# Patient Record
Sex: Female | Born: 1980 | Race: White | Hispanic: No | Smoking: Former smoker
Health system: Southern US, Community
[De-identification: ages and names within clinical notes are randomized; demographics above are authoritative.]

## PROBLEM LIST (undated history)

## (undated) ENCOUNTER — Inpatient Hospital Stay: Payer: Self-pay

## (undated) DIAGNOSIS — Z5189 Encounter for other specified aftercare: Secondary | ICD-10-CM

## (undated) DIAGNOSIS — R102 Pelvic and perineal pain unspecified side: Secondary | ICD-10-CM

## (undated) DIAGNOSIS — K5792 Diverticulitis of intestine, part unspecified, without perforation or abscess without bleeding: Secondary | ICD-10-CM

## (undated) DIAGNOSIS — M419 Scoliosis, unspecified: Secondary | ICD-10-CM

## (undated) DIAGNOSIS — N83209 Unspecified ovarian cyst, unspecified side: Secondary | ICD-10-CM

## (undated) DIAGNOSIS — F329 Major depressive disorder, single episode, unspecified: Secondary | ICD-10-CM

## (undated) DIAGNOSIS — F32A Depression, unspecified: Secondary | ICD-10-CM

## (undated) DIAGNOSIS — K5790 Diverticulosis of intestine, part unspecified, without perforation or abscess without bleeding: Secondary | ICD-10-CM

## (undated) DIAGNOSIS — F419 Anxiety disorder, unspecified: Secondary | ICD-10-CM

## (undated) DIAGNOSIS — J329 Chronic sinusitis, unspecified: Secondary | ICD-10-CM

## (undated) HISTORY — PX: SPINE SURGERY: SHX786

## (undated) HISTORY — DX: Encounter for other specified aftercare: Z51.89

## (undated) HISTORY — DX: Major depressive disorder, single episode, unspecified: F32.9

## (undated) HISTORY — DX: Depression, unspecified: F32.A

## (undated) HISTORY — DX: Pelvic and perineal pain unspecified side: R10.20

## (undated) HISTORY — DX: Chronic sinusitis, unspecified: J32.9

## (undated) HISTORY — DX: Scoliosis, unspecified: M41.9

## (undated) HISTORY — DX: Pelvic and perineal pain: R10.2

---

## 2005-08-20 ENCOUNTER — Emergency Department: Payer: Self-pay | Admitting: Emergency Medicine

## 2006-02-07 ENCOUNTER — Emergency Department: Payer: Self-pay | Admitting: Unknown Physician Specialty

## 2006-08-05 ENCOUNTER — Ambulatory Visit: Payer: Self-pay

## 2006-08-05 IMAGING — US US OB US >=[ID] SNGL FETUS
1 series · 17 of 28 positions shown · non-contrast
Comparison: none

REASON FOR EXAM: size less than dates
COMMENTS:

[Series 1: us ob us >=(id) sngl fetus · 17 of 61 slices shown]
[im 1/61]
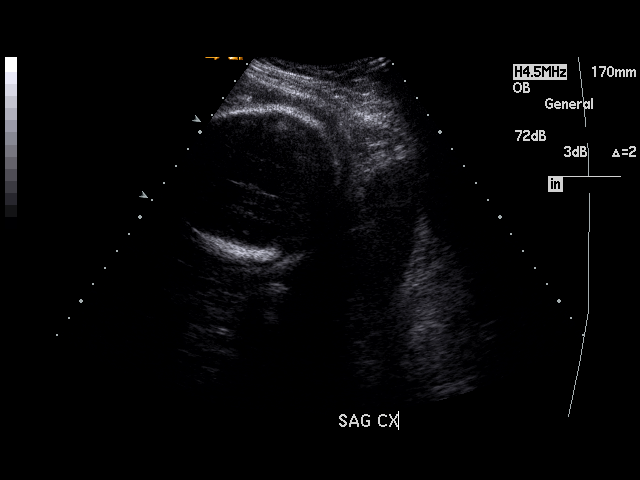
[im 5/61]
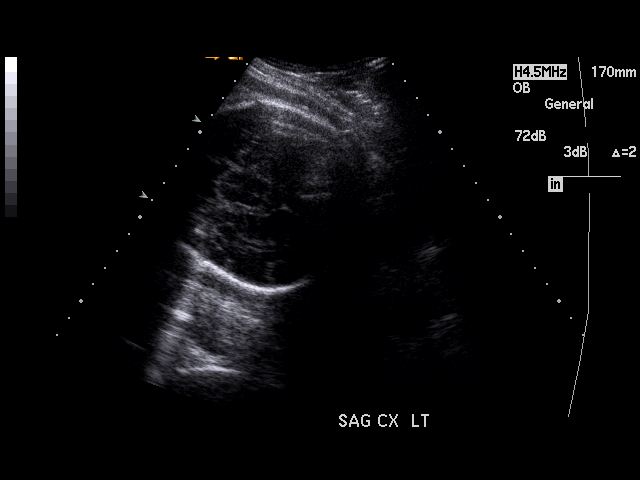
[im 9/61]
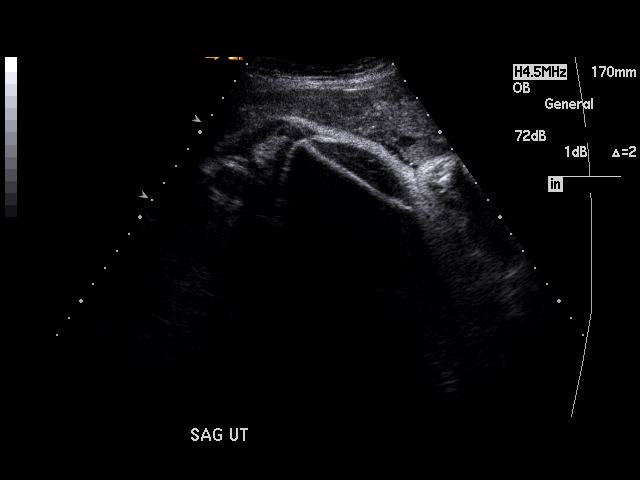
[im 12/61]
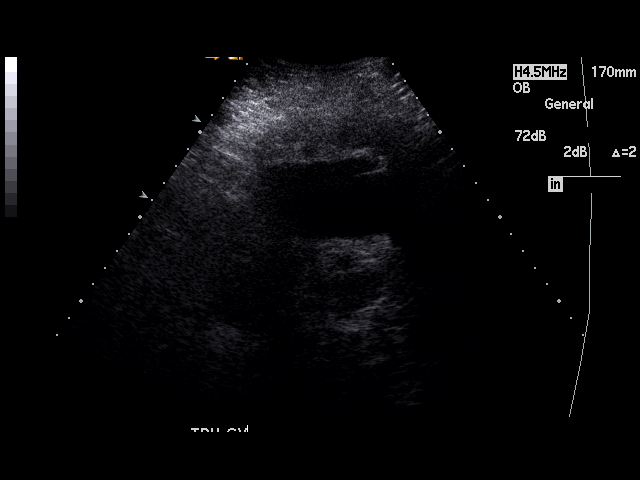
[im 16/61]
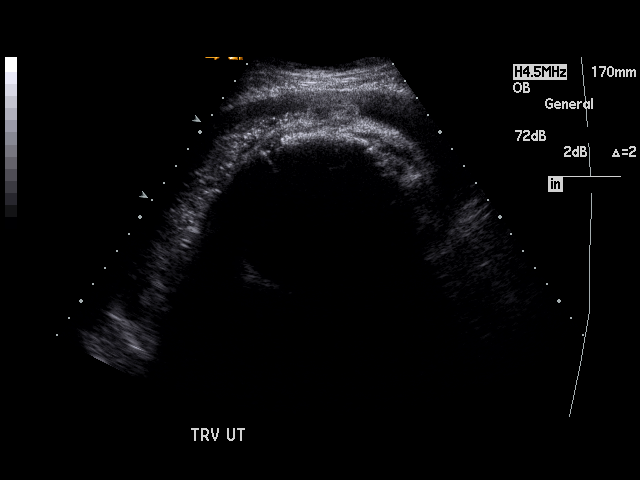
[im 21/61]
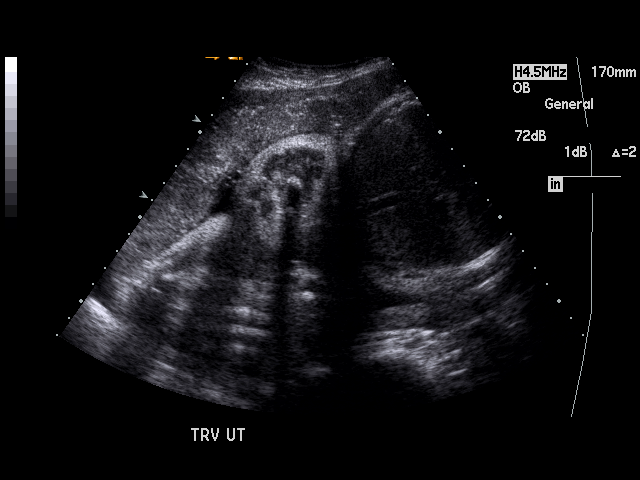
[im 23/61]
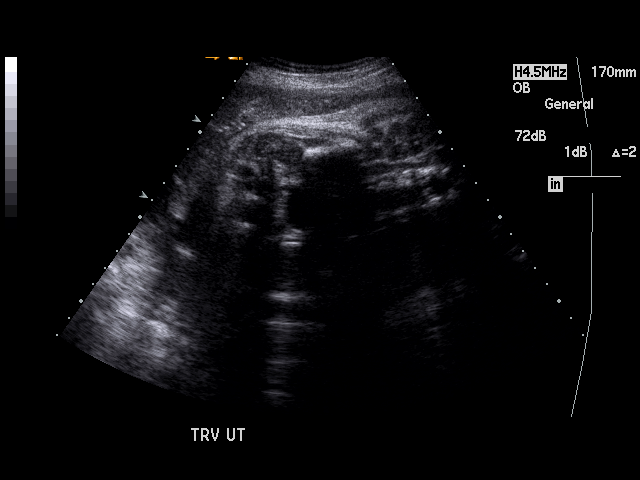
[im 27/61]
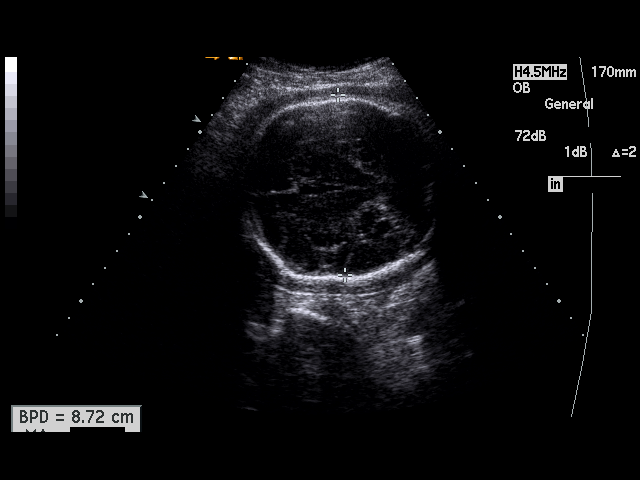
[im 32/61]
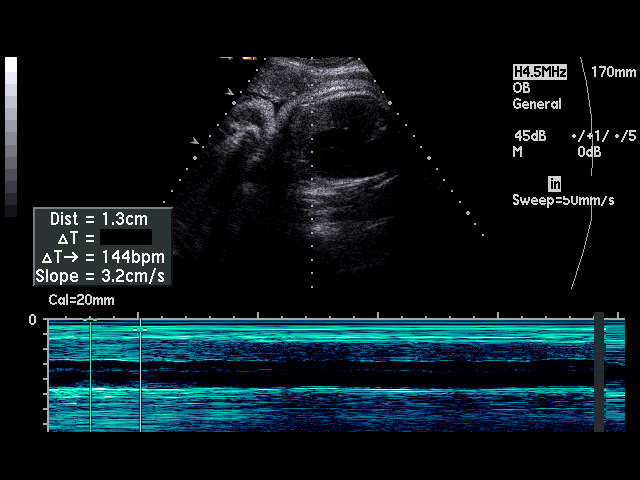
[im 34/61]
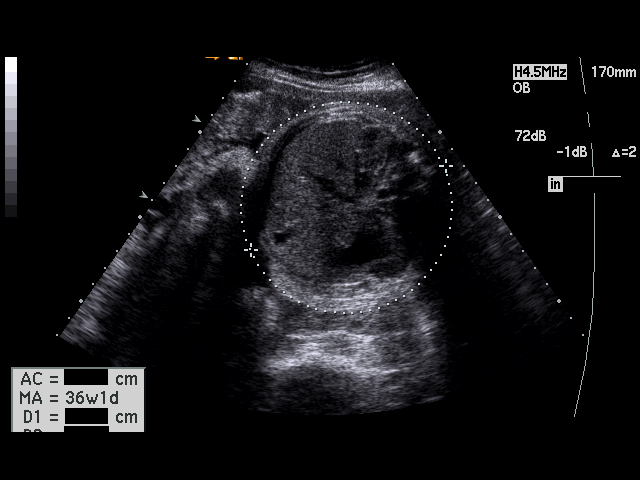
[im 38/61]
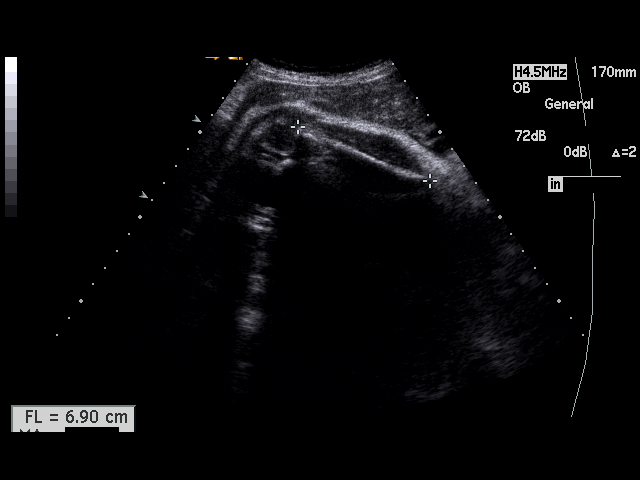
[im 41/61]
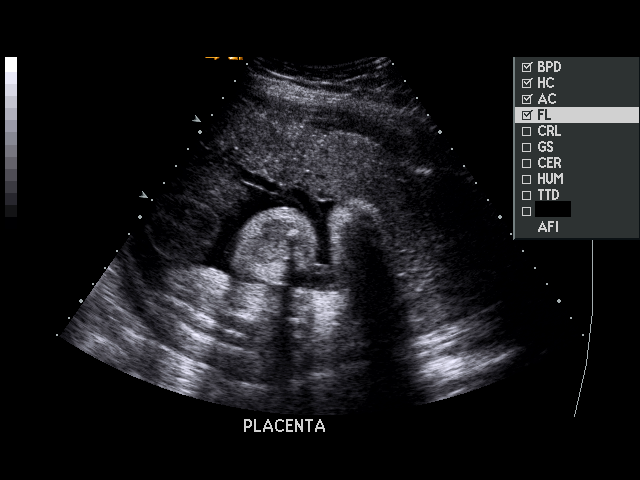
[im 45/61]
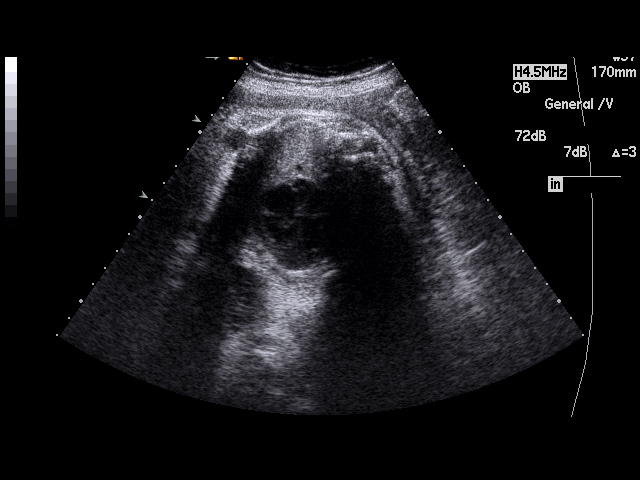
[im 49/61]
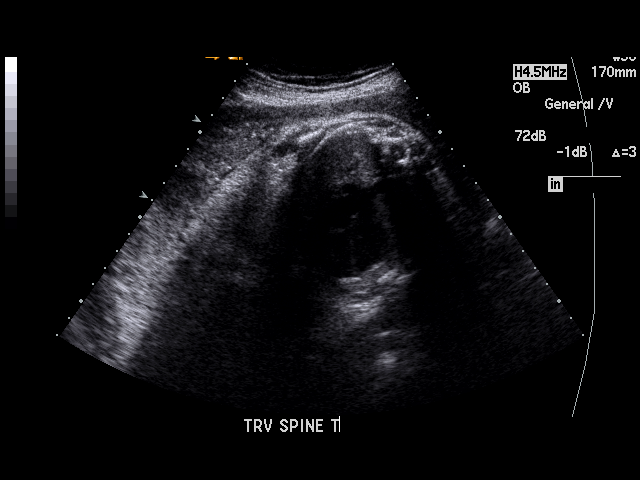
[im 52/61]
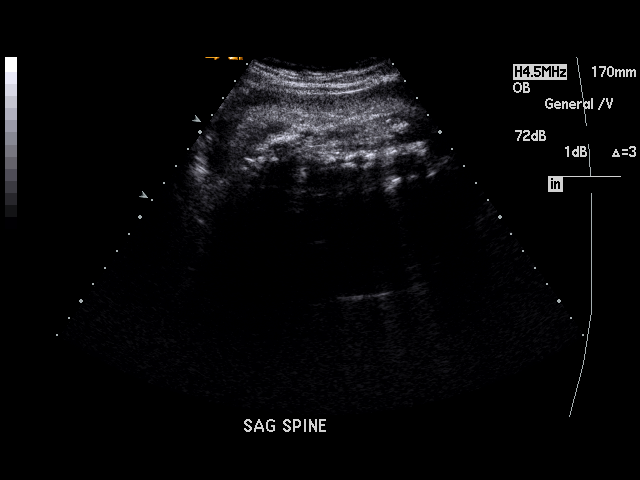
[im 56/61]
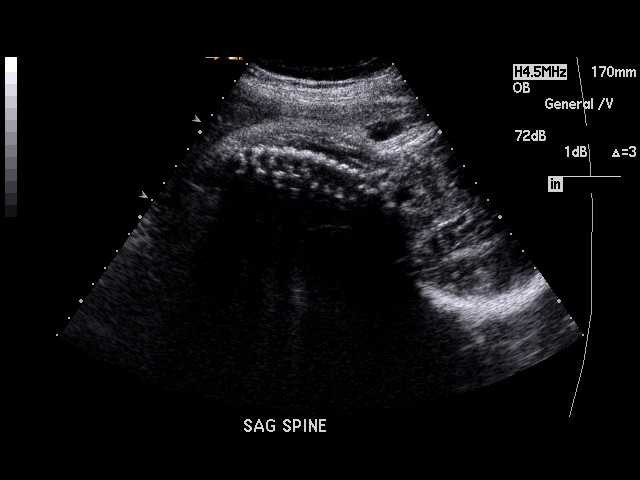
[im 61/61]
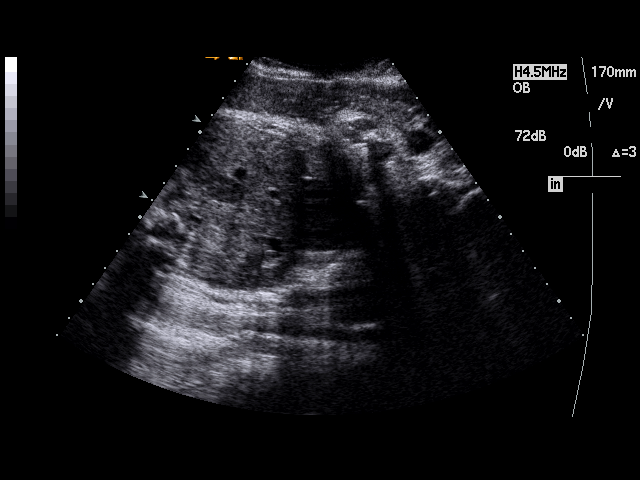

[17 of 28 positions shown; findings below may reference images not displayed]

PROCEDURE:     US  - US OB GREATER/OR EQUAL TO [N0]  - [DATE] [DATE]

RESULT:     There is observed a single living intrauterine gestation.
Presentation currently is cephalic.  The placenta is anterior.  Amniotic
fluid volume is normal to decreased. AFI measures 9.26 cm, which is between
the 5th and 50th percentiles. The fetal heart, stomach, and urinary bladder
are visualized. Fetal cardiac activity was monitored at 144 beats per
minute.  No hydronephrosis or hydrocephalus is seen. Fetal measurements are
as follows:

BPD: 8.70 cm (35 [N0] day)
HC: 32.23 cm (36 [N0] days)
AC: 32.48 cm (36 [N0] days)
FL: 6.94 cm (35 [N0] days)

EFW = 2,824 grams.  AFI is 9.26 cm.  Average ultrasound age is 35 [N0]
days.  Ultrasound EDD is [DATE].
IMPRESSION: 1)See above.

## 2006-08-20 ENCOUNTER — Inpatient Hospital Stay: Payer: Self-pay | Admitting: Obstetrics and Gynecology

## 2006-08-20 ENCOUNTER — Observation Stay: Payer: Self-pay | Admitting: Obstetrics and Gynecology

## 2013-11-16 ENCOUNTER — Ambulatory Visit: Payer: Self-pay | Admitting: Otolaryngology

## 2013-11-16 HISTORY — PX: NASAL SEPTUM SURGERY: SHX37

## 2013-12-09 LAB — HM PAP SMEAR: HM Pap smear: NEGATIVE

## 2014-06-08 ENCOUNTER — Encounter: Payer: Self-pay | Admitting: Family Medicine

## 2014-06-08 DIAGNOSIS — F418 Other specified anxiety disorders: Secondary | ICD-10-CM | POA: Insufficient documentation

## 2014-06-08 DIAGNOSIS — Z5189 Encounter for other specified aftercare: Secondary | ICD-10-CM | POA: Insufficient documentation

## 2014-06-19 ENCOUNTER — Encounter: Payer: Self-pay | Admitting: Physician Assistant

## 2014-06-19 ENCOUNTER — Ambulatory Visit (INDEPENDENT_AMBULATORY_CARE_PROVIDER_SITE_OTHER): Payer: BC Managed Care – PPO | Admitting: Physician Assistant

## 2014-06-19 VITALS — BP 130/86 | HR 84 | Temp 98.6°F | Resp 18 | Ht 60.5 in | Wt 164.0 lb

## 2014-06-19 DIAGNOSIS — E559 Vitamin D deficiency, unspecified: Secondary | ICD-10-CM

## 2014-06-19 DIAGNOSIS — Z23 Encounter for immunization: Secondary | ICD-10-CM

## 2014-06-19 DIAGNOSIS — F329 Major depressive disorder, single episode, unspecified: Secondary | ICD-10-CM

## 2014-06-19 DIAGNOSIS — E669 Obesity, unspecified: Secondary | ICD-10-CM

## 2014-06-19 DIAGNOSIS — F32A Depression, unspecified: Secondary | ICD-10-CM

## 2014-06-19 DIAGNOSIS — Z111 Encounter for screening for respiratory tuberculosis: Secondary | ICD-10-CM

## 2014-06-19 NOTE — Progress Notes (Signed)
Patient ID: Krystal Blackwell MRN: [id number], DOB: [birth year], 33 y.o. Date of Encounter: @DATE @  Chief Complaint:  Chief Complaint  Patient presents with  . new pt est care    needs TB skin test    HPI: 33 y.o. year old white female  presents as a new patient to establish care.  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 States that she was working in [place] until just this year. 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  States that she has an OB/GYN close to her house that she has been seen routinely. States that she just wanted to have a doctor's office at this location where she could come if needed in the future since she works closer to our office.  Says that  it is her OB/GYN who is prescribing all of her current medications including the vitamin D the Wellbutrin and the Adipex.  Regarding the Wellbutrin she says she has been on this for about 1-1/2 years and that it was started for depression. Says that her son is bipolar and now a teenager so she was having some problems with this. States that the Wellbutrin works well for her controlling the symptoms and that it causes no adverse effects.  States that the Adipex had been prescribed back in December but then she was not able to complete it at that time so the GYN just recently restarted in the past month.  States that GYN did full set of labs including lipid panel just a little over one year ago.  States that her tetanus is up-to-date and that she's had this just in the last year or 2.  Has not had an influenza vaccine this season and is agreeable to receive this today.  Also is needing a TB skin test secondary to her job.  No complaints today.   Past Medical History  Diagnosis Date  . Blood transfusion without reported diagnosis     [id number]  . Depression      Home  Meds: Outpatient Prescriptions Prior to Visit  Medication Sig Dispense Refill  . buPROPion (WELLBUTRIN XL) 150 MG 24 hr tablet Take 300 mg by mouth daily. Takes two 150 mg tabs daily     No facility-administered medications prior to visit.    Allergies:  Allergies  Allergen Reactions  . Avelox [Moxifloxacin Hcl In Nacl]     History   Social History  . Marital Status: Legally Separated    Spouse Name: N/A    Number of Children: N/A  . Years of Education: N/A   Occupational History  . Not on file.   Social History Main Topics  . Smoking status: Current Some Day Smoker  . Smokeless tobacco: Never Used  . Alcohol Use: 3.6 oz/week    6 Cans of beer per week  . Drug Use: No  . Sexual Activity: Yes    Birth Control/ Protection: None   Other Topics Concern  . Not on file   Social History Narrative   Entered 06/2014:   Has 2 children--both are boys--one is 69 y/o. The other is "teenager"--not sure exact age.   At home --it is her and her 2 sons.   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    Family History  Problem Relation Age  of Onset  . Asthma Mother   . Hypertension Mother   . Miscarriages / [place] Mother   . Alcohol abuse Father   . Drug abuse Father   . Arthritis Maternal Grandmother      Review of Systems:  See HPI for pertinent ROS. All other ROS negative.    Physical Exam: Blood pressure 130/86, pulse 84, temperature 98.6 F (37 C), temperature source Oral, resp. rate 18, height 5' 0.5" (1.537 m), weight 164 lb (74.39 kg), last menstrual period 06/09/2014., Body mass index is 31.49 kg/(m^2). General: WNWD WF. Appears in no acute distress. Neck: Supple. No thyromegaly. No lymphadenopathy. Lungs: Clear bilaterally to auscultation without wheezes, rales, or rhonchi. Breathing is unlabored. Heart: RRR with S1 S2. No murmurs, rubs, or gallops. Musculoskeletal:  Strength and tone normal for age. Extremities/Skin: Warm and  dry. Neuro: Alert and oriented X 3. Moves all extremities spontaneously. Gait is normal. CNII-XII grossly in tact. Psych:  Responds to questions appropriately with a normal affect.     ASSESSMENT AND PLAN:  33 y.o. year old female with  1. Encounter for PPD test - PPD She is aware to return in 48-72 hours to have this result read.  2. Vitamin D deficiency Managed by GYN  3. Obesity Managed by GYN  4. Depression Managed by GYN  5. She is agreeable to receive influenza vaccine today.  Follow-up as needed.   [address] [place], [place], [facility] 06/19/2014 4:38 PM

## 2014-06-22 ENCOUNTER — Ambulatory Visit: Payer: BC Managed Care – PPO | Admitting: *Deleted

## 2014-06-22 DIAGNOSIS — Z111 Encounter for screening for respiratory tuberculosis: Secondary | ICD-10-CM

## 2014-06-22 LAB — TB SKIN TEST
Induration: 0 mm
TB Skin Test: NEGATIVE

## 2014-06-22 NOTE — Progress Notes (Signed)
Patient ID: Krystal Blackwell, female   DOB: [birth year], 33 y.o.   MRN: [id number] Patient seen in office to have PPD skin test read.   Area noted negative.

## 2014-07-06 ENCOUNTER — Ambulatory Visit (INDEPENDENT_AMBULATORY_CARE_PROVIDER_SITE_OTHER): Payer: BC Managed Care – PPO | Admitting: Physician Assistant

## 2014-07-06 ENCOUNTER — Encounter: Payer: Self-pay | Admitting: Physician Assistant

## 2014-07-06 VITALS — BP 122/82 | HR 64 | Temp 98.6°F | Resp 18 | Wt 165.0 lb

## 2014-07-06 DIAGNOSIS — B9689 Other specified bacterial agents as the cause of diseases classified elsewhere: Secondary | ICD-10-CM

## 2014-07-06 DIAGNOSIS — J988 Other specified respiratory disorders: Secondary | ICD-10-CM

## 2014-07-06 MED ORDER — AZITHROMYCIN 250 MG PO TABS: ORAL_TABLET | ORAL | Status: DC

## 2014-07-06 MED ORDER — HYDROCODONE-HOMATROPINE 5-1.5 MG/5ML PO SYRP: 5.0000 mL | ORAL_SOLUTION | Freq: Three times a day (TID) | ORAL | Status: DC | PRN

## 2014-07-06 NOTE — Progress Notes (Signed)
    Patient ID: Krystal Blackwell MRN: [id number], DOB: [birth year], 33 y.o. Date of Encounter: 07/06/2014, 4:48 PM    Chief Complaint:  Chief Complaint  Patient presents with  . bad chest cold    otc mucinex not helping     HPI: 33 y.o. year old white female with above symptoms. Says that she can feel the phlegm and congestion in her chest but cannot get it to come out even with using Mucinex. Says that now it is really affecting her sleep. Is not getting any mucus from her nose. Says is all in her chest. Has had no fevers or chills. No sore throat or earache.     Home Meds:   Outpatient Prescriptions Prior to Visit  Medication Sig Dispense Refill  . buPROPion (WELLBUTRIN XL) 150 MG 24 hr tablet Take 300 mg by mouth daily. Takes two 150 mg tabs daily    . Vitamin D, Ergocalciferol, (DRISDOL) 50000 UNITS CAPS capsule Take 1 capsule by mouth once a week.  4  . phentermine (ADIPEX-P) 37.5 MG tablet Take 1 tablet by mouth daily. Only takes 1/2 pill  2   No facility-administered medications prior to visit.    Allergies:  Allergies  Allergen Reactions  . Avelox [Moxifloxacin Hcl In Nacl]       Review of Systems: See HPI for pertinent ROS. All other ROS negative.    Physical Exam: Blood pressure 122/82, pulse 64, temperature 98.6 F (37 C), temperature source Oral, resp. rate 18, weight 165 lb (74.844 kg), last menstrual period 07/06/2014., Body mass index is 31.68 kg/(m^2). General:  WNWD WF. Appears in no acute distress. HEENT: Normocephalic, atraumatic, eyes without discharge, sclera non-icteric, nares are without discharge. Bilateral auditory canals clear, TM's are without perforation, pearly grey and translucent with reflective cone of light bilaterally. Oral cavity moist, posterior pharynx without exudate, erythema, peritonsillar abscess.  Neck: Supple. No thyromegaly. No lymphadenopathy. Lungs: Clear bilaterally to auscultation without wheezes, rales, or rhonchi. Breathing  is unlabored. Heart: Regular rhythm. No murmurs, rubs, or gallops. Msk:  Strength and tone normal for age. Extremities/Skin: Warm and dry. Neuro: Alert and oriented X 3. Moves all extremities spontaneously. Gait is normal. CNII-XII grossly in tact. Psych:  Responds to questions appropriately with a normal affect.     ASSESSMENT AND PLAN:  33 y.o. year old female with  1. Bacterial respiratory infection - azithromycin (ZITHROMAX) 250 MG tablet; Day 1: Take 2 daily. Days 2-5: Take 1 daily.  Dispense: 6 tablet; Refill: 0 - HYDROcodone-homatropine (HYCODAN) 5-1.5 MG/5ML syrup; Take 5 mLs by mouth every 8 (eight) hours as needed for cough.  Dispense: 120 mL; Refill: 0  Take the antibiotic as directed and complete all of it. Cautioned that the cough syrup does have codeine and not to take it prior to driving or work. Mostly just uses at night in order to sleep. Follow-up if symptoms do not resolve within 1 week after completion of antibiotic.  [address] [place], [place], [facility] 07/06/2014 4:48 PM

## 2014-07-27 ENCOUNTER — Encounter: Payer: Self-pay | Admitting: Physician Assistant

## 2014-07-27 ENCOUNTER — Ambulatory Visit (INDEPENDENT_AMBULATORY_CARE_PROVIDER_SITE_OTHER): Payer: BC Managed Care – PPO | Admitting: Physician Assistant

## 2014-07-27 VITALS — BP 116/72 | HR 96 | Temp 98.7°F | Resp 18 | Wt 168.0 lb

## 2014-07-27 DIAGNOSIS — B349 Viral infection, unspecified: Secondary | ICD-10-CM

## 2014-07-27 DIAGNOSIS — B9789 Other viral agents as the cause of diseases classified elsewhere: Secondary | ICD-10-CM

## 2014-07-27 DIAGNOSIS — J988 Other specified respiratory disorders: Principal | ICD-10-CM

## 2014-07-27 NOTE — Progress Notes (Signed)
    Patient ID: Krystal Blackwell MRN: [id number], DOB: [birth year], 33 y.o. Date of Encounter: 07/27/2014, 10:37 AM    Chief Complaint:  Chief Complaint  Patient presents with  . sick x 1 day    post nasal drip, cough with green secretion, chills     HPI: 33 y.o. year old white female says that she has actually had these symptoms for about 4 days. Says that it file she thought it was just allergies says she's been taking some Zyrtec-D. However symptoms have only worsened. Has a lot of drainage down her throat. Especially in the mornings she is able to blow out and cough up phlegm/mucus. Says that later during the day she result is not able to cough up any more phlegm. Just in last couple of days her throat started to feel a little irritated. Has had no known fevers.     Home Meds:   Outpatient Prescriptions Prior to Visit  Medication Sig Dispense Refill  . buPROPion (WELLBUTRIN XL) 150 MG 24 hr tablet Take 300 mg by mouth daily. Takes two 150 mg tabs daily    . Vitamin D, Ergocalciferol, (DRISDOL) 50000 UNITS CAPS capsule Take 1 capsule by mouth once a week.  4  . phentermine (ADIPEX-P) 37.5 MG tablet Take 1 tablet by mouth daily. Only takes 1/2 pill  2  . azithromycin (ZITHROMAX) 250 MG tablet Day 1: Take 2 daily. Days 2-5: Take 1 daily. 6 tablet 0  . HYDROcodone-homatropine (HYCODAN) 5-1.5 MG/5ML syrup Take 5 mLs by mouth every 8 (eight) hours as needed for cough. 120 mL 0   No facility-administered medications prior to visit.    Allergies:  Allergies  Allergen Reactions  . Avelox [Moxifloxacin Hcl In Nacl]       Review of Systems: See HPI for pertinent ROS. All other ROS negative.    Physical Exam: Blood pressure 116/72, pulse 96, temperature 98.7 F (37.1 C), temperature source Oral, resp. rate 18, weight 168 lb (76.204 kg), last menstrual period 07/06/2014., Body mass index is 32.26 kg/(m^2). General: WNWD WF.  Appears in no acute distress. HEENT: Normocephalic,  atraumatic, eyes without discharge, sclera non-icteric, nares are without discharge. Bilateral auditory canals clear, TM's are without perforation, pearly grey and translucent with reflective cone of light bilaterally. Oral cavity moist, posterior pharynx without exudate,  peritonsillar abscess. Minimal erythema of the posterior pharynx. Minimal tenderness with percussion of frontal sinus. No tenderness with percussion of  bilateral maxillary sinuses.  Neck: Supple. No thyromegaly. No lymphadenopathy. Lungs: Clear bilaterally to auscultation without wheezes, rales, or rhonchi. Breathing is unlabored. Heart: Regular rhythm. No murmurs, rubs, or gallops. Extremities/Skin: Warm and dry.  No rashes . Neuro: Alert and oriented X 3. Moves all extremities spontaneously. Gait is normal. CNII-XII grossly in tact. Psych:  Responds to questions appropriately with a normal affect.     ASSESSMENT AND PLAN:  33 y.o. year old female with  1. Viral respiratory infection Told her to continue to use over-the-counter decongestants for symptom relief. Follow-up if symptoms worsen significantly or persist greater than 7-10 days.   Signed, [address] [place], [place], [facility] 07/27/2014 10:37 AM

## 2014-07-28 ENCOUNTER — Telehealth: Payer: Self-pay | Admitting: Physician Assistant

## 2014-07-28 NOTE — Telephone Encounter (Signed)
[phone]  Pt is not better she is coughing up green and blowing out green and she is wondering if she needs to come back in or can she have something called in  [id number] [address], [place]

## 2014-07-28 NOTE — Telephone Encounter (Signed)
Received call from patient. Patient made aware of MD recommendations.   Reports that she is not feeling any better and would like advice on OTC medications.   Advised Mucinex DM for cough, Claritin or Zyrtec for nasal drainage, and cepacol for throat tenderness.   Advised to contact office on Monday if worse.

## 2014-07-28 NOTE — Telephone Encounter (Signed)
Saw MBD yesterday.  Likely virus.   Usually takes 7-10 days (it has been 5).  Give time or NTBS if significantly worse.

## 2014-10-06 ENCOUNTER — Encounter: Payer: Self-pay | Admitting: Family Medicine

## 2014-10-06 ENCOUNTER — Ambulatory Visit (INDEPENDENT_AMBULATORY_CARE_PROVIDER_SITE_OTHER): Payer: BLUE CROSS/BLUE SHIELD | Admitting: Family Medicine

## 2014-10-06 VITALS — BP 120/76 | HR 84 | Temp 98.4°F | Resp 18 | Wt 172.0 lb

## 2014-10-06 DIAGNOSIS — J019 Acute sinusitis, unspecified: Secondary | ICD-10-CM

## 2014-10-06 MED ORDER — AMOXICILLIN-POT CLAVULANATE 875-125 MG PO TABS: 1.0000 | ORAL_TABLET | Freq: Two times a day (BID) | ORAL | Status: DC

## 2014-10-06 NOTE — Progress Notes (Signed)
   Subjective:    Patient ID: Krystal Blackwell, female    DOB: December 05, 1980, 34 y.o.   MRN: [id number]  HPI Patient has had a sinus infection for over a week. Symptoms are characterized by pain in both maxillary sinuses, pain in both frontal sinuses, postnasal drip, headaches, and subjective fevers. Past Medical History  Diagnosis Date  . Blood transfusion without reported diagnosis     [id number]  . Depression    Past Surgical History  Procedure Laterality Date  . Spine surgery  97,98,99,00    scoliosis  . Nasal septum surgery  11/2013   Current Outpatient Prescriptions on File Prior to Visit  Medication Sig Dispense Refill  . buPROPion (WELLBUTRIN XL) 150 MG 24 hr tablet Take 300 mg by mouth daily. Takes two 150 mg tabs daily    . Multiple Vitamin (MULTIVITAMIN) tablet Take 1 tablet by mouth daily.    . Vitamin D, Ergocalciferol, (DRISDOL) 50000 UNITS CAPS capsule Take 1 capsule by mouth once a week.  4  . phentermine (ADIPEX-P) 37.5 MG tablet Take 1 tablet by mouth daily. Only takes 1/2 pill  2   No current facility-administered medications on file prior to visit.   Allergies  Allergen Reactions  . Avelox [Moxifloxacin Hcl In Nacl]    History   Social History  . Marital Status: Single    Spouse Name: N/A  . Number of Children: N/A  . Years of Education: N/A   Occupational History  . Not on file.   Social History Main Topics  . Smoking status: Current Some Day Smoker  . Smokeless tobacco: Never Used  . Alcohol Use: 3.6 oz/week    6 Cans of beer per week  . Drug Use: No  . Sexual Activity: Yes    Birth Control/ Protection: None   Other Topics Concern  . Not on file   Social History Narrative   Entered 06/2014:   Has 2 children--both are boys--one is 42 y/o. The other is "teenager"--not sure exact age.   At home --it is her and her 2 sons.   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      Review of Systems  All other systems reviewed  and are negative.      Objective:   Physical Exam  HENT:  Right Ear: External ear normal.  Left Ear: External ear normal.  Nose: Mucosal edema and rhinorrhea present. Right sinus exhibits maxillary sinus tenderness and frontal sinus tenderness. Left sinus exhibits maxillary sinus tenderness and frontal sinus tenderness.  Mouth/Throat: Oropharynx is clear and moist. No oropharyngeal exudate.  Neck: Neck supple.  Cardiovascular: Normal rate, regular rhythm and normal heart sounds.   Pulmonary/Chest: Effort normal and breath sounds normal.  Vitals reviewed.         Assessment & Plan:  Acute rhinosinusitis - Plan: amoxicillin-clavulanate (AUGMENTIN) 875-125 MG per tablet  Begin Augmentin 875 mg by mouth twice a day for 10 days

## 2014-12-15 ENCOUNTER — Emergency Department (HOSPITAL_COMMUNITY)
Admission: EM | Admit: 2014-12-15 | Discharge: 2014-12-15 | Disposition: A | Discharge status: Home / Self Care | Payer: Managed Care, Other (non HMO) | Attending: Emergency Medicine | Admitting: Emergency Medicine

## 2014-12-15 ENCOUNTER — Encounter (HOSPITAL_COMMUNITY): Payer: Self-pay

## 2014-12-15 DIAGNOSIS — Z3202 Encounter for pregnancy test, result negative: Secondary | ICD-10-CM | POA: Diagnosis not present

## 2014-12-15 DIAGNOSIS — F329 Major depressive disorder, single episode, unspecified: Secondary | ICD-10-CM | POA: Insufficient documentation

## 2014-12-15 DIAGNOSIS — N938 Other specified abnormal uterine and vaginal bleeding: Secondary | ICD-10-CM | POA: Diagnosis not present

## 2014-12-15 DIAGNOSIS — R102 Pelvic and perineal pain: Secondary | ICD-10-CM

## 2014-12-15 DIAGNOSIS — Z79899 Other long term (current) drug therapy: Secondary | ICD-10-CM | POA: Insufficient documentation

## 2014-12-15 DIAGNOSIS — Z720 Tobacco use: Secondary | ICD-10-CM | POA: Insufficient documentation

## 2014-12-15 DIAGNOSIS — Z792 Long term (current) use of antibiotics: Secondary | ICD-10-CM | POA: Diagnosis not present

## 2014-12-15 LAB — URINALYSIS, ROUTINE W REFLEX MICROSCOPIC
Bilirubin Urine: NEGATIVE
Glucose, UA: NEGATIVE mg/dL
Ketones, ur: NEGATIVE mg/dL
Leukocytes, UA: NEGATIVE
Nitrite: NEGATIVE
Protein, ur: NEGATIVE mg/dL
Specific Gravity, Urine: 1.006 (ref 1.005–1.030)
Urobilinogen, UA: 0.2 mg/dL (ref 0.0–1.0)
pH: 6.5 (ref 5.0–8.0)

## 2014-12-15 LAB — CBC WITH DIFFERENTIAL/PLATELET
Basophils Absolute: 0 10*3/uL (ref 0.0–0.1)
Basophils Relative: 0 % (ref 0–1)
Eosinophils Absolute: 0.1 10*3/uL (ref 0.0–0.7)
Eosinophils Relative: 1 % (ref 0–5)
HCT: 34.7 % — ABNORMAL LOW (ref 36.0–46.0)
Hemoglobin: 11.4 g/dL — ABNORMAL LOW (ref 12.0–15.0)
Lymphocytes Relative: 27 % (ref 12–46)
Lymphs Abs: 2.1 10*3/uL (ref 0.7–4.0)
MCH: 30.8 pg (ref 26.0–34.0)
MCHC: 32.9 g/dL (ref 30.0–36.0)
MCV: 93.8 fL (ref 78.0–100.0)
Monocytes Absolute: 0.5 10*3/uL (ref 0.1–1.0)
Monocytes Relative: 7 % (ref 3–12)
Neutro Abs: 5 10*3/uL (ref 1.7–7.7)
Neutrophils Relative %: 65 % (ref 43–77)
Platelets: 383 10*3/uL (ref 150–400)
RBC: 3.7 MIL/uL — ABNORMAL LOW (ref 3.87–5.11)
RDW: 13.5 % (ref 11.5–15.5)
WBC: 7.7 10*3/uL (ref 4.0–10.5)

## 2014-12-15 LAB — PREGNANCY, URINE: Preg Test, Ur: NEGATIVE

## 2014-12-15 LAB — COMPREHENSIVE METABOLIC PANEL
ALT: 13 U/L (ref 0–35)
AST: 15 U/L (ref 0–37)
Albumin: 4.3 g/dL (ref 3.5–5.2)
Alkaline Phosphatase: 45 U/L (ref 39–117)
Anion gap: 9 (ref 5–15)
BUN: 10 mg/dL (ref 6–23)
CO2: 21 mmol/L (ref 19–32)
Calcium: 8.6 mg/dL (ref 8.4–10.5)
Chloride: 109 mmol/L (ref 96–112)
Creatinine, Ser: 0.75 mg/dL (ref 0.50–1.10)
GFR calc Af Amer: >90 mL/min (ref >90)
GFR calc non Af Amer: >90 mL/min (ref >90)
Glucose, Bld: 95 mg/dL (ref 70–99)
Potassium: 3.6 mmol/L (ref 3.5–5.1)
Sodium: 139 mmol/L (ref 135–145)
Total Bilirubin: 0.5 mg/dL (ref 0.3–1.2)
Total Protein: 7.2 g/dL (ref 6.0–8.3)

## 2014-12-15 LAB — WET PREP, GENITAL
Clue Cells Wet Prep HPF POC: NONE SEEN
Trich, Wet Prep: NONE SEEN
WBC, Wet Prep HPF POC: NONE SEEN
Yeast Wet Prep HPF POC: NONE SEEN

## 2014-12-15 LAB — URINE MICROSCOPIC-ADD ON

## 2014-12-15 MED ORDER — IBUPROFEN 400 MG PO TABS: 400.0000 mg | ORAL_TABLET | Freq: Four times a day (QID) | ORAL | Status: DC | PRN

## 2014-12-15 NOTE — ED Notes (Signed)
Bed: XI33 Expected date:  Expected time:  Means of arrival:  Comments: EMS-heavy period

## 2014-12-15 NOTE — Discharge Instructions (Signed)
Abdominal Pain, Women °Abdominal (stomach, pelvic, or belly) pain can be caused by many things. It is important to tell your doctor: °· The location of the pain. °· Does it come and go or is it present all the time? °· Are there things that start the pain (eating certain foods, exercise)? °· Are there other symptoms associated with the pain (fever, nausea, vomiting, diarrhea)? °All of this is helpful to know when trying to find the cause of the pain. °CAUSES  °· Stomach: virus or bacteria infection, or ulcer. °· Intestine: appendicitis (inflamed appendix), regional ileitis (Crohn's disease), ulcerative colitis (inflamed colon), irritable bowel syndrome, diverticulitis (inflamed diverticulum of the colon), or cancer of the stomach or intestine. °· Gallbladder disease or stones in the gallbladder. °· Kidney disease, kidney stones, or infection. °· Pancreas infection or cancer. °· Fibromyalgia (pain disorder). °· Diseases of the female organs: °· Uterus: fibroid (non-cancerous) tumors or infection. °· Fallopian tubes: infection or tubal pregnancy. °· Ovary: cysts or tumors. °· Pelvic adhesions (scar tissue). °· Endometriosis (uterus lining tissue growing in the pelvis and on the pelvic organs). °· Pelvic congestion syndrome (female organs filling up with blood just before the menstrual period). °· Pain with the menstrual period. °· Pain with ovulation (producing an egg). °· Pain with an IUD (intrauterine device, birth control) in the uterus. °· Cancer of the female organs. °· Functional pain (pain not caused by a disease, may improve without treatment). °· Psychological pain. °· Depression. °DIAGNOSIS  °Your doctor will decide the seriousness of your pain by doing an examination. °· Blood tests. °· X-rays. °· Ultrasound. °· CT scan (computed tomography, special type of X-ray). °· MRI (magnetic resonance imaging). °· Cultures, for infection. °· Barium enema (dye inserted in the large intestine, to better view it with  X-rays). °· Colonoscopy (looking in intestine with a lighted tube). °· Laparoscopy (minor surgery, looking in abdomen with a lighted tube). °· Major abdominal exploratory surgery (looking in abdomen with a large incision). °TREATMENT  °The treatment will depend on the cause of the pain.  °· Many cases can be observed and treated at home. °· Over-the-counter medicines recommended by your caregiver. °· Prescription medicine. °· Antibiotics, for infection. °· Birth control pills, for painful periods or for ovulation pain. °· Hormone treatment, for endometriosis. °· Nerve blocking injections. °· Physical therapy. °· Antidepressants. °· Counseling with a psychologist or psychiatrist. °· Minor or major surgery. °HOME CARE INSTRUCTIONS  °· Do not take laxatives, unless directed by your caregiver. °· Take over-the-counter pain medicine only if ordered by your caregiver. Do not take aspirin because it can cause an upset stomach or bleeding. °· Try a clear liquid diet (broth or water) as ordered by your caregiver. Slowly move to a bland diet, as tolerated, if the pain is related to the stomach or intestine. °· Have a thermometer and take your temperature several times a day, and record it. °· Bed rest and sleep, if it helps the pain. °· Avoid sexual intercourse, if it causes pain. °· Avoid stressful situations. °· Keep your follow-up appointments and tests, as your caregiver orders. °· If the pain does not go away with medicine or surgery, you may try: °· Acupuncture. °· Relaxation exercises (yoga, meditation). °· Group therapy. °· Counseling. °SEEK MEDICAL CARE IF:  °· You notice certain foods cause stomach pain. °· Your home care treatment is not helping your pain. °· You need stronger pain medicine. °· You want your IUD removed. °· You feel faint or   lightheaded.  You develop nausea and vomiting.  You develop a rash.  You are having side effects or an allergy to your medicine. SEEK IMMEDIATE MEDICAL CARE IF:   Your  pain does not go away or gets worse.  You have a fever.  Your pain is felt only in portions of the abdomen. The right side could possibly be appendicitis. The left lower portion of the abdomen could be colitis or diverticulitis.  You are passing blood in your stools (bright red or black tarry stools, with or without vomiting).  You have blood in your urine.  You develop chills, with or without a fever.  You pass out. MAKE SURE YOU:   Understand these instructions.  Will watch your condition.  Will get help right away if you are not doing well or get worse. Document Released: 06/01/2007 Document Revised: 12/19/2013 Document Reviewed: 06/21/2009 [facility] Patient Information 2015 [place], [place]. This information is not intended to replace advice given to you by your health care provider. Make sure you discuss any questions you have with your health care provider.  Abnormal Uterine Bleeding Abnormal uterine bleeding can affect women at various stages in life, including teenagers, women in their reproductive years, pregnant women, and women who have reached menopause. Several kinds of uterine bleeding are considered abnormal, including:  Bleeding or spotting between periods.   Bleeding after sexual intercourse.   Bleeding that is heavier or more than normal.   Periods that last longer than usual.  Bleeding after menopause.  Many cases of abnormal uterine bleeding are minor and simple to treat, while others are more serious. Any type of abnormal bleeding should be evaluated by your health care provider. Treatment will depend on the cause of the bleeding. HOME CARE INSTRUCTIONS Monitor your condition for any changes. The following actions may help to alleviate any discomfort you are experiencing:  Avoid the use of tampons and douches as directed by your health care provider.  Change your pads frequently. You should get regular pelvic exams and Pap tests. Keep all follow-up  appointments for diagnostic tests as directed by your health care provider.  SEEK MEDICAL CARE IF:   Your bleeding lasts more than 1 week.   You feel dizzy at times.  SEEK IMMEDIATE MEDICAL CARE IF:   You pass out.   You are changing pads every 15 to 30 minutes.   You have abdominal pain.  You have a fever.   You become sweaty or weak.   You are passing large blood clots from the vagina.   You start to feel nauseous and vomit. MAKE SURE YOU:   Understand these instructions.  Will watch your condition.  Will get help right away if you are not doing well or get worse. Document Released: 08/04/2005 Document Revised: 08/09/2013 Document Reviewed: 03/03/2013 [facility] Patient Information 2015 [place], [place]. This information is not intended to replace advice given to you by your health care provider. Make sure you discuss any questions you have with your health care provider.  You were evaluated in the ED today for your abdominal discomfort and dizziness. There does not appear to be an emergent cause for your symptoms at this time. It is important. Follow-up with your primary care/GYN for further evaluation and management of your symptoms. You may use your Motrin as needed for abdominal discomfort. Return to ED for new or worsening symptoms.

## 2014-12-15 NOTE — ED Provider Notes (Signed)
CSN: [id number]     Arrival date & time 12/15/14  1220 History   First MD Initiated Contact with Patient 12/15/14 1240     Chief Complaint  Patient presents with  . Near Syncope  . Vaginal Bleeding     (Consider location/radiation/quality/duration/timing/severity/associated sxs/prior Treatment) HPI Krystal Blackwell is a 34 y.o. 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 Patient states yesterday she bled a lot more than normal.  Reports she does have a GYN, but only sees them yearly. Today at approximately 9:00 this morning she had a sudden onset left lower abdominal, sharp cramping sensation rated as a 10/10. Patient reports this episode lasted for 27 minutes. The pain resolved on its own without intervention. At approximately 11:00 this morning she was standing outside when she suddenly felt hot, dizzy and a tingling sensation in her fingers and toes. She also reports associated nausea without vomiting. Denies any fevers, urinary symptoms, back pain, syncope, head trauma, numbness or weakness. Past Medical History  Diagnosis Date  . Blood transfusion without reported diagnosis     8563,[id number]  . Depression    Past Surgical History  Procedure Laterality Date  . Spine surgery  97,98,99,00    scoliosis  . Nasal septum surgery  11/2013   Family History  Problem Relation Age of Onset  . Asthma Mother   . Hypertension Mother   . Miscarriages / [place] Mother   . Alcohol abuse Father   . Drug abuse Father   . Arthritis Maternal Grandmother    History  Substance Use Topics  . Smoking status: Current Some Day Smoker  . Smokeless tobacco: Never Used  . Alcohol Use: 3.6 oz/week    6 Cans of beer per week   OB History    Gravida Para Term Preterm AB TAB SAB Ectopic Multiple Living   2 2             Review of Systems A 10 point review of systems was completed and was negative except for pertinent  positives and negatives as mentioned in the history of present illness     Allergies  Avelox  Home Medications   Prior to Admission medications   Medication Sig Start Date End Date Taking? Authorizing Provider  buPROPion (WELLBUTRIN XL) 150 MG 24 hr tablet Take 300 mg by mouth daily. Takes two 150 mg tabs daily   Yes Historical Provider, MD  Cyanocobalamin (B-12 COMPLIANCE INJECTION IJ) Inject 1 mL as directed every 30 (thirty) days.   Yes Historical Provider, MD  Multiple Vitamin (MULTIVITAMIN) tablet Take 1 tablet by mouth daily.   Yes Historical Provider, MD  amoxicillin-clavulanate (AUGMENTIN) 875-125 MG per tablet Take 1 tablet by mouth 2 (two) times daily. Patient not taking: Reported on 12/15/2014 10/06/14   Susy Frizzle, MD  ibuprofen (ADVIL,MOTRIN) 400 MG tablet Take 1 tablet (400 mg total) by mouth every 6 (six) hours as needed. 12/15/14   Comer Locket, PA-C  Vitamin D, Ergocalciferol, (DRISDOL) 50000 UNITS CAPS capsule Take 1 capsule by mouth once a week. 05/28/14   Historical Provider, MD   BP 136/77 mmHg  Pulse 81  Temp(Src) 97.9 F (36.6 C) (Oral)  Resp 18  Ht 5\' 1"  (1.549 m)  Wt 155 lb (70.308 kg)  BMI 29.30 kg/m2  SpO2 98%  LMP 12/08/2014 Physical Exam  Constitutional: She is oriented to person, place, and time. She appears well-developed and well-nourished.  HENT:  Head: Normocephalic and atraumatic.  Mouth/Throat: Oropharynx is clear and moist.  Eyes: Conjunctivae are normal. Pupils are equal, round, and reactive to light. Right eye exhibits no discharge. Left eye exhibits no discharge. No scleral icterus.  Neck: Neck supple.  Cardiovascular: Normal rate, regular rhythm and normal heart sounds.   Pulmonary/Chest: Effort normal and breath sounds normal. No respiratory distress. She has no wheezes. She has no rales.  Abdominal: Soft. There is no tenderness.  Genitourinary:  Chaperone was present for the entire genital exam. No lesions or rashes  appreciated on vulva. Cervix visualized on speculum exam and appropriate cultures sampled. copious blood in vaginal vault. Discharge: None appreciated Upon bi manual exam- No TTP of the adnexa, no cervical motion tenderness. No fullness or masses appreciated. No abnormalities appreciated in structural anatomy.   Musculoskeletal: She exhibits no tenderness.  Neurological: She is alert and oriented to person, place, and time.  Cranial Nerves II-XII grossly intact  Skin: Skin is warm and dry. No rash noted.  Psychiatric: She has a normal mood and affect.  Nursing note and vitals reviewed.   ED Course  Procedures (including critical care time) Labs Review Labs Reviewed  CBC WITH DIFFERENTIAL/PLATELET - Abnormal; Notable for the following:    RBC 3.70 (*)    Hemoglobin 11.4 (*)    HCT 34.7 (*)    All other components within normal limits  URINALYSIS, ROUTINE W REFLEX MICROSCOPIC - Abnormal; Notable for the following:    Hgb urine dipstick LARGE (*)    All other components within normal limits  WET PREP, GENITAL  COMPREHENSIVE METABOLIC PANEL  PREGNANCY, URINE  URINE MICROSCOPIC-ADD ON  HIV ANTIBODY (ROUTINE TESTING)  GC/CHLAMYDIA PROBE AMP ([place])    Imaging Review No results found.   EKG Interpretation   Date/Time:  Friday December 15 2014 12:29:41 EDT Ventricular Rate:  85 PR Interval:  117 QRS Duration: 110 QT Interval:  414 QTC Calculation: 492 R Axis:   80 Text Interpretation:  Sinus rhythm Borderline short PR interval Borderline  prolonged QT interval Confirmed by Lacinda Axon  MD, BRIAN ([id number]) on 12/15/2014  12:53:44 PM     Meds given in ED:  Medications - No data to display  New Prescriptions   IBUPROFEN (ADVIL,MOTRIN) 400 MG TABLET    Take 1 tablet (400 mg total) by mouth every 6 (six) hours as needed.   Filed Vitals:   12/15/14 1235 12/15/14 1417 12/15/14 1430 12/15/14 1530  BP: 131/77 130/75 127/78 136/77  Pulse: 83 80 96 81  Temp: 97.9 F (36.6 C)  97.9 F (36.6 C)    TempSrc: Oral Oral    Resp: 20 18    Height: 5\' 1"  (1.549 m)     Weight: 155 lb (70.308 kg)     SpO2: 99% 99% 100% 98%    MDM  Vitals stable - WNL -afebrile Pt resting comfortably in ED. PE--grossly benign physical exam, normal abdominal exam, reassuring pelvic exam. Labwork--labs are essentially noncontributory. Hemoglobin 11.4. EKG is reassuring. Large hemoglobin on urinalysis consistent with patient's current menstruation.negative pregnancy  DDX--patient denies any discomfort at this time.exam today not concerning for other acute or emergent pathology. Discussed we'll need to follow with primary care and/or GYN for further evaluation and management of symptoms. Will DC with Motrin for lower abdominal cramping. I discussed all relevant lab findings and imaging results with pt and they verbalized understanding. Discussed f/u with PCP within 48 hrs and return precautions, pt very amenable to plan.  Prior  to patient sign out, I discussed and reviewed this case with Dr.Cook  Patient care signed out to Noland Fordyce, PA-C for follow-up on wet prep. Provided no new objective findings I feel patient is stable to be discharged and follow up with her PCP/GYN for further evaluation and management of symptoms. Low concern at this point for ectopic, TOA, torsion or other pelvic or intra-abdominal pathology.    Final diagnoses:  Pelvic pain in [place]  Dysfunctional uterine bleeding      Comer Locket, PA-C 12/15/14 [place], MD 12/16/14 [phone]

## 2014-12-15 NOTE — ED Provider Notes (Signed)
4:56 PM Pt signed out at shift change by Comer Locket, PA-C at shift change. Plan is to f/u on Wet prep, treat appropriately.  Pt is a 34yo female with hx of heavy menstrual cycles, presented to ED with reports of an episode of lower abdominal pain that resolved PTA, associated near syncopal episode.  On exam, pt has soft, non-tender abdomen. Appears well, non-toxic, afebrile.  Vitals: WNL.  Labs: Hgb- 11.4, otherwise unremarkable.  Results for orders placed or performed during the hospital encounter of 12/15/14  Wet prep, genital  Result Value Ref Range   Yeast Wet Prep HPF POC NONE SEEN NONE SEEN   Trich, Wet Prep NONE SEEN NONE SEEN   Clue Cells Wet Prep HPF POC NONE SEEN NONE SEEN   WBC, Wet Prep HPF POC NONE SEEN NONE SEEN  CBC with Differential  Result Value Ref Range   WBC 7.7 4.0 - 10.5 K/uL   RBC 3.70 (L) 3.87 - 5.11 MIL/uL   Hemoglobin 11.4 (L) 12.0 - 15.0 g/dL   HCT 34.7 (L) 36.0 - 46.0 %   MCV 93.8 78.0 - 100.0 fL   MCH 30.8 26.0 - 34.0 pg   MCHC 32.9 30.0 - 36.0 g/dL   RDW 13.5 11.5 - 15.5 %   Platelets 383 150 - 400 K/uL   Neutrophils Relative % 65 43 - 77 %   Neutro Abs 5.0 1.7 - 7.7 K/uL   Lymphocytes Relative 27 12 - 46 %   Lymphs Abs 2.1 0.7 - 4.0 K/uL   Monocytes Relative 7 3 - 12 %   Monocytes Absolute 0.5 0.1 - 1.0 K/uL   Eosinophils Relative 1 0 - 5 %   Eosinophils Absolute 0.1 0.0 - 0.7 K/uL   Basophils Relative 0 0 - 1 %   Basophils Absolute 0.0 0.0 - 0.1 K/uL  Comprehensive metabolic panel  Result Value Ref Range   Sodium 139 135 - 145 mmol/L   Potassium 3.6 3.5 - 5.1 mmol/L   Chloride 109 96 - 112 mmol/L   CO2 21 19 - 32 mmol/L   Glucose, Bld 95 70 - 99 mg/dL   BUN 10 6 - 23 mg/dL   Creatinine, Ser 0.75 0.50 - 1.10 mg/dL   Calcium 8.6 8.4 - 10.5 mg/dL   Total Protein 7.2 6.0 - 8.3 g/dL   Albumin 4.3 3.5 - 5.2 g/dL   AST 15 0 - 37 U/L   ALT 13 0 - 35 U/L   Alkaline Phosphatase 45 39 - 117 U/L   Total Bilirubin 0.5 0.3 - 1.2 mg/dL   GFR calc  non Af Amer >90 >90 mL/min   GFR calc Af Amer >90 >90 mL/min   Anion gap 9 5 - 15  Urinalysis, Routine w reflex microscopic  Result Value Ref Range   Color, Urine YELLOW YELLOW   APPearance CLEAR CLEAR   Specific Gravity, Urine 1.006 1.005 - 1.030   pH 6.5 5.0 - 8.0   Glucose, UA NEGATIVE NEGATIVE mg/dL   Hgb urine dipstick LARGE (A) NEGATIVE   Bilirubin Urine NEGATIVE NEGATIVE   Ketones, ur NEGATIVE NEGATIVE mg/dL   Protein, ur NEGATIVE NEGATIVE mg/dL   Urobilinogen, UA 0.2 0.0 - 1.0 mg/dL   Nitrite NEGATIVE NEGATIVE   Leukocytes, UA NEGATIVE NEGATIVE  Pregnancy, urine  Result Value Ref Range   Preg Test, Ur NEGATIVE NEGATIVE  Urine microscopic-add on  Result Value Ref Range   Squamous Epithelial / LPF RARE RARE   RBC / HPF  11-20 <3 RBC/hpf   Bacteria, UA RARE RARE   Wet prep: normal Pt declined HIV testing as she states she is not concerned for STDs.  Discontinued test as pt became upset about having blood drawn for HIV.   Pt also requested more information on iron supplements.  Printed pt information about iron-rich diet.  Pt does have an OB/GYN.  Strongly encouraged pt to f/u with OB/GYN for further evaluation and continued management of pelvic pain and heavy cycles. Return precautions provided. Pt verbalized understanding and agreement with tx plan.       Noland Fordyce, PA-C 12/15/14 [place] Yao, MD 12/16/14 1311

## 2014-12-15 NOTE — ED Notes (Signed)
Pt alert x4 respirations easy non labored skin w/d

## 2014-12-15 NOTE — ED Notes (Signed)
Bib ems related to abdominal pain, vaginal bleeding and near syncopal episode onset today. Pt currently on mensis with increase in cramping and bleeding. Pt states she was standing and felt faint. Pt did not faint. Per ems pt had weak radial pulse on arrival which improved with 225 ns. Alert x4 respirations easy non labored. MAE randomly

## 2014-12-18 LAB — GC/CHLAMYDIA PROBE AMP (~~LOC~~) NOT AT ARMC
Chlamydia: NEGATIVE
Neisseria Gonorrhea: NEGATIVE

## 2015-01-23 ENCOUNTER — Telehealth: Payer: Self-pay | Admitting: *Deleted

## 2015-01-23 ENCOUNTER — Encounter: Payer: Self-pay | Admitting: Family Medicine

## 2015-01-23 ENCOUNTER — Other Ambulatory Visit: Payer: Self-pay | Admitting: *Deleted

## 2015-01-23 ENCOUNTER — Ambulatory Visit (INDEPENDENT_AMBULATORY_CARE_PROVIDER_SITE_OTHER): Payer: BC Managed Care – PPO | Admitting: Family Medicine

## 2015-01-23 VITALS — BP 124/70 | HR 76 | Temp 98.3°F | Resp 18 | Ht 62.0 in | Wt 169.0 lb

## 2015-01-23 DIAGNOSIS — J0100 Acute maxillary sinusitis, unspecified: Secondary | ICD-10-CM | POA: Diagnosis not present

## 2015-01-23 MED ORDER — VITAMIN D (ERGOCALCIFEROL) 1.25 MG (50000 UNIT) PO CAPS: 50000.0000 [IU] | ORAL_CAPSULE | ORAL | Status: DC

## 2015-01-23 MED ORDER — AMOXICILLIN-POT CLAVULANATE 875-125 MG PO TABS: 1.0000 | ORAL_TABLET | Freq: Two times a day (BID) | ORAL | Status: DC

## 2015-01-23 NOTE — Telephone Encounter (Signed)
Pt isnt taking b-12 at this time, rx declined

## 2015-01-23 NOTE — Patient Instructions (Signed)
Continue current medications Continue decongestant Take antibiotics as prescribed  F/U as needed

## 2015-01-23 NOTE — Telephone Encounter (Signed)
Pt requested refill on her vit d, rx sent to pharmacy

## 2015-01-23 NOTE — Progress Notes (Signed)
Patient ID: Krystal Blackwell, female   DOB: 07/25/1981, 34 y.o.   MRN: [id number]   Subjective:    Patient ID: Krystal Blackwell, female    DOB: [birth year], 34 y.o.   MRN: [id number]  Patient presents for Illness  Pt here with recurrent sinusitis symptoms, history of long term sinus issues, sinus surgery/septoplasty done in the past. She does not get as many infections and previously since surgery. SYmptoms started 9-10 days ago, has been using OTC decongestants, nasal steroid and rinses as tolerated but no improvement, no fever, +headache behind eyes/sinus region, no cough, no sick contacts     Review Of Systems:  GEN- denies fatigue, fever, weight loss,weakness, recent illness HEENT- denies eye drainage, change in vision, +nasal discharge, CVS- denies chest pain, palpitations RESP- denies SOB, cough, wheeze ABD- denies N/V, change in stools, abd pain Neuro-+ headache, dizziness, syncope, seizure activity       Objective:    BP 124/70 mmHg  Pulse 76  Temp(Src) 98.3 F (36.8 C) (Oral)  Resp 18  Ht 5\' 2"  (1.575 m)  Wt 169 lb (76.658 kg)  BMI 30.90 kg/m2  LMP 01/06/2015 GEN- NAD, alert and oriented x3 HEENT- PERRL, EOMI, non injected sclera, pink conjunctiva, MMM, oropharynx mild injection, TM clear bilat no effusion, + frontal sinus tenderness + maxillary sinus tenderness, inflammed turbinates,  Nasal drainage  Neck- Supple, no LAD CVS- RRR, no murmur RESP-CTAB Pulses- Radial 2+          Assessment & Plan:      Problem List Items Addressed This Visit    None    Visit Diagnoses    Acute maxillary sinusitis, recurrence not specified    -  Primary    Augmentinx 10 days, continue decongestant, nasal rinse as tolerated, given refill due to recurrent issues with sinuses    Relevant Medications    amoxicillin-clavulanate (AUGMENTIN) 875-125 MG per tablet       Note: This dictation was prepared with Dragon dictation along with smaller phrase technology. Any  transcriptional errors that result from this process are unintentional.

## 2015-01-26 ENCOUNTER — Telehealth: Payer: Self-pay | Admitting: Obstetrics and Gynecology

## 2015-01-31 ENCOUNTER — Telehealth: Payer: Self-pay | Admitting: *Deleted

## 2015-01-31 MED ORDER — FLUCONAZOLE 150 MG PO TABS: 150.0000 mg | ORAL_TABLET | Freq: Once | ORAL | Status: DC

## 2015-01-31 NOTE — Telephone Encounter (Signed)
Received call from patient.   Reports that she has been taking ABTx for sinus infection, and has noted that she has thick white vaginal discharge. States that she has some itching as well.   Prescription sent to pharmacy for Diflucan. Advised that if S/Sx do not resolve after dosage, OV will be required.

## 2015-02-01 ENCOUNTER — Encounter: Payer: Self-pay | Admitting: Obstetrics and Gynecology

## 2015-02-26 ENCOUNTER — Encounter: Payer: Self-pay | Admitting: General Practice

## 2015-03-23 ENCOUNTER — Encounter: Payer: Self-pay | Admitting: *Deleted

## 2015-03-28 ENCOUNTER — Encounter: Payer: Self-pay | Admitting: Obstetrics and Gynecology

## 2015-04-05 ENCOUNTER — Ambulatory Visit (INDEPENDENT_AMBULATORY_CARE_PROVIDER_SITE_OTHER): Payer: BC Managed Care – PPO | Admitting: Physician Assistant

## 2015-04-05 ENCOUNTER — Encounter: Payer: Self-pay | Admitting: Physician Assistant

## 2015-04-05 VITALS — BP 110/72 | HR 72 | Temp 98.7°F | Resp 16 | Ht 61.0 in | Wt 171.0 lb

## 2015-04-05 DIAGNOSIS — J069 Acute upper respiratory infection, unspecified: Secondary | ICD-10-CM

## 2015-04-05 MED ORDER — FLUCONAZOLE 150 MG PO TABS: 150.0000 mg | ORAL_TABLET | Freq: Once | ORAL | Status: DC

## 2015-04-05 MED ORDER — AMOXICILLIN-POT CLAVULANATE 875-125 MG PO TABS: 1.0000 | ORAL_TABLET | Freq: Two times a day (BID) | ORAL | Status: DC

## 2015-04-05 NOTE — Progress Notes (Signed)
Patient ID: Krystal Blackwell MRN: [id number], DOB: Jul 18, 1981, 34 y.o. Date of Encounter: 04/05/2015, 1:33 PM    Chief Complaint:  Chief Complaint  Patient presents with  . Otalgia    left ear pain, going on for last 3 days, pain is constant, said she is having bad drainage.     HPI: 34 y.o. 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 Getting ready to start back to school soon. Says that she gets sinus infections frequently. Has had sinus surgery but that really has not helped decrease number of infections much. Says that she is having pressure throughout her head and all sinus areas. Also thick dark mucus from the nose and drainage down the throat. No chest congestion. No fevers or chills. Throat has been a little irritated secondary to drainage but no significant sore throat.     Home Meds:   Outpatient Prescriptions Prior to Visit  Medication Sig Dispense Refill  . buPROPion (WELLBUTRIN XL) 150 MG 24 hr tablet Take 300 mg by mouth daily. Takes two 150 mg tabs daily    . Cyanocobalamin (B-12 COMPLIANCE INJECTION IJ) Inject 1 mL as directed every 30 (thirty) days.    Marland Kitchen ibuprofen (ADVIL,MOTRIN) 400 MG tablet Take 1 tablet (400 mg total) by mouth every 6 (six) hours as needed. 30 tablet 0  . Multiple Vitamin (MULTIVITAMIN) tablet Take 1 tablet by mouth daily.    . Vitamin D, Ergocalciferol, (DRISDOL) 50000 UNITS CAPS capsule Take 1 capsule (50,000 Units total) by mouth once a week. 12 capsule 4  . amoxicillin-clavulanate (AUGMENTIN) 875-125 MG per tablet Take 1 tablet by mouth 2 (two) times daily. (Patient not taking: Reported on 04/05/2015) 20 tablet 1  . fluconazole (DIFLUCAN) 150 MG tablet Take 1 tablet (150 mg total) by mouth once. (Patient not taking: Reported on 04/05/2015) 1 tablet 0   No facility-administered medications prior to visit.    Allergies:  Allergies  Allergen Reactions  . Avelox [Moxifloxacin Hcl In Nacl]       Review of Systems: See HPI  for pertinent ROS. All other ROS negative.    Physical Exam: Blood pressure 110/72, pulse 72, temperature 98.7 F (37.1 C), temperature source Oral, resp. rate 16, height 5\' 1"  (1.549 m), weight 171 lb (77.565 kg)., Body mass index is 32.33 kg/(m^2). General:  WNWD WF. Appears in no acute distress. HEENT: Normocephalic, atraumatic, eyes without discharge, sclera non-icteric, nares are without discharge. Bilateral auditory canals clear, TM's have a lot of area of white scar. Pt says she has had multiple sets of tubes. Area of TM that can be visualized appears clear. Oral cavity moist, posterior pharynx without exudate, erythema, peritonsillar abscess. Minimal tenderness with percussion of frontal and maxillary sinuses bilaterally.  Neck: Supple. No thyromegaly. Mild tenderness with palpation of left tonsillar and cervical nodes. No significantly enlarged nodes with palpation. Lungs: Clear bilaterally to auscultation without wheezes, rales, or rhonchi. Breathing is unlabored. Heart: Regular rhythm. No murmurs, rubs, or gallops. Msk:  Strength and tone normal for age. Extremities/Skin: Warm and dry.  No rashes. Neuro: Alert and oriented X 3. Moves all extremities spontaneously. Gait is normal. CNII-XII grossly in tact. Psych:  Responds to questions appropriately with a normal affect.     ASSESSMENT AND PLAN:  34 y.o. year old female with  1. Acute upper respiratory infection - amoxicillin-clavulanate (AUGMENTIN) 875-125 MG per tablet; Take 1 tablet by mouth 2 (two) times daily.  Dispense: 20 tablet; Refill:  0 - fluconazole (DIFLUCAN) 150 MG tablet; Take 1 tablet (150 mg total) by mouth once.  Dispense: 1 tablet; Refill: 0  Take antibody because directed and complete all of it. If develops symptoms of yeast infection can take the Diflucan. Follow-up if symptoms do not resolve with completion of antibiotic.  Signed, [address] [place], [place], [facility] 04/05/2015 1:33 PM

## 2015-04-09 ENCOUNTER — Telehealth: Payer: Self-pay | Admitting: General Practice

## 2015-04-09 NOTE — Telephone Encounter (Signed)
lmtcb

## 2015-04-09 NOTE — Telephone Encounter (Signed)
Tussionex 5 ml po QHS prn cough # 180 ml +0

## 2015-04-09 NOTE — Telephone Encounter (Signed)
Patient was in last week and now cough has gotten deeper and worse please call her about this at [phone]

## 2015-04-09 NOTE — Telephone Encounter (Signed)
Still on antibiotic.  Cough deeper and more productive.  Chest hurts from cough.  Wanting something to help sleep at night from coughing.

## 2015-04-10 MED ORDER — HYDROCOD POLST-CPM POLST ER 10-8 MG/5ML PO SUER: 5.0000 mL | Freq: Every evening | ORAL | Status: DC | PRN

## 2015-04-10 NOTE — Telephone Encounter (Signed)
Pt aware of RX.  Will be by to pick up RX

## 2015-04-11 ENCOUNTER — Encounter: Payer: Self-pay | Admitting: Family Medicine

## 2015-04-11 ENCOUNTER — Ambulatory Visit (INDEPENDENT_AMBULATORY_CARE_PROVIDER_SITE_OTHER): Payer: BC Managed Care – PPO | Admitting: Family Medicine

## 2015-04-11 ENCOUNTER — Telehealth: Payer: Self-pay | Admitting: Physician Assistant

## 2015-04-11 VITALS — BP 130/74 | HR 78 | Temp 99.1°F | Resp 16 | Ht 61.0 in | Wt 168.0 lb

## 2015-04-11 DIAGNOSIS — J069 Acute upper respiratory infection, unspecified: Secondary | ICD-10-CM

## 2015-04-11 DIAGNOSIS — J0100 Acute maxillary sinusitis, unspecified: Secondary | ICD-10-CM

## 2015-04-11 MED ORDER — METHYLPREDNISOLONE ACETATE 40 MG/ML IJ SUSP
40.0000 mg | Freq: Once | INTRAMUSCULAR | Status: AC
  Administered 2015-04-11: 40 mg via INTRAMUSCULAR

## 2015-04-11 MED ORDER — DOXYCYCLINE HYCLATE 100 MG PO TABS: 100.0000 mg | ORAL_TABLET | Freq: Two times a day (BID) | ORAL | Status: DC

## 2015-04-11 NOTE — Telephone Encounter (Signed)
Patient is calling to say that she is still not feeling better and would like a call back about what she should do next  [phone] has been on antibiotic for 6 days

## 2015-04-11 NOTE — Telephone Encounter (Signed)
Called pt and made her an appt for later today

## 2015-04-12 NOTE — Patient Instructions (Signed)
AVS not printed sytem down

## 2015-04-12 NOTE — Progress Notes (Signed)
Patient ID: Krystal Blackwell, female   DOB: April 18, 1981, 34 y.o.   MRN: [id number]   Subjective:    Patient ID: Krystal Blackwell, female    DOB: Apr 28, 1981, 34 y.o.   MRN: [id number]  Patient presents for URI  Pt here with worsening sinusitis, now has cough with mild production, seen last Friday history of recurrent chronic sinus problems, prescribed augmentin. She has not been improving like she typically does with her nasal irrigation, decongestant and antibiotics. Denies any fever. Taking OTC cough medicine as needed, but sinus pressure and drainage the worse, still thick green discharge.     Review Of Systems:  GEN- denies fatigue, fever, weight loss,weakness,+ recent illness HEENT- denies eye drainage, change in vision,+ nasal discharge, CVS- denies chest pain, palpitations RESP- denies SOB, cough, wheeze ABD- denies N/V, change in stools, abd pain GU- denies dysuria, hematuria, dribbling, incontinence MSK- denies joint pain, muscle aches, injury Neuro- denies headache, dizziness, syncope, seizure activity       Objective:    BP 130/74 mmHg  Pulse 78  Temp(Src) 99.1 F (37.3 C) (Oral)  Resp 16  Ht 5\' 1"  (1.549 m)  Wt 168 lb (76.204 kg)  BMI 31.76 kg/m2  LMP 03/27/2015 GEN- NAD, alert and oriented x3 HEENT- PERRL, EOMI, non injected sclera, pink conjunctiva, MMM, oropharynx mild injection, TM clear bilat no effusion,  + maxillary sinus tenderness, inflammed turbinates,  Nasal drainage  Neck- Supple, shotty ant  LAD CVS- RRR, no murmur RESP-CTAB EXT- No edema Pulses- Radial 2+          Assessment & Plan:      Problem List Items Addressed This Visit    None    Visit Diagnoses    Acute upper respiratory infection    -  Primary    Relevant Medications    methylPREDNISolone acetate (DEPO-MEDROL) injection 40 mg (Completed)    Acute maxillary sinusitis, recurrence not specified        acute on chronic sinusitis, not improving with augmentin, now with post nasal  causing some URI symptoms, will change to doxy, given Depo Medrol shot, continue nasal regimen    Relevant Medications    doxycycline (VIBRA-TABS) 100 MG tablet    methylPREDNISolone acetate (DEPO-MEDROL) injection 40 mg (Completed)       Note: This dictation was prepared with Dragon dictation along with smaller phrase technology. Any transcriptional errors that result from this process are unintentional.

## 2015-04-23 ENCOUNTER — Encounter: Payer: Self-pay | Admitting: Physician Assistant

## 2015-04-30 ENCOUNTER — Ambulatory Visit: Payer: BC Managed Care – PPO | Admitting: Physician Assistant

## 2015-05-31 ENCOUNTER — Encounter: Payer: Self-pay | Admitting: Physician Assistant

## 2015-05-31 NOTE — Telephone Encounter (Signed)
ERROR

## 2015-08-09 ENCOUNTER — Other Ambulatory Visit: Payer: Self-pay | Admitting: *Deleted

## 2015-08-09 ENCOUNTER — Telehealth: Payer: Self-pay | Admitting: Obstetrics and Gynecology

## 2015-08-09 MED ORDER — BUPROPION HCL ER (XL) 150 MG PO TB24: 300.0000 mg | ORAL_TABLET | Freq: Every day | ORAL | Status: DC

## 2015-08-09 NOTE — Telephone Encounter (Signed)
This pt has here Ae coming up but her refills on Wellbutrin 150 mg has expired. She has called the pharmacy and told them to send a refill request but to my knowledge they have not sent it. Can you refill unti her ae visit

## 2015-08-09 NOTE — Telephone Encounter (Signed)
Done-ac 

## 2015-08-14 ENCOUNTER — Encounter: Payer: Self-pay | Admitting: *Deleted

## 2015-08-17 ENCOUNTER — Encounter: Payer: Self-pay | Admitting: Obstetrics and Gynecology

## 2015-08-17 ENCOUNTER — Ambulatory Visit (INDEPENDENT_AMBULATORY_CARE_PROVIDER_SITE_OTHER): Payer: BC Managed Care – PPO | Admitting: Obstetrics and Gynecology

## 2015-08-17 VITALS — BP 136/87 | HR 110 | Ht 61.0 in | Wt 162.9 lb

## 2015-08-17 DIAGNOSIS — J309 Allergic rhinitis, unspecified: Secondary | ICD-10-CM | POA: Diagnosis not present

## 2015-08-17 DIAGNOSIS — Z01419 Encounter for gynecological examination (general) (routine) without abnormal findings: Secondary | ICD-10-CM

## 2015-08-17 DIAGNOSIS — E538 Deficiency of other specified B group vitamins: Secondary | ICD-10-CM | POA: Diagnosis not present

## 2015-08-17 DIAGNOSIS — E669 Obesity, unspecified: Secondary | ICD-10-CM | POA: Diagnosis not present

## 2015-08-17 DIAGNOSIS — N921 Excessive and frequent menstruation with irregular cycle: Secondary | ICD-10-CM | POA: Diagnosis not present

## 2015-08-17 MED ORDER — INFLUENZA VAC SPLIT QUAD 0.5 ML IM SUSY
0.5000 mL | PREFILLED_SYRINGE | Freq: Once | INTRAMUSCULAR | Status: AC
  Administered 2015-08-17: 0.5 mL via INTRAMUSCULAR

## 2015-08-17 MED ORDER — CYANOCOBALAMIN 1000 MCG/ML IJ SOLN: 1000.0000 ug | Freq: Once | INTRAMUSCULAR | Status: DC

## 2015-08-17 MED ORDER — BUPROPION HCL ER (SR) 150 MG PO TB12: 150.0000 mg | ORAL_TABLET | Freq: Two times a day (BID) | ORAL | Status: DC

## 2015-08-17 MED ORDER — ETONOGESTREL-ETHINYL ESTRADIOL 0.12-0.015 MG/24HR VA RING: VAGINAL_RING | VAGINAL | Status: DC

## 2015-08-17 MED ORDER — MONTELUKAST SODIUM 10 MG PO TABS: 10.0000 mg | ORAL_TABLET | Freq: Every day | ORAL | Status: DC

## 2015-08-17 MED ORDER — LORATADINE 10 MG PO TABS: 10.0000 mg | ORAL_TABLET | Freq: Every day | ORAL | Status: DC

## 2015-08-17 MED ORDER — PHENTERMINE HCL 37.5 MG PO TABS: 37.5000 mg | ORAL_TABLET | Freq: Every day | ORAL | Status: DC

## 2015-08-17 NOTE — Patient Instructions (Signed)
  Place annual gynecologic exam patient instructions here.  Thank you for enrolling in [facility]. Please follow the instructions below to securely access your online medical record. MyChart allows you to send messages to your doctor, view your test results, manage appointments, and more.   How Do I Sign Up? 1. In your Internet browser, go to [employer] and enter [email or web address]. 2. Click on the Sign Up Now link in the Sign In box. You will see the New Member Sign Up page. 3. Enter your MyChart Access Code exactly as it appears below. You will not need to use this code after you've completed the sign-up process. If you do not sign up before the expiration date, you must request a new code.  MyChart Access Code: [email or web address] Expires: 10/16/2015 10:38 AM  4. Enter your Social Security Number ([id number]) and Date of Birth (mm/dd/yyyy) as indicated and click Submit. You will be taken to the next sign-up page. 5. Create a MyChart ID. This will be your MyChart login ID and cannot be changed, so think of one that is secure and easy to remember. 6. Create a MyChart password. You can change your password at any time. 7. Enter your Password Reset Question and Answer. This can be used at a later time if you forget your password.  8. Enter your e-mail address. You will receive e-mail notification when new information is available in [facility]. 9. Click Sign Up. You can now view your medical record.   Additional Information Remember, MyChart is NOT to be used for urgent needs. For medical emergencies, dial 911.

## 2015-08-17 NOTE — Progress Notes (Signed)
Subjective:   Krystal Blackwell is a 34 y.o. G89P2 Caucasian female here for a routine well-woman exam.  Patient's last menstrual period was 07/23/2015.    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  Past Medical History Past Medical History  Diagnosis Date  . Blood transfusion without reported diagnosis     [id number]  . Depression   . Recurrent sinus infections   . Scoliosis   . Pelvic pain in female     Past Surgical History Past Surgical History  Procedure Laterality Date  . Spine surgery  97,98,99,00    scoliosis  . Nasal septum surgery  11/2013    Gynecologic History G2P2  Patient's last menstrual period was 07/23/2015. Contraception: abstinence Last Pap: 2015. Results were: normal  Obstetric History OB History  Gravida Para Term Preterm AB SAB TAB Ectopic Multiple Living  2 2            # Outcome Date GA Lbr Len/2nd Weight Sex Delivery Anes PTL Lv  2 Para           1 Para               Current Medications Current Outpatient Prescriptions on File Prior to Visit  Medication Sig Dispense Refill  . Multiple Vitamin (MULTIVITAMIN) tablet Take 1 tablet by mouth daily.    . Vitamin D, Ergocalciferol, (DRISDOL) 50000 UNITS CAPS capsule Take 1 capsule (50,000 Units total) by mouth once a week. 12 capsule 4  . Cyanocobalamin (B-12 COMPLIANCE INJECTION IJ) Inject 1 mL as directed every 30 (thirty) days. Reported on 08/17/2015     . ibuprofen (ADVIL,MOTRIN) 400 MG tablet Take 1 tablet (400 mg total) by mouth every 6 (six) hours as needed. (Patient not taking: Reported on 08/17/2015) 30 tablet 0   No current facility-administered medications on file prior to visit.    Review of Systems Patient denies any headaches, blurred vision, shortness of breath, chest pain, abdominal pain, problems with bowel movements, urination, or intercourse.  Objective:  BP 136/87 mmHg  Pulse 110  Ht 5\' 1"  (1.549 m)  Wt 162 lb 14.4 oz (73.891 kg)  BMI 30.80 kg/m2  LMP 07/23/2015 Physical Exam  General:  Well developed, well nourished, no acute distress. She is alert and oriented x3. Skin:  Warm and dry Neck:  Midline trachea, no thyromegaly or nodules Cardiovascular: Regular rate and rhythm, no murmur heard Lungs:  Effort normal, all lung fields clear to auscultation bilaterally Breasts:  No dominant palpable mass, retraction, or nipple discharge Abdomen:  Soft, non tender, no hepatosplenomegaly or masses Pelvic:  External genitalia is normal in appearance.  The vagina is normal in appearance. The cervix is bulbous, no CMT.  Thin prep pap is not done  Uterus is felt to be normal size, shape, and contour.  No adnexal masses or tenderness noted. Extremities:  No swelling or varicosities noted Psych:  She has a normal mood and affect  Assessment:   Healthy well-woman exam Menorrhagia with irregular cycles  B12 deficiency Depression obesity  Plan:  Labs obtained and refills sent in changed from ZyrtecD to singulair and claritin Restarted B12 and adipex for weight loss, B12 given today and will RTC in 1 month or next injection. F/U 1 year for AE, or sooner if needed   Melody Rockney Ghee, CNM

## 2015-08-17 NOTE — Addendum Note (Signed)
Addended by: Dorette Grate, [name] L on: 08/17/2015 11:26 AM   Modules accepted: Orders

## 2015-08-18 LAB — COMPREHENSIVE METABOLIC PANEL
ALT: 15 IU/L (ref 0–32)
AST: 15 IU/L (ref 0–40)
Albumin/Globulin Ratio: 1.7 (ref 1.1–2.5)
Albumin: 4.7 g/dL (ref 3.5–5.5)
Alkaline Phosphatase: 69 IU/L (ref 39–117)
BUN/Creatinine Ratio: 9 (ref 8–20)
BUN: 8 mg/dL (ref 6–20)
Bilirubin Total: 0.3 mg/dL (ref 0.0–1.2)
CO2: 21 mmol/L (ref 18–29)
Calcium: 9.6 mg/dL (ref 8.7–10.2)
Chloride: 99 mmol/L (ref 96–106)
Creatinine, Ser: 0.86 mg/dL (ref 0.57–1.00)
GFR calc Af Amer: 101 mL/min/{1.73_m2} (ref >59)
GFR calc non Af Amer: 88 mL/min/{1.73_m2} (ref >59)
Globulin, Total: 2.7 g/dL (ref 1.5–4.5)
Glucose: 72 mg/dL (ref 65–99)
Potassium: 3.9 mmol/L (ref 3.5–5.2)
Sodium: 139 mmol/L (ref 134–144)
Total Protein: 7.4 g/dL (ref 6.0–8.5)

## 2015-08-18 LAB — CBC
Hematocrit: 41.1 % (ref 34.0–46.6)
Hemoglobin: 13.4 g/dL (ref 11.1–15.9)
MCH: 30.9 pg (ref 26.6–33.0)
MCHC: 32.6 g/dL (ref 31.5–35.7)
MCV: 95 fL (ref 79–97)
Platelets: 545 10*3/uL — ABNORMAL HIGH (ref 150–379)
RBC: 4.34 x10E6/uL (ref 3.77–5.28)
RDW: 13.2 % (ref 12.3–15.4)
WBC: 9.7 10*3/uL (ref 3.4–10.8)

## 2015-08-18 LAB — HEMOGLOBIN A1C
Est. average glucose Bld gHb Est-mCnc: 103 mg/dL
Hgb A1c MFr Bld: 5.2 % (ref 4.8–5.6)

## 2015-08-18 LAB — THYROID PANEL WITH TSH
Free Thyroxine Index: 2.3 (ref 1.2–4.9)
T3 Uptake Ratio: 29 % (ref 24–39)
T4, Total: 7.9 ug/dL (ref 4.5–12.0)
TSH: 2.16 u[IU]/mL (ref 0.450–4.500)

## 2015-08-18 LAB — LIPID PANEL
Chol/HDL Ratio: 2.4 ratio units (ref 0.0–4.4)
Cholesterol, Total: 200 mg/dL — ABNORMAL HIGH (ref 100–199)
HDL: 84 mg/dL (ref >39)
LDL Calculated: 92 mg/dL (ref 0–99)
Triglycerides: 118 mg/dL (ref 0–149)
VLDL Cholesterol Cal: 24 mg/dL (ref 5–40)

## 2015-08-18 LAB — VITAMIN D 25 HYDROXY (VIT D DEFICIENCY, FRACTURES): Vit D, 25-Hydroxy: 27.8 ng/mL — ABNORMAL LOW (ref 30.0–100.0)

## 2015-08-18 LAB — IRON: Iron: 40 ug/dL (ref 27–159)

## 2015-08-18 LAB — VITAMIN B12: Vitamin B-12: 375 pg/mL (ref 211–946)

## 2015-08-21 ENCOUNTER — Other Ambulatory Visit: Payer: Self-pay | Admitting: Obstetrics and Gynecology

## 2015-08-21 DIAGNOSIS — R7989 Other specified abnormal findings of blood chemistry: Secondary | ICD-10-CM | POA: Insufficient documentation

## 2015-08-21 MED ORDER — VITAMIN D (ERGOCALCIFEROL) 1.25 MG (50000 UNIT) PO CAPS: 50000.0000 [IU] | ORAL_CAPSULE | ORAL | Status: DC

## 2015-08-27 NOTE — Telephone Encounter (Signed)
Notified pt of lab results 

## 2015-08-27 NOTE — Telephone Encounter (Signed)
-----   Message from Joylene Igo, [place] sent at 08/21/2015  9:53 AM EST ----- Please let her know all labs look good except platelets are high and vit D is too low. Has she ever had high platelets in past? No signs of anemia. I will send in rx for vit D weekly supplements, please mail info on vit D def. I want to recheck it in 3 months. Also will recheck platelet levels at that time. If still elevated with refer her for more evaluation.

## 2015-09-14 ENCOUNTER — Ambulatory Visit: Payer: BC Managed Care – PPO

## 2015-11-12 ENCOUNTER — Encounter: Payer: Self-pay | Admitting: Family Medicine

## 2015-11-12 ENCOUNTER — Ambulatory Visit (INDEPENDENT_AMBULATORY_CARE_PROVIDER_SITE_OTHER): Payer: BC Managed Care – PPO | Admitting: Family Medicine

## 2015-11-12 VITALS — BP 120/64 | HR 88 | Temp 98.5°F | Resp 16 | Ht 61.0 in | Wt 158.0 lb

## 2015-11-12 DIAGNOSIS — J019 Acute sinusitis, unspecified: Secondary | ICD-10-CM | POA: Diagnosis not present

## 2015-11-12 MED ORDER — AMOXICILLIN-POT CLAVULANATE 875-125 MG PO TABS: 1.0000 | ORAL_TABLET | Freq: Two times a day (BID) | ORAL | Status: DC

## 2015-11-12 MED ORDER — FLUTICASONE PROPIONATE 50 MCG/ACT NA SUSP: 2.0000 | Freq: Every day | NASAL | Status: DC

## 2015-11-12 MED ORDER — FLUCONAZOLE 150 MG PO TABS: 150.0000 mg | ORAL_TABLET | Freq: Once | ORAL | Status: DC

## 2015-11-12 NOTE — Progress Notes (Signed)
Subjective:    Patient ID: Krystal Blackwell, female    DOB: [birth year], 35 y.o.   MRN: [id number]  HPI She has a history of chronic recurrent sinusitis.  Beginning one week ago, she developed rhinorrhea with pain and pressure in both maxillary and both frontal sinuses. It is getting worse. She reports subjective fevers, constant dull headache, sinus pressure, and postnasal drip. She is also having pain in her teeth and a scratchy throat due to the postnasal drip. She was tried Zyrtec-D without relief. The headaches are getting worse. Past Medical History  Diagnosis Date  . Blood transfusion without reported diagnosis     [id number]  . Depression   . Recurrent sinus infections   . Scoliosis   . Pelvic pain in female    Past Surgical History  Procedure Laterality Date  . Spine surgery  97,98,99,00    scoliosis  . Nasal septum surgery  11/2013   Current Outpatient Prescriptions on File Prior to Visit  Medication Sig Dispense Refill  . buPROPion (WELLBUTRIN SR) 150 MG 12 hr tablet Take 1 tablet (150 mg total) by mouth 2 (two) times daily. 60 tablet 11  . Cyanocobalamin (B-12 COMPLIANCE INJECTION IJ) Inject 1 mL as directed every 30 (thirty) days. Reported on 08/17/2015    . etonogestrel-ethinyl estradiol (NUVARING) 0.12-0.015 MG/24HR vaginal ring Insert vaginally and leave in place for 3 consecutive weeks, then remove for 1 week. 1 each 12  . Multiple Vitamin (MULTIVITAMIN) tablet Take 1 tablet by mouth daily.    . phentermine (ADIPEX-P) 37.5 MG tablet Take 1 tablet (37.5 mg total) by mouth daily before breakfast. 30 tablet 2  . Vitamin D, Ergocalciferol, (DRISDOL) 50000 units CAPS capsule Take 1 capsule (50,000 Units total) by mouth once a week. 12 capsule 4   No current facility-administered medications on file prior to visit.   Allergies  Allergen Reactions  . Avelox [Moxifloxacin Hcl In Nacl]    Social History   Social History  . Marital Status: Single    Spouse Name: N/A  .  Number of Children: N/A  . Years of Education: N/A   Occupational History  . Not on file.   Social History Main Topics  . Smoking status: Current Some Day Smoker  . Smokeless tobacco: Never Used  . Alcohol Use: 3.6 oz/week    6 Cans of beer per week  . Drug Use: No  . Sexual Activity: Yes    Birth Control/ Protection: None   Other Topics Concern  . Not on file   Social History Narrative   Entered 06/2014:   Has 2 children--both are boys--one is 62 y/o. The other is "teenager"--not sure exact age.   At home --it is her and her 2 sons.   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      Review of Systems  All other systems reviewed and are negative.      Objective:   Physical Exam  Constitutional: She appears well-developed and well-nourished.  HENT:  Right Ear: External ear normal.  Left Ear: External ear normal.  Nose: Mucosal edema and rhinorrhea present. Right sinus exhibits maxillary sinus tenderness and frontal sinus tenderness. Left sinus exhibits maxillary sinus tenderness and frontal sinus tenderness.  Mouth/Throat: Oropharynx is clear and moist. No oropharyngeal exudate.  Eyes: Conjunctivae are normal.  Neck: Neck supple.  Cardiovascular: Normal rate, regular rhythm and normal heart sounds.   No murmur heard. Pulmonary/Chest: Effort normal and breath sounds  normal. No respiratory distress. She has no wheezes. She has no rales.  Lymphadenopathy:    She has no cervical adenopathy.  Vitals reviewed.         Assessment & Plan:  Acute rhinosinusitis - Plan: fluticasone (FLONASE) 50 MCG/ACT nasal spray, amoxicillin-clavulanate (AUGMENTIN) 875-125 MG tablet, fluconazole (DIFLUCAN) 150 MG tablet  Begin Augmentin 875 mg by mouth twice a day for 10 days along with Flonase 2 sprays each nostril daily. If symptoms are not improving, consider prednisone.

## 2015-11-13 ENCOUNTER — Telehealth: Payer: Self-pay | Admitting: Physician Assistant

## 2015-11-13 NOTE — Telephone Encounter (Signed)
Patient calling to say she is not feeling better and dr pickard had mentioned giving her a steroid to make her feel better, would like to know what to do  [phone]

## 2015-11-13 NOTE — Telephone Encounter (Signed)
abx will take time, I am okay with prednisone taper pack.

## 2015-11-14 MED ORDER — PREDNISONE 20 MG PO TABS: ORAL_TABLET | ORAL | Status: DC

## 2015-11-14 NOTE — Telephone Encounter (Signed)
LMTRC

## 2015-11-14 NOTE — Telephone Encounter (Signed)
Called and spoke to pt and she states that she actually feels worse today then 2 days ago. pred sent to pharm as per pt's request

## 2015-11-26 ENCOUNTER — Telehealth: Payer: Self-pay | Admitting: Obstetrics and Gynecology

## 2015-11-26 NOTE — Telephone Encounter (Signed)
Patient called complaining of increased heart rate and is not sure if its due to a medication or what.

## 2015-12-31 NOTE — Telephone Encounter (Signed)
Called pt several times we played phone tag, attempted again no answer

## 2016-01-16 ENCOUNTER — Telehealth: Payer: Self-pay | Admitting: Obstetrics and Gynecology

## 2016-01-16 NOTE — Telephone Encounter (Signed)
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 It is recurring

## 2016-01-23 ENCOUNTER — Encounter: Payer: Self-pay | Admitting: Family Medicine

## 2016-01-23 ENCOUNTER — Ambulatory Visit (INDEPENDENT_AMBULATORY_CARE_PROVIDER_SITE_OTHER): Payer: BC Managed Care – PPO | Admitting: Family Medicine

## 2016-01-23 VITALS — BP 142/98 | HR 90 | Temp 99.0°F | Resp 16 | Ht 61.0 in | Wt 154.0 lb

## 2016-01-23 DIAGNOSIS — J329 Chronic sinusitis, unspecified: Secondary | ICD-10-CM

## 2016-01-23 DIAGNOSIS — F418 Other specified anxiety disorders: Secondary | ICD-10-CM | POA: Diagnosis not present

## 2016-01-23 MED ORDER — PREDNISONE 20 MG PO TABS: ORAL_TABLET | ORAL | Status: DC

## 2016-01-23 MED ORDER — BUPROPION HCL ER (SR) 150 MG PO TB12: 150.0000 mg | ORAL_TABLET | Freq: Two times a day (BID) | ORAL | Status: DC

## 2016-01-23 MED ORDER — DOXYCYCLINE HYCLATE 100 MG PO TABS
100.0000 mg | ORAL_TABLET | Freq: Two times a day (BID) | ORAL | Status: DC
Start: 2016-01-23 — End: 2016-06-16

## 2016-01-23 MED ORDER — FLUCONAZOLE 150 MG PO TABS: 150.0000 mg | ORAL_TABLET | Freq: Once | ORAL | Status: DC

## 2016-01-23 NOTE — Assessment & Plan Note (Signed)
I will have her restart the Wellbutrin was start 150 mg once a day and then she can go to the 2 tablets

## 2016-01-23 NOTE — Assessment & Plan Note (Signed)
F/u ENT in 2 weeks Given Doxy, recent augmentin Also steroids in case no improvement Continue other allergy meds

## 2016-01-23 NOTE — Patient Instructions (Signed)
Restart with 1 tablet of wellbutrin for 1 week  Take antibiotics as prescribed F/U as needed

## 2016-01-23 NOTE — Progress Notes (Signed)
Patient ID: Krystal Blackwell, female   DOB: 28-May-1981, 34 y.o.   MRN: [id number]   Subjective:    Patient ID: Krystal Blackwell, female    DOB: 06/02/1981, 35 y.o.   MRN: [id number]  Patient presents for Illness Patient here with her recurrent sinusitis symptoms. She has had sinus surgery. She has an appointment to see ENT in about 2 weeks. For the past couple weeks she's had a regular sinus drainage pressure feels like her typical infections. She had low-grade fever chills a few days. She's been using her nasal rinses using her sprays prescribed with no improvement.  She will also want to consider going back on her well be true. It was actually stopped by her GYN about 4 weeks ago she had been on the Wellbutrin secondary to stress with stomach anxiety and depression symptoms during her for life. After she lost weight and school stress had calmed down from her job she thought it will be a good time to come off of the [employer] being all she is found herself anxious at times not sleeping very well so is generally on and her blood pressures also been a little elevated she does not feel quite right. They did actually try to taper her down from 300 total daily to 150 and then off.    Review Of Systems:  GEN- denies fatigue, fever, weight loss,weakness, recent illness HEENT- denies eye drainage, change in vision, +nasal discharge, CVS- denies chest pain, palpitations RESP- denies SOB, cough, wheeze ABD- denies N/V, change in stools, abd pain GU- denies dysuria, hematuria, dribbling, incontinence MSK- denies joint pain, muscle aches, injury Neuro- denies headache, dizziness, syncope, seizure activity       Objective:    BP 142/98 mmHg  Pulse 90  Temp(Src) 99 F (37.2 C) (Oral)  Resp 16  Ht 5\' 1"  (1.549 m)  Wt 154 lb (69.854 kg)  BMI 29.11 kg/m2  SpO2 98%  LMP 12/23/2015 GEN- NAD, alert and oriented x3 HEENT- PERRL, EOMI, non injected sclera, pink conjunctiva, MMM, oropharynx mild  injection, TM clear bilat no effusion,  + Right maxillary sinus tenderness, inflammed turbinates,  Nasal drainage  Neck- Supple, no LAD CVS- RRR, no murmur RESP-CTAB Psych- normal affect and mood  EXT- No edema Pulses- Radial 2+         Assessment & Plan:      Problem List Items Addressed This Visit    Recurrent rhinosinusitis - Primary    F/u ENT in 2 weeks Given Doxy, recent augmentin Also steroids in case no improvement Continue other allergy meds      Relevant Medications   pseudoephedrine-guaifenesin (MUCINEX D) 60-600 MG 12 hr tablet   doxycycline (VIBRA-TABS) 100 MG tablet   predniSONE (DELTASONE) 20 MG tablet   fluconazole (DIFLUCAN) 150 MG tablet   Depression with anxiety    I will have her restart the Wellbutrin was start 150 mg once a day and then she can go to the 2 tablets         Note: This dictation was prepared with Dragon dictation along with smaller phrase technology. Any transcriptional errors that result from this process are unintentional.

## 2016-03-11 ENCOUNTER — Ambulatory Visit: Payer: BC Managed Care – PPO | Admitting: Obstetrics and Gynecology

## 2016-03-14 ENCOUNTER — Ambulatory Visit: Payer: BC Managed Care – PPO | Admitting: Obstetrics and Gynecology

## 2016-03-18 ENCOUNTER — Encounter: Payer: Self-pay | Admitting: Obstetrics and Gynecology

## 2016-03-18 ENCOUNTER — Ambulatory Visit (INDEPENDENT_AMBULATORY_CARE_PROVIDER_SITE_OTHER): Payer: BC Managed Care – PPO | Admitting: Obstetrics and Gynecology

## 2016-03-18 VITALS — BP 123/89 | HR 80 | Ht 60.0 in | Wt 151.6 lb

## 2016-03-18 DIAGNOSIS — E559 Vitamin D deficiency, unspecified: Secondary | ICD-10-CM

## 2016-03-18 DIAGNOSIS — D72829 Elevated white blood cell count, unspecified: Secondary | ICD-10-CM | POA: Diagnosis not present

## 2016-03-18 DIAGNOSIS — E669 Obesity, unspecified: Secondary | ICD-10-CM | POA: Diagnosis not present

## 2016-03-18 MED ORDER — PHENTERMINE HCL 37.5 MG PO TABS: 37.5000 mg | ORAL_TABLET | Freq: Every day | ORAL | 2 refills | Status: DC

## 2016-03-18 MED ORDER — CYANOCOBALAMIN 1000 MCG/ML IJ SOLN
1000.0000 ug | Freq: Once | INTRAMUSCULAR | Status: AC
  Administered 2016-12-09: 1000 ug via INTRAMUSCULAR

## 2016-03-18 NOTE — Progress Notes (Signed)
Subjective:     Patient ID: Krystal Blackwell, female   DOB: 04-26-1981, 35 y.o.   MRN: [id number]  HPI Took weight loss meds in past, had some anxiety while taking them, but has since found out is was from indigestion- has it under control now.  Review of Systems Denies concerns, just wants to restart meds and recheck labs.    Objective:   Physical Exam A&O x4  well groomed female in no distress Blood pressure 123/89, pulse 80, height 5' (1.524 m), weight 151 lb 9.6 oz (68.8 kg), last menstrual period 03/02/2016.    Assessment:     Restart weight loss meds Desires labs repeated Vit D deficiency    Plan:     Cbc and vit D ordered Refilled adipex (a friend gives her the monthly B12) RTC as needed or for AE in December  Tyshaun Vinzant Silt, [place]

## 2016-03-19 LAB — VITAMIN D 25 HYDROXY (VIT D DEFICIENCY, FRACTURES): Vit D, 25-Hydroxy: 49.7 ng/mL (ref 30.0–100.0)

## 2016-03-19 LAB — CBC
Hematocrit: 36.7 % (ref 34.0–46.6)
Hemoglobin: 12.2 g/dL (ref 11.1–15.9)
MCH: 32.1 pg (ref 26.6–33.0)
MCHC: 33.2 g/dL (ref 31.5–35.7)
MCV: 97 fL (ref 79–97)
Platelets: 523 10*3/uL — ABNORMAL HIGH (ref 150–379)
RBC: 3.8 x10E6/uL (ref 3.77–5.28)
RDW: 13.4 % (ref 12.3–15.4)
WBC: 9.1 10*3/uL (ref 3.4–10.8)

## 2016-03-27 ENCOUNTER — Telehealth: Payer: Self-pay | Admitting: *Deleted

## 2016-03-27 NOTE — Telephone Encounter (Signed)
-----   Message from Joylene Igo, [place] sent at 03/26/2016  4:58 PM EDT ----- Please let her know lab results, and that platelets are still high.

## 2016-03-27 NOTE — Telephone Encounter (Signed)
Mailed all labs to pt to review

## 2016-04-01 ENCOUNTER — Telehealth: Payer: Self-pay | Admitting: Obstetrics and Gynecology

## 2016-04-01 NOTE — Telephone Encounter (Signed)
PT DIDN'T RECEIVE LAB WORK FROM 2 WKS AGO IT WAS DEC DATE. SHE DIDN'T 2WK AGO LABS AND SHE ALSO TALKED TO MEL ABOUT IBS AND WANTED TO UPDATE HER ABOUT THIS.

## 2016-05-29 ENCOUNTER — Encounter: Payer: Self-pay | Admitting: Physician Assistant

## 2016-06-16 ENCOUNTER — Emergency Department: Payer: BC Managed Care – PPO

## 2016-06-16 ENCOUNTER — Observation Stay
Admission: EM | Admit: 2016-06-16 | Discharge: 2016-06-19 | Disposition: A | Discharge status: Home / Self Care | Payer: BC Managed Care – PPO | Attending: Surgery | Admitting: Surgery

## 2016-06-16 DIAGNOSIS — K5732 Diverticulitis of large intestine without perforation or abscess without bleeding: Secondary | ICD-10-CM | POA: Diagnosis present

## 2016-06-16 DIAGNOSIS — F17200 Nicotine dependence, unspecified, uncomplicated: Secondary | ICD-10-CM | POA: Insufficient documentation

## 2016-06-16 DIAGNOSIS — M419 Scoliosis, unspecified: Secondary | ICD-10-CM | POA: Insufficient documentation

## 2016-06-16 DIAGNOSIS — F418 Other specified anxiety disorders: Secondary | ICD-10-CM | POA: Diagnosis not present

## 2016-06-16 DIAGNOSIS — E559 Vitamin D deficiency, unspecified: Secondary | ICD-10-CM | POA: Diagnosis not present

## 2016-06-16 DIAGNOSIS — Z8711 Personal history of peptic ulcer disease: Secondary | ICD-10-CM | POA: Insufficient documentation

## 2016-06-16 DIAGNOSIS — Z981 Arthrodesis status: Secondary | ICD-10-CM | POA: Insufficient documentation

## 2016-06-16 DIAGNOSIS — K5900 Constipation, unspecified: Secondary | ICD-10-CM | POA: Insufficient documentation

## 2016-06-16 LAB — CBC
HCT: 40.2 % (ref 35.0–47.0)
Hemoglobin: 13.4 g/dL (ref 12.0–16.0)
MCH: 32.1 pg (ref 26.0–34.0)
MCHC: 33.4 g/dL (ref 32.0–36.0)
MCV: 96.2 fL (ref 80.0–100.0)
Platelets: 437 10*3/uL (ref 150–440)
RBC: 4.18 MIL/uL (ref 3.80–5.20)
RDW: 13.2 % (ref 11.5–14.5)
WBC: 14.7 10*3/uL — ABNORMAL HIGH (ref 3.6–11.0)

## 2016-06-16 LAB — URINALYSIS COMPLETE WITH MICROSCOPIC (ARMC ONLY)
Bacteria, UA: NONE SEEN
Bilirubin Urine: NEGATIVE
Glucose, UA: NEGATIVE mg/dL
Ketones, ur: NEGATIVE mg/dL
Leukocytes, UA: NEGATIVE
Nitrite: NEGATIVE
Protein, ur: NEGATIVE mg/dL
Specific Gravity, Urine: 1.01 (ref 1.005–1.030)
pH: 7 (ref 5.0–8.0)

## 2016-06-16 LAB — COMPREHENSIVE METABOLIC PANEL
ALT: 15 U/L (ref 14–54)
AST: 16 U/L (ref 15–41)
Albumin: 4.1 g/dL (ref 3.5–5.0)
Alkaline Phosphatase: 68 U/L (ref 38–126)
Anion gap: 9 (ref 5–15)
BUN: 7 mg/dL (ref 6–20)
CO2: 23 mmol/L (ref 22–32)
Calcium: 9.4 mg/dL (ref 8.9–10.3)
Chloride: 98 mmol/L — ABNORMAL LOW (ref 101–111)
Creatinine, Ser: 0.65 mg/dL (ref 0.44–1.00)
GFR calc Af Amer: >60 mL/min (ref >60)
GFR calc non Af Amer: >60 mL/min (ref >60)
Glucose, Bld: 94 mg/dL (ref 65–99)
Potassium: 3.9 mmol/L (ref 3.5–5.1)
Sodium: 130 mmol/L — ABNORMAL LOW (ref 135–145)
Total Bilirubin: 0.5 mg/dL (ref 0.3–1.2)
Total Protein: 7.8 g/dL (ref 6.5–8.1)

## 2016-06-16 LAB — POCT PREGNANCY, URINE: Preg Test, Ur: NEGATIVE

## 2016-06-16 LAB — LIPASE, BLOOD: Lipase: 21 U/L (ref 11–51)

## 2016-06-16 MED ORDER — IOPAMIDOL (ISOVUE-300) INJECTION 61%
100.0000 mL | Freq: Once | INTRAVENOUS | Status: AC | PRN
Start: 2016-06-16 — End: 2016-06-16
  Administered 2016-06-16: 100 mL via INTRAVENOUS
  Filled 2016-06-16: qty 100

## 2016-06-16 MED ORDER — INFLUENZA VAC SPLIT QUAD 0.5 ML IM SUSY
0.5000 mL | PREFILLED_SYRINGE | INTRAMUSCULAR | Status: AC
  Administered 2016-06-17: 0.5 mL via INTRAMUSCULAR
  Filled 2016-06-16: qty 0.5

## 2016-06-16 MED ORDER — SODIUM CHLORIDE 0.9 % IV BOLUS (SEPSIS)
1000.0000 mL | Freq: Once | INTRAVENOUS | Status: AC
  Administered 2016-06-16: 1000 mL via INTRAVENOUS

## 2016-06-16 MED ORDER — HYDROMORPHONE HCL 1 MG/ML IJ SOLN
0.5000 mg | Freq: Once | INTRAMUSCULAR | Status: AC
  Administered 2016-06-16: 0.5 mg via INTRAVENOUS

## 2016-06-16 MED ORDER — ONDANSETRON HCL 4 MG/2ML IJ SOLN
4.0000 mg | Freq: Once | INTRAMUSCULAR | Status: AC
Start: 2016-06-16 — End: 2016-06-16
  Administered 2016-06-16: 4 mg via INTRAVENOUS
  Filled 2016-06-16: qty 2

## 2016-06-16 MED ORDER — ONDANSETRON 4 MG PO TBDP: 4.0000 mg | ORAL_TABLET | Freq: Four times a day (QID) | ORAL | Status: DC | PRN

## 2016-06-16 MED ORDER — HYDROMORPHONE HCL 1 MG/ML IJ SOLN
INTRAMUSCULAR | Status: AC
  Filled 2016-06-16: qty 1

## 2016-06-16 MED ORDER — ACETAMINOPHEN 325 MG PO TABS: 650.0000 mg | ORAL_TABLET | Freq: Four times a day (QID) | ORAL | Status: DC | PRN

## 2016-06-16 MED ORDER — MORPHINE SULFATE (PF) 4 MG/ML IV SOLN: 4.0000 mg | INTRAVENOUS | Status: DC | PRN

## 2016-06-16 MED ORDER — PIPERACILLIN-TAZOBACTAM 3.375 G IVPB
3.3750 g | Freq: Three times a day (TID) | INTRAVENOUS | Status: DC
  Administered 2016-06-16 – 2016-06-19 (×8): 3.375 g via INTRAVENOUS
  Filled 2016-06-16 (×9): qty 50

## 2016-06-16 MED ORDER — FENTANYL CITRATE (PF) 100 MCG/2ML IJ SOLN
50.0000 ug | Freq: Once | INTRAMUSCULAR | Status: AC
  Administered 2016-06-16: 50 ug via INTRAVENOUS
  Filled 2016-06-16: qty 2

## 2016-06-16 MED ORDER — ENOXAPARIN SODIUM 40 MG/0.4ML ~~LOC~~ SOLN
40.0000 mg | SUBCUTANEOUS | Status: DC
  Administered 2016-06-16 – 2016-06-18 (×3): 40 mg via SUBCUTANEOUS
  Filled 2016-06-16 (×3): qty 0.4

## 2016-06-16 MED ORDER — IOPAMIDOL (ISOVUE-300) INJECTION 61%
30.0000 mL | Freq: Once | INTRAVENOUS | Status: AC
  Administered 2016-06-16: 30 mL via ORAL
  Filled 2016-06-16: qty 30

## 2016-06-16 MED ORDER — KCL IN DEXTROSE-NACL 20-5-0.45 MEQ/L-%-% IV SOLN
INTRAVENOUS | Status: DC
Start: 2016-06-16 — End: 2016-06-18
  Administered 2016-06-16 – 2016-06-18 (×4): via INTRAVENOUS
  Filled 2016-06-16 (×6): qty 1000

## 2016-06-16 MED ORDER — ONDANSETRON HCL 4 MG/2ML IJ SOLN
4.0000 mg | Freq: Four times a day (QID) | INTRAMUSCULAR | Status: DC | PRN
  Administered 2016-06-17: 4 mg via INTRAVENOUS
  Filled 2016-06-16: qty 2

## 2016-06-16 MED ORDER — BUPROPION HCL ER (SR) 150 MG PO TB12: 150.0000 mg | ORAL_TABLET | Freq: Two times a day (BID) | ORAL | Status: DC

## 2016-06-16 MED ORDER — ACETAMINOPHEN 650 MG RE SUPP: 650.0000 mg | Freq: Four times a day (QID) | RECTAL | Status: DC | PRN

## 2016-06-16 MED ORDER — HYDROCODONE-ACETAMINOPHEN 5-325 MG PO TABS
1.0000 | ORAL_TABLET | ORAL | Status: DC | PRN
  Administered 2016-06-16 – 2016-06-17 (×2): 1 via ORAL
  Administered 2016-06-17 (×2): 2 via ORAL
  Administered 2016-06-17 (×2): 1 via ORAL
  Administered 2016-06-18: 2 via ORAL
  Administered 2016-06-18 (×2): 1 via ORAL
  Administered 2016-06-18: 2 via ORAL
  Administered 2016-06-19 (×3): 1 via ORAL
  Filled 2016-06-16: qty 1
  Filled 2016-06-16: qty 2
  Filled 2016-06-16 (×2): qty 1
  Filled 2016-06-16: qty 2
  Filled 2016-06-16 (×3): qty 1
  Filled 2016-06-16: qty 2
  Filled 2016-06-16 (×2): qty 1
  Filled 2016-06-16: qty 2
  Filled 2016-06-16: qty 1

## 2016-06-16 MED ORDER — PIPERACILLIN-TAZOBACTAM 3.375 G IVPB 30 MIN
3.3750 g | Freq: Once | INTRAVENOUS | Status: AC
  Administered 2016-06-16: 3.375 g via INTRAVENOUS
  Filled 2016-06-16 (×2): qty 50

## 2016-06-16 MED ORDER — FAMOTIDINE IN NACL 20-0.9 MG/50ML-% IV SOLN
20.0000 mg | Freq: Two times a day (BID) | INTRAVENOUS | Status: DC
  Administered 2016-06-16 – 2016-06-18 (×4): 20 mg via INTRAVENOUS
  Filled 2016-06-16 (×5): qty 50

## 2016-06-16 NOTE — H&P (Signed)
Krystal Blackwell is a 35 y.o. female  with left lower quadrant abdominal pain.  HPI: She was in her usual state of good health until several days ago when she developed significant left lower quadrant pain in the middle the night. Pain persisted that she has difficulty moving her bowels. She relates some fever over the next 24 hours the pain intensified today which brought her to the emergency room. Workup revealed slightly elevated white blood cell CT evidence of the; diverticulitis.  She denies any other serious GI problems. She is no previous GI surgery. She saw have any history of hepatitis of jaundice pancreatitis peptic ulcer disease gallbladder disease previous diagnosis of diverticulitis. She has no other major medical bra. Previous surgery has been some spinal fusion for scoliosis.  Past Medical History:  Diagnosis Date  . Blood transfusion without reported diagnosis    [id number]  . Depression   . Pelvic pain in female   . Recurrent sinus infections   . Scoliosis    Past Surgical History:  Procedure Laterality Date  . NASAL SEPTUM SURGERY  11/2013  . SPINE SURGERY  97,98,99,00   scoliosis   Social History   Social History  . Marital status: Single    Spouse name: N/A  . Number of children: N/A  . Years of education: N/A   Social History Main Topics  . Smoking status: Current Some Day Smoker  . Smokeless tobacco: Never Used  . Alcohol use 3.6 oz/week    6 Cans of beer per week  . Drug use: No  . Sexual activity: Yes    Birth control/ protection: None   Other Topics Concern  . None   Social History Narrative   Entered 06/2014:   Has 2 children--both are boys--one is 46 y/o. The other is "teenager"--not sure exact age.   At home --it is her and her 2 sons.   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     Review of Systems  Constitutional: Positive for fever. Negative for chills.  HENT: Negative.   Eyes: Negative.   Respiratory: Negative  for cough, sputum production, shortness of breath and wheezing.   Cardiovascular: Negative for chest pain and palpitations.  Gastrointestinal: Positive for abdominal pain. Negative for heartburn, nausea and vomiting.  Genitourinary: Negative.   Musculoskeletal: Positive for back pain and joint pain.  Skin: Negative.   Neurological: Negative.   Psychiatric/Behavioral: Negative.      PHYSICAL EXAM: BP 133/84 (BP Location: Right Arm)   Pulse 98   Temp 98.9 F (37.2 C) (Oral)   Resp 18   Ht 5\' 1"  (1.549 m)   Wt 71.7 kg (158 lb)   LMP 06/06/2016   SpO2 96%   BMI 29.85 kg/m   Physical Exam  Constitutional: She is oriented to person, place, and time. She appears well-developed and well-nourished. No distress.  HENT:  Head: Normocephalic and atraumatic.  Eyes: EOM are normal. Pupils are equal, round, and reactive to light.  Neck: Normal range of motion. Neck supple.  Cardiovascular: Normal rate and regular rhythm.   Pulmonary/Chest: Effort normal and breath sounds normal. No respiratory distress. She has no wheezes.  Abdominal: Soft. Bowel sounds are normal. She exhibits no distension. There is tenderness. There is guarding. There is no rebound.  Musculoskeletal: Normal range of motion. She exhibits no edema or deformity.  Neurological: She is alert and oriented to person, place, and time.  Skin: Skin is warm and dry.  She is not diaphoretic.  Psychiatric: She has a normal mood and affect. Her behavior is normal. Thought content normal.   She has marked before quadrant tenderness with some guarding but no rebound. She has good bowel sounds.  Impression/Plan: I reviewed her CT scan. With her fever and elevated white blood cell count CT changes consistent with diverticulitis I recommend hospital admission with plan of intravenous antibiotic therapy. She is in agreement with this plan.   Dia Crawford III, MD  06/16/2016, 5:51 PM

## 2016-06-16 NOTE — ED Notes (Signed)
Report to Raquel Sarna, to room 206 with tech.

## 2016-06-16 NOTE — ED Triage Notes (Signed)
Pt c/o LLQ pain since Saturday , denies vomiting or diarrhea.. States last BM was Friday, has taken fiber pills without relief

## 2016-06-16 NOTE — ED Provider Notes (Signed)
[facility] Emergency Department Provider Note  ____________________________________________  Time seen: Approximately 2:26 PM  I have reviewed the triage vital signs and the nursing notes.   HISTORY  Chief Complaint Abdominal Pain    HPI NENA MAXSON is a 35 y.o. female , nonpregnant, presenting with left lower quadrant pain and anorexia, constipation. The patient reports that at 3 AM 3 days ago, she woke up with a left lower quadrant pain. It is there all the time but worse with movement and positional changes. It is been associated with constipation, with no bowel movement in 4 days. At baseline, she usually has 3-4 bowel movements daily, so this is very unusual for her. She is still passing flatus. She is anorexia and nausea without vomiting, decreased by mouth intake. She denies any fever, chills, urinary symptoms, change in vaginal discharge.   Past Medical History:  Diagnosis Date  . Blood transfusion without reported diagnosis    [id number]  . Depression   . Pelvic pain in female   . Recurrent sinus infections   . Scoliosis     Patient Active Problem List   Diagnosis Date Noted  . 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 Vitamin D deficiency 06/19/2014  . Blood transfusion without reported diagnosis   . Depression with anxiety     Past Surgical History:  Procedure Laterality Date  . NASAL SEPTUM SURGERY  11/2013  . SPINE SURGERY  97,98,99,00   scoliosis    Current Outpatient Rx  . Order #: [id number] Class: Normal  . Order #: [id number] Class: Historical Med  . Order #: [id number] Class: Normal  . Order #: [id number] Class: Normal  . Order #: [id number] Class: Normal  . Order #: [id number] Class: Normal  . Order #: [id number] Class: Historical Med  . Order #: [id number] Class: Print  . Order #: [id number] Class: Normal  . Order #: [id number] Class: Historical Med  . Order #: [id number] Class: Normal     Allergies Avelox [moxifloxacin hcl in nacl]  Family History  Problem Relation Age of Onset  . Asthma Mother   . Hypertension Mother   . Miscarriages / [place] Mother   . Alcohol abuse Father   . Drug abuse Father   . Arthritis Maternal Grandmother     Social History Social History  Substance Use Topics  . Smoking status: Current Some Day Smoker  . Smokeless tobacco: Never Used  . Alcohol use 3.6 oz/week    6 Cans of beer per week    Review of Systems Constitutional: No fever/chills. Eyes: No visual changes. ENT: No sore throat. No congestion or rhinorrhea. Cardiovascular: Denies chest pain. Denies palpitations. Respiratory: Denies shortness of breath.  No cough. Gastrointestinal: Positive left lower quadrant abdominal pain.  Positive anorexia and nausea, no vomiting.  No diarrhea.  Positive constipation. Genitourinary: Negative for dysuria. Negative change in vaginal discharge. Musculoskeletal: Negative for back pain. Skin: Negative for rash. Neurological: Negative for headaches. No focal numbness, tingling or weakness.   10-point ROS otherwise negative.  ____________________________________________   PHYSICAL EXAM:  VITAL SIGNS: ED Triage Vitals  Enc Vitals Group     BP 06/16/16 1153 133/84     Pulse Rate 06/16/16 1153 98     Resp 06/16/16 1153 18     Temp 06/16/16 1153 98.9 F (37.2 C)     Temp Source 06/16/16 1153 Oral     SpO2 06/16/16 1153 96 %     Weight 06/16/16 1153 158 lb (71.7 kg)  Height 06/16/16 1153 5\' 1"  (1.549 m)     Head Circumference --      Peak Flow --      Pain Score 06/16/16 1154 8     Pain Loc --      Pain Edu? --      Excl. in [place]? --     Constitutional: Alert and oriented. Well appearing and in no acute distress. Answers questions appropriately. Eyes: Conjunctivae are normal.  EOMI. No scleral icterus. Head: Atraumatic. Nose: No congestion/rhinnorhea. Mouth/Throat: Mucous membranes are moist.  Neck: No stridor.   Supple.   Cardiovascular: Normal rate, regular rhythm. No murmurs, rubs or gallops.  Respiratory: Normal respiratory effort.  No accessory muscle use or retractions. Lungs CTAB.  No wheezes, rales or ronchi. Gastrointestinal: Soft and nondistended.  No CVA tenderness. Positive tenderness to palpation in left lower quadrant. No guarding or rebound.  No peritoneal signs. Musculoskeletal: No LE edema.  Neurologic:  A&Ox3.  Speech is clear.  Face and smile are symmetric.  EOMI.  Moves all extremities well. Skin:  Skin is warm, dry and intact. No rash noted. Psychiatric: Mood and affect are normal. Speech and behavior are normal.  Normal judgement.  ____________________________________________   LABS (all labs ordered are listed, but only abnormal results are displayed)  Labs Reviewed  COMPREHENSIVE METABOLIC PANEL - Abnormal; Notable for the following:       Result Value   Sodium 130 (*)    Chloride 98 (*)    All other components within normal limits  CBC - Abnormal; Notable for the following:    WBC 14.7 (*)    All other components within normal limits  URINALYSIS COMPLETEWITH MICROSCOPIC ([facility] ONLY) - Abnormal; Notable for the following:    Color, Urine YELLOW (*)    APPearance CLEAR (*)    Hgb urine dipstick 2+ (*)    Squamous Epithelial / LPF 6-30 (*)    All other components within normal limits  LIPASE, BLOOD  POC URINE PREG, ED  POCT PREGNANCY, URINE   ____________________________________________  EKG  Not indicated ____________________________________________  RADIOLOGY  Ct Abdomen Pelvis W Contrast  Result Date: 06/16/2016 CLINICAL DATA:  Left lower quadrant abdominal pain starting on Saturday. EXAM: CT ABDOMEN AND PELVIS WITH CONTRAST TECHNIQUE: Multidetector CT imaging of the abdomen and pelvis was performed using the standard protocol following bolus administration of intravenous contrast. CONTRAST:  131mL ISOVUE-300 IOPAMIDOL (ISOVUE-300) INJECTION 61%  COMPARISON:  None FINDINGS: Lower chest: Unremarkable Hepatobiliary: Unremarkable Pancreas: Unremarkable Spleen: Unremarkable Adrenals/Urinary Tract: Unremarkable Stomach/Bowel: Acute diverticulitis of the descending colon with an inflamed posterior diverticulum on image 47/2 and considerable surrounding mesenteric edema as well as adjacent fluid in the paracolic gutter on the left. No extraluminal gas or discrete abscess identified. There is wall thickening involving approximately 6 cm segment of the colon. Vascular/Lymphatic: Small reactive retroperitoneal and upper pelvic lymph nodes. Reproductive: Retroflexed uterus. Adnexa unremarkable aside from a 1.8 cm hypodense follicle or small cyst of the right ovary. Other: Trace free pelvic fluid in the cul-de-sac. Musculoskeletal: 1.7 cm geode in the left anterior acetabular roof. Levoconvex lumbar scoliosis. Multilevel posterior element fusion in the lower thoracic spine and lumbar spine extending down to L2. No malalignment. There is potentially some right subarticular lateral recess stenosis at the L4-5 level due to facet arthropathy and mild disc bulge. IMPRESSION: 1. Descending colon diverticulitis involving a 6 cm segment of the colon, with an inflamed diverticulum in surrounding mesenteric stranding as well as a small amount  of abnormal fluid in the left paracolic gutter. No abscess or extraluminal gas seen. 2. Multilevel posterior element fusion in the lower thoracic spine and upper lumbar spine. Electronically Signed   By: Van Clines M.D.   On: 06/16/2016 16:24    ____________________________________________   PROCEDURES  Procedure(s) performed: None  Procedures  Critical Care performed: No ____________________________________________   INITIAL IMPRESSION / ASSESSMENT AND PLAN / ED COURSE  Pertinent labs & imaging results that were available during my care of the patient were reviewed by me and considered in my medical decision making  (see chart for details).  35 y.o. nonpregnant female with 5 days constipation in 4 days of left lower quadrant pain. Overall, the patient is uncomfortable appearing but nontoxic. She is afebrile here. I'm concerned about diverticulitis or obstruction in this patient. We'll get a CT scan of the abdomen and initiate some dramatic treatment. Final disposition after workup and re-evaluation. ----------------------------------------- 5:13 PM on 06/16/2016 -----------------------------------------  The patient states that her pain has improved, but is still present with positional changes. I will retreat her pain. On CT scan, the patient does have diverticulitis on the descending colon with some additional mesenteric stranding. Given that she has had fevers, no elevated white blood cell count, and these findings on CT scan, I spoke with Dr. Renda Rolls who plans to admit the patient for IV antibiotics and continued monitoring. I discussed the findings and plan with the patient who is in agreement. ____________________________________________  FINAL CLINICAL IMPRESSION(S) / ED DIAGNOSES  Final diagnoses:  Diverticulitis of large intestine without bleeding, unspecified complication status    Clinical Course      NEW MEDICATIONS STARTED DURING THIS VISIT:  New Prescriptions   No medications on file      Eula Listen, MD 06/16/16 1714

## 2016-06-17 DIAGNOSIS — K5732 Diverticulitis of large intestine without perforation or abscess without bleeding: Secondary | ICD-10-CM | POA: Diagnosis not present

## 2016-06-17 LAB — CBC
HCT: 35.6 % (ref 35.0–47.0)
Hemoglobin: 11.9 g/dL — ABNORMAL LOW (ref 12.0–16.0)
MCH: 32.5 pg (ref 26.0–34.0)
MCHC: 33.6 g/dL (ref 32.0–36.0)
MCV: 96.9 fL (ref 80.0–100.0)
Platelets: 337 10*3/uL (ref 150–440)
RBC: 3.67 MIL/uL — ABNORMAL LOW (ref 3.80–5.20)
RDW: 12.6 % (ref 11.5–14.5)
WBC: 11.6 10*3/uL — ABNORMAL HIGH (ref 3.6–11.0)

## 2016-06-17 LAB — BASIC METABOLIC PANEL
Anion gap: 7 (ref 5–15)
BUN: <5 mg/dL — ABNORMAL LOW (ref 6–20)
CO2: 24 mmol/L (ref 22–32)
Calcium: 8.5 mg/dL — ABNORMAL LOW (ref 8.9–10.3)
Chloride: 104 mmol/L (ref 101–111)
Creatinine, Ser: 0.66 mg/dL (ref 0.44–1.00)
GFR calc Af Amer: >60 mL/min (ref >60)
GFR calc non Af Amer: >60 mL/min (ref >60)
Glucose, Bld: 103 mg/dL — ABNORMAL HIGH (ref 65–99)
Potassium: 3.9 mmol/L (ref 3.5–5.1)
Sodium: 135 mmol/L (ref 135–145)

## 2016-06-17 NOTE — Progress Notes (Signed)
Subjective:   She feels much better today. Her abdominal pain is improved and she is not nauseated. She has no fever. Her white blood cell count is close to normal. Her  Vital signs in last 24 hours: Temp:  [97.7 F (36.5 C)-98.7 F (37.1 C)] 97.7 F (36.5 C) (10/31 1300) Pulse Rate:  [80-100] 80 (10/31 1300) Resp:  [17-18] 18 (10/31 1300) BP: (102-130)/(56-87) 103/62 (10/31 1300) SpO2:  [96 %-100 %] 97 % (10/31 1300) Last BM Date: 06/15/16  Intake/Output from previous day: 10/30 0701 - 10/31 0700 In: [id number] [I.V.:725; IV Piggyback:1030] Out: 1150 [Urine:1150]  Exam:  Her abdominal exam is unremarkable with minimal left lower quadrant pain.  Lab Results:  CBC  Recent Labs  06/16/16 1156 06/17/16 0546  WBC 14.7* 11.6*  HGB 13.4 11.9*  HCT 40.2 35.6  PLT 437 337   CMP     Component Value Date/Time   NA 135 06/17/2016 0546   NA 139 08/17/2015 1105   K 3.9 06/17/2016 0546   CL 104 06/17/2016 0546   CO2 24 06/17/2016 0546   GLUCOSE 103 (H) 06/17/2016 0546   BUN <5 (L) 06/17/2016 0546   BUN 8 08/17/2015 1105   CREATININE 0.66 06/17/2016 0546   CALCIUM 8.5 (L) 06/17/2016 0546   PROT 7.8 06/16/2016 1156   PROT 7.4 08/17/2015 1105   ALBUMIN 4.1 06/16/2016 1156   ALBUMIN 4.7 08/17/2015 1105   AST 16 06/16/2016 1156   ALT 15 06/16/2016 1156   ALKPHOS 68 06/16/2016 1156   BILITOT 0.5 06/16/2016 1156   BILITOT 0.3 08/17/2015 1105   GFRNONAA >60 06/17/2016 0546   GFRAA >60 06/17/2016 0546   PT/INR No results for input(s): LABPROT, INR in the last 72 hours.  Studies/Results: Ct Abdomen Pelvis W Contrast  Result Date: 06/16/2016 CLINICAL DATA:  Left lower quadrant abdominal pain starting on Saturday. EXAM: CT ABDOMEN AND PELVIS WITH CONTRAST TECHNIQUE: Multidetector CT imaging of the abdomen and pelvis was performed using the standard protocol following bolus administration of intravenous contrast. CONTRAST:  126mL ISOVUE-300 IOPAMIDOL (ISOVUE-300) INJECTION 61%  COMPARISON:  None FINDINGS: Lower chest: Unremarkable Hepatobiliary: Unremarkable Pancreas: Unremarkable Spleen: Unremarkable Adrenals/Urinary Tract: Unremarkable Stomach/Bowel: Acute diverticulitis of the descending colon with an inflamed posterior diverticulum on image 47/2 and considerable surrounding mesenteric edema as well as adjacent fluid in the paracolic gutter on the left. No extraluminal gas or discrete abscess identified. There is wall thickening involving approximately 6 cm segment of the colon. Vascular/Lymphatic: Small reactive retroperitoneal and upper pelvic lymph nodes. Reproductive: Retroflexed uterus. Adnexa unremarkable aside from a 1.8 cm hypodense follicle or small cyst of the right ovary. Other: Trace free pelvic fluid in the cul-de-sac. Musculoskeletal: 1.7 cm geode in the left anterior acetabular roof. Levoconvex lumbar scoliosis. Multilevel posterior element fusion in the lower thoracic spine and lumbar spine extending down to L2. No malalignment. There is potentially some right subarticular lateral recess stenosis at the L4-5 level due to facet arthropathy and mild disc bulge. IMPRESSION: 1. Descending colon diverticulitis involving a 6 cm segment of the colon, with an inflamed diverticulum in surrounding mesenteric stranding as well as a small amount of abnormal fluid in the left paracolic gutter. No abscess or extraluminal gas seen. 2. Multilevel posterior element fusion in the lower thoracic spine and upper lumbar spine. Electronically Signed   By: Van Clines M.D.   On: 06/16/2016 16:24    Assessment/Plan: We will advance her diet and continue her IV antibiotics with anticipation of discharge  tomorrow on oral antibiotics. This plan was discussed with her in detail and she is in agreement.

## 2016-06-18 DIAGNOSIS — K5732 Diverticulitis of large intestine without perforation or abscess without bleeding: Secondary | ICD-10-CM | POA: Diagnosis not present

## 2016-06-18 MED ORDER — FAMOTIDINE 20 MG PO TABS
20.0000 mg | ORAL_TABLET | Freq: Two times a day (BID) | ORAL | Status: DC
  Administered 2016-06-18 – 2016-06-19 (×2): 20 mg via ORAL
  Filled 2016-06-18 (×2): qty 1

## 2016-06-18 NOTE — Progress Notes (Signed)
Subjective:   She's feeling better but is still not had a bowel movement. She has some mild nausea. She is able to eat fairly well. She's not been up and active and really not been ambulating in all.  Vital signs in last 24 hours: Temp:  [97.5 F (36.4 C)-98.6 F (37 C)] 98.6 F (37 C) (11/01 1228) Pulse Rate:  [74-86] 78 (11/01 1228) Resp:  [16-18] 16 (11/01 1228) BP: (113-119)/(68-75) 118/74 (11/01 1228) SpO2:  [98 %-100 %] 100 % (11/01 1228) Last BM Date: 06/14/16  Intake/Output from previous day: 10/31 0701 - 11/01 0700 In: 2490 [P.O.:300; I.V.:2190] Out: 1200 [Urine:1200]  Exam:  Her abdomen is much improved with less abdominal pain no tenderness no rebound no guarding. Her lungs are clear with no adventitious sounds and she has normal pulmonary excursion.  Lab Results:  CBC  Recent Labs  06/16/16 1156 06/17/16 0546  WBC 14.7* 11.6*  HGB 13.4 11.9*  HCT 40.2 35.6  PLT 437 337   CMP     Component Value Date/Time   NA 135 06/17/2016 0546   NA 139 08/17/2015 1105   K 3.9 06/17/2016 0546   CL 104 06/17/2016 0546   CO2 24 06/17/2016 0546   GLUCOSE 103 (H) 06/17/2016 0546   BUN <5 (L) 06/17/2016 0546   BUN 8 08/17/2015 1105   CREATININE 0.66 06/17/2016 0546   CALCIUM 8.5 (L) 06/17/2016 0546   PROT 7.8 06/16/2016 1156   PROT 7.4 08/17/2015 1105   ALBUMIN 4.1 06/16/2016 1156   ALBUMIN 4.7 08/17/2015 1105   AST 16 06/16/2016 1156   ALT 15 06/16/2016 1156   ALKPHOS 68 06/16/2016 1156   BILITOT 0.5 06/16/2016 1156   BILITOT 0.3 08/17/2015 1105   GFRNONAA >60 06/17/2016 0546   GFRAA >60 06/17/2016 0546   PT/INR No results for input(s): LABPROT, INR in the last 72 hours.  Studies/Results: No results found.  Assessment/Plan: Overall she's better. She does not want to go home until she has a bowel movement and I'll continue her IV antibiotics while she is in the hospital.

## 2016-06-18 NOTE — Progress Notes (Signed)
Key Points: Use following P&T approved IV to PO antibiotic change policy.  Description contains the criteria that are approved Note: Policy Excludes:  Esophagectomy patientsPHARMACIST - PHYSICIAN COMMUNICATION DR:   Pat Patrick CONCERNING: IV to Oral Route Change Policy  RECOMMENDATION: This patient is receiving famotidine by the intravenous route.  Based on criteria approved by the Pharmacy and Therapeutics Committee, the intravenous medication(s) is/are being converted to the equivalent oral dose form(s).   DESCRIPTION: These criteria include:  The patient is eating (either orally or via tube) and/or has been taking other orally administered medications for a least 24 hours  The patient has no evidence of active gastrointestinal bleeding or impaired GI absorption (gastrectomy, short bowel, patient on TNA or NPO).  If you have questions about this conversion, please contact the [place], [facility] 06/18/2016 10:58 AM

## 2016-06-19 DIAGNOSIS — K5732 Diverticulitis of large intestine without perforation or abscess without bleeding: Secondary | ICD-10-CM | POA: Diagnosis not present

## 2016-06-19 MED ORDER — HYDROCODONE-ACETAMINOPHEN 5-325 MG PO TABS: 1.0000 | ORAL_TABLET | ORAL | 0 refills | Status: DC | PRN

## 2016-06-19 MED ORDER — ONDANSETRON 4 MG PO TBDP: 4.0000 mg | ORAL_TABLET | Freq: Four times a day (QID) | ORAL | 0 refills | Status: DC | PRN

## 2016-06-19 MED ORDER — AMOXICILLIN-POT CLAVULANATE 875-125 MG PO TABS: 1.0000 | ORAL_TABLET | Freq: Two times a day (BID) | ORAL | 0 refills | Status: DC

## 2016-06-19 NOTE — Discharge Summary (Signed)
Patient ID: Krystal Blackwell MRN: [id number] DOB/AGE: 20-Nov-1980 35 y.o.  Admit date: 06/16/2016 Discharge date: 06/19/2016  Discharge Diagnoses:  Diverticulitis  Procedures Performed: None  Discharged Condition: good  Hospital Course: 35 year old female admitted with diverticulitis. She had gradual improvement in her symptoms on IV antibiotics. Was able be pain-free and tolerating a diet prior to being discharged.  Discharge Orders:  discharge home  Disposition: 01-Home or Self Care  Discharge Medications:   Medication List    TAKE these medications   amoxicillin-clavulanate 875-125 MG tablet Commonly known as:  AUGMENTIN Take 1 tablet by mouth 2 (two) times daily.   B-12 COMPLIANCE INJECTION IJ Inject 1 mL as directed every 30 (thirty) days. Reported on 08/17/2015   buPROPion 150 MG 12 hr tablet Commonly known as:  WELLBUTRIN SR Take 1 tablet (150 mg total) by mouth 2 (two) times daily.   etonogestrel-ethinyl estradiol 0.12-0.015 MG/24HR vaginal ring Commonly known as:  NUVARING Insert vaginally and leave in place for 3 consecutive weeks, then remove for 1 week.   HYDROcodone-acetaminophen 5-325 MG tablet Commonly known as:  NORCO/VICODIN Take 1-2 tablets by mouth every 4 (four) hours as needed for moderate pain.   multivitamin tablet Take 1 tablet by mouth daily.   ondansetron 4 MG disintegrating tablet Commonly known as:  ZOFRAN-ODT Take 1 tablet (4 mg total) by mouth every 6 (six) hours as needed for nausea.   pseudoephedrine-guaifenesin 60-600 MG 12 hr tablet Commonly known as:  MUCINEX D Take 1 tablet by mouth every 12 (twelve) hours.   Vitamin D (Ergocalciferol) 50000 units Caps capsule Commonly known as:  DRISDOL Take 1 capsule (50,000 Units total) by mouth once a week.        Follwup: Follow-up [facility]. Schedule an appointment as soon as possible for a visit in 1 week(s).   Specialty:   General Surgery Why:  follow up diverticulitis Contact information: [place] [phone]          Signed: Clayburn Pert 06/19/2016, 2:06 PM

## 2016-06-19 NOTE — Progress Notes (Signed)
IV was removed. Discharge instructions, follow-up appointments, and prescriptions were provided to the pt and spouse at bedside. The pt was taken downstairs via wheelchair by volunteer services.

## 2016-06-19 NOTE — Final Progress Note (Signed)
CC: Diverticulitis Subjective: Patient reports feeling better today. She has been ambulating without difficulty and tolerating a diet. She has been passing flatus but denies any bowel movement yet. States she desires to go home.  Objective: Vital signs in last 24 hours: Temp:  [97.7 F (36.5 C)-98 F (36.7 C)] 98 F (36.7 C) (11/02 0836) Pulse Rate:  [77-82] 82 (11/02 0836) Resp:  [16-18] 16 (11/02 0836) BP: (115-119)/(71-84) 115/72 (11/02 0836) SpO2:  [99 %-100 %] 99 % (11/02 0836) Last BM Date: 06/18/16  Intake/Output from previous day: 11/01 0701 - 11/02 0700 In: 1533.8 [P.O.:600; I.V.:883.8; IV Piggyback:50] Out: 2500 [Urine:2500] Intake/Output this shift: Total I/O In: 240 [P.O.:240] Out: 800 [Urine:800]  Physical exam:  Gen.: No acute distress Chest: Clear to auscultation Heart: Regular rate and rhythm Abdomen: Soft, nontender, nondistended  Lab Results: CBC   Recent Labs  06/17/16 0546  WBC 11.6*  HGB 11.9*  HCT 35.6  PLT 337   BMET  Recent Labs  06/17/16 0546  NA 135  K 3.9  CL 104  CO2 24  GLUCOSE 103*  BUN <5*  CREATININE 0.66  CALCIUM 8.5*   PT/INR No results for input(s): LABPROT, INR in the last 72 hours. ABG No results for input(s): PHART, HCO3 in the last 72 hours.  Invalid input(s): PCO2, PO2  Studies/Results: No results found.  Anti-infectives: Anti-infectives    Start     Dose/Rate Route Frequency Ordered Stop   06/19/16 0000  amoxicillin-clavulanate (AUGMENTIN) 875-125 MG tablet     1 tablet Oral 2 times daily 06/19/16 1323     06/17/16 0000  piperacillin-tazobactam (ZOSYN) IVPB 3.375 g     3.375 g 12.5 mL/hr over 240 Minutes Intravenous Every 8 hours 06/16/16 2207     06/16/16 1715  piperacillin-tazobactam (ZOSYN) IVPB 3.375 g     3.375 g 100 mL/hr over 30 Minutes Intravenous  Once 06/16/16 1713 06/16/16 1825      Assessment/Plan:  35 year old female admitted for acute diverticulitis. As clinically improved on IV  antibiotics. She desires to go home. She'll be discharged home on oral antibiotics and will follow-up in the surgery clinic in 1 week for repeat examination. Should her symptoms return or she fails we'll tolerated diet at home she is to return to the emergency department immediately.  Jocelynne Duquette T. Adonis Huguenin, MD, FACS  06/19/2016

## 2016-06-19 NOTE — Discharge Instructions (Signed)
Diverticulitis °Diverticulitis is inflammation or infection of small pouches in your colon that form when you have a condition called diverticulosis. The pouches in your colon are called diverticula. Your colon, or large intestine, is where water is absorbed and stool is formed. °Complications of diverticulitis can include: °· Bleeding. °· Severe infection. °· Severe pain. °· Perforation of your colon. °· Obstruction of your colon. °CAUSES  °Diverticulitis is caused by bacteria. °Diverticulitis happens when stool becomes trapped in diverticula. This allows bacteria to grow in the diverticula, which can lead to inflammation and infection. °RISK FACTORS °People with diverticulosis are at risk for diverticulitis. Eating a diet that does not include enough fiber from fruits and vegetables may make diverticulitis more likely to develop. °SYMPTOMS  °Symptoms of diverticulitis may include: °· Abdominal pain and tenderness. The pain is normally located on the left side of the abdomen, but may occur in other areas. °· Fever and chills. °· Bloating. °· Cramping. °· Nausea. °· Vomiting. °· Constipation. °· Diarrhea. °· Blood in your stool. °DIAGNOSIS  °Your health care provider will ask you about your medical history and do a physical exam. You may need to have tests done because many medical conditions can cause the same symptoms as diverticulitis. Tests may include: °· Blood tests. °· Urine tests. °· Imaging tests of the abdomen, including X-rays and CT scans. °When your condition is under control, your health care provider may recommend that you have a colonoscopy. A colonoscopy can show how severe your diverticula are and whether something else is causing your symptoms. °TREATMENT  °Most cases of diverticulitis are mild and can be treated at home. Treatment may include: °· Taking over-the-counter pain medicines. °· Following a clear liquid diet. °· Taking antibiotic medicines by mouth for 7-10 days. °More severe cases may  be treated at a hospital. Treatment may include: °· Not eating or drinking. °· Taking prescription pain medicine. °· Receiving antibiotic medicines through an IV tube. °· Receiving fluids and nutrition through an IV tube. °· Surgery. °HOME CARE INSTRUCTIONS  °· Follow your health care provider's instructions carefully. °· Follow a full liquid diet or other diet as directed by your health care provider. After your symptoms improve, your health care provider may tell you to change your diet. He or she may recommend you eat a high-fiber diet. Fruits and vegetables are good sources of fiber. Fiber makes it easier to pass stool. °· Take fiber supplements or probiotics as directed by your health care provider. °· Only take medicines as directed by your health care provider. °· Keep all your follow-up appointments. °SEEK MEDICAL CARE IF:  °· Your pain does not improve. °· You have a hard time eating food. °· Your bowel movements do not return to normal. °SEEK IMMEDIATE MEDICAL CARE IF:  °· Your pain becomes worse. °· Your symptoms do not get better. °· Your symptoms suddenly get worse. °· You have a fever. °· You have repeated vomiting. °· You have bloody or black, tarry stools. °MAKE SURE YOU:  °· Understand these instructions. °· Will watch your condition. °· Will get help right away if you are not doing well or get worse. °  °This information is not intended to replace advice given to you by your health care provider. Make sure you discuss any questions you have with your health care provider. °  °Document Released: 05/14/2005 Document Revised: 08/09/2013 Document Reviewed: 06/29/2013 °Elsevier Interactive Patient Education ©2016 [employer] ° °

## 2016-06-25 ENCOUNTER — Inpatient Hospital Stay: Payer: BC Managed Care – PPO | Admitting: Gastroenterology

## 2016-06-25 ENCOUNTER — Other Ambulatory Visit: Payer: Self-pay

## 2016-07-07 ENCOUNTER — Emergency Department
Admission: EM | Admit: 2016-07-07 | Discharge: 2016-07-07 | Disposition: A | Discharge status: Home / Self Care | Payer: BC Managed Care – PPO | Source: Home / Self Care

## 2016-07-07 ENCOUNTER — Ambulatory Visit
Admission: RE | Admit: 2016-07-07 | Discharge: 2016-07-07 | Disposition: A | Discharge status: Home / Self Care | Payer: BC Managed Care – PPO | Source: Ambulatory Visit | Attending: Surgery | Admitting: Surgery

## 2016-07-07 ENCOUNTER — Other Ambulatory Visit
Admission: RE | Admit: 2016-07-07 | Discharge: 2016-07-07 | Disposition: A | Discharge status: Home / Self Care | Payer: BC Managed Care – PPO | Source: Ambulatory Visit | Attending: Surgery | Admitting: Surgery

## 2016-07-07 ENCOUNTER — Telehealth: Payer: Self-pay | Admitting: Surgery

## 2016-07-07 ENCOUNTER — Telehealth: Payer: Self-pay

## 2016-07-07 ENCOUNTER — Encounter: Payer: Self-pay | Admitting: Emergency Medicine

## 2016-07-07 DIAGNOSIS — Z5321 Procedure and treatment not carried out due to patient leaving prior to being seen by health care provider: Secondary | ICD-10-CM

## 2016-07-07 DIAGNOSIS — F17200 Nicotine dependence, unspecified, uncomplicated: Secondary | ICD-10-CM | POA: Insufficient documentation

## 2016-07-07 DIAGNOSIS — K639 Disease of intestine, unspecified: Secondary | ICD-10-CM | POA: Diagnosis not present

## 2016-07-07 DIAGNOSIS — R1030 Lower abdominal pain, unspecified: Secondary | ICD-10-CM | POA: Insufficient documentation

## 2016-07-07 DIAGNOSIS — R109 Unspecified abdominal pain: Secondary | ICD-10-CM

## 2016-07-07 LAB — COMPREHENSIVE METABOLIC PANEL WITH GFR
ALT: 14 U/L (ref 14–54)
AST: 15 U/L (ref 15–41)
Albumin: 3.8 g/dL (ref 3.5–5.0)
Alkaline Phosphatase: 38 U/L (ref 38–126)
Anion gap: 8 (ref 5–15)
BUN: 6 mg/dL (ref 6–20)
CO2: 23 mmol/L (ref 22–32)
Calcium: 8.9 mg/dL (ref 8.9–10.3)
Chloride: 105 mmol/L (ref 101–111)
Creatinine, Ser: 0.82 mg/dL (ref 0.44–1.00)
GFR calc Af Amer: >60 mL/min
GFR calc non Af Amer: >60 mL/min
Glucose, Bld: 100 mg/dL — ABNORMAL HIGH (ref 65–99)
Potassium: 3.7 mmol/L (ref 3.5–5.1)
Sodium: 136 mmol/L (ref 135–145)
Total Bilirubin: 0.3 mg/dL (ref 0.3–1.2)
Total Protein: 7.1 g/dL (ref 6.5–8.1)

## 2016-07-07 LAB — CBC WITH DIFFERENTIAL/PLATELET
Basophils Absolute: 0 10*3/uL (ref 0–0.1)
Basophils Relative: 1 %
Eosinophils Absolute: 0.1 10*3/uL (ref 0–0.7)
Eosinophils Relative: 1 %
HCT: 35.3 % (ref 35.0–47.0)
Hemoglobin: 12 g/dL (ref 12.0–16.0)
Lymphocytes Relative: 37 %
Lymphs Abs: 2.2 10*3/uL (ref 1.0–3.6)
MCH: 32.4 pg (ref 26.0–34.0)
MCHC: 34 g/dL (ref 32.0–36.0)
MCV: 95.2 fL (ref 80.0–100.0)
Monocytes Absolute: 0.4 10*3/uL (ref 0.2–0.9)
Monocytes Relative: 7 %
Neutro Abs: 3.3 10*3/uL (ref 1.4–6.5)
Neutrophils Relative %: 54 %
Platelets: 424 10*3/uL (ref 150–440)
RBC: 3.71 MIL/uL — ABNORMAL LOW (ref 3.80–5.20)
RDW: 13.3 % (ref 11.5–14.5)
WBC: 6.1 10*3/uL (ref 3.6–11.0)

## 2016-07-07 MED ORDER — IOPAMIDOL (ISOVUE-300) INJECTION 61%
100.0000 mL | Freq: Once | INTRAVENOUS | Status: AC | PRN
  Administered 2016-07-07: 100 mL via INTRAVENOUS

## 2016-07-07 NOTE — Telephone Encounter (Signed)
Patient states that she feels like she is having a flair up of her Diverticulitis. She has recently taken an antibiotic (Augmentin) ending on 06/30/16.   Spoke with Dr. Dahlia Byes and we will get a CBC, CMP, and CT Scan of this patient STAT and see her in the office as soon as this is done.  Spoke with patient. She is agreeable to plan.  Orders placed.  Patient is to go to the [facility] at this time for labs and CT Scan. She will follow-up in the am at [facility].

## 2016-07-07 NOTE — Telephone Encounter (Signed)
Spoke with CT scan tech about this patient at 7:55pm.  Patient called office today stating felt like having diverticulitis flair.  She had labs and CT scan ordered.  The CT tech called and read the report from the imaging to me.    IMPRESSION: Focal soft tissue inflammation at the distal descending colon, with trace free fluid, likely reflecting mild acute diverticulitis. Mild diverticulosis along the descending colon. Electronically Signed   By: Garald Balding M.D.   On: 07/07/2016 19:20  CBC    Component Value Date/Time   WBC 6.1 07/07/2016 1703   RBC 3.71 (L) 07/07/2016 1703   HGB 12.0 07/07/2016 1703   HCT 35.3 07/07/2016 1703   HCT 36.7 03/18/2016 1504   PLT 424 07/07/2016 1703   PLT 523 (H) 03/18/2016 1504   MCV 95.2 07/07/2016 1703   MCV 97 03/18/2016 1504   MCH 32.4 07/07/2016 1703   MCHC 34.0 07/07/2016 1703   RDW 13.3 07/07/2016 1703   RDW 13.4 03/18/2016 1504   LYMPHSABS 2.2 07/07/2016 1703   MONOABS 0.4 07/07/2016 1703   EOSABS 0.1 07/07/2016 1703   BASOSABS 0.0 07/07/2016 1703   CMP Latest Ref Rng & Units 07/07/2016 06/17/2016 06/16/2016  Glucose 65 - 99 mg/dL 100(H) 103(H) 94  BUN 6 - 20 mg/dL 6 <5(L) 7  Creatinine 0.44 - 1.00 mg/dL 0.82 0.66 0.65  Sodium 135 - 145 mmol/L 136 135 130(L)  Potassium 3.5 - 5.1 mmol/L 3.7 3.9 3.9  Chloride 101 - 111 mmol/L 105 104 98(L)  CO2 22 - 32 mmol/L 23 24 23   Calcium 8.9 - 10.3 mg/dL 8.9 8.5(L) 9.4  Total Protein 6.5 - 8.1 g/dL 7.1 - 7.8  Total Bilirubin 0.3 - 1.2 mg/dL 0.3 - 0.5  Alkaline Phos 38 - 126 U/L 38 - 68  AST 15 - 41 U/L 15 - 16  ALT 14 - 54 U/L 14 - 15     I discussed with the CT scan tech to instruct patient that if she feels she is hurting too badly to wait until her 8:30am appointment with Dr. Dahlia Byes that she should go to the ED.  CT tech is going to relay the message to the patient.

## 2016-07-07 NOTE — ED Notes (Signed)
Dr. Heath Lark contacted pt to be seen by EDP. She has scheduled appmt with surgeon in am for diverticulitis.

## 2016-07-07 NOTE — ED Triage Notes (Signed)
Pt ambulatory to triage with steady gait, pt reports had outpatient CT scan performed and was told to come to ED, pt reports hx of diverticulitis. Pt had blood work and CT scan performed today.

## 2016-07-08 ENCOUNTER — Encounter: Payer: Self-pay | Admitting: Surgery

## 2016-07-08 ENCOUNTER — Ambulatory Visit (INDEPENDENT_AMBULATORY_CARE_PROVIDER_SITE_OTHER): Payer: BC Managed Care – PPO | Admitting: Surgery

## 2016-07-08 ENCOUNTER — Other Ambulatory Visit: Payer: Self-pay

## 2016-07-08 VITALS — BP 121/82 | HR 78 | Temp 98.4°F | Wt 154.0 lb

## 2016-07-08 DIAGNOSIS — K5732 Diverticulitis of large intestine without perforation or abscess without bleeding: Secondary | ICD-10-CM

## 2016-07-08 MED ORDER — CIPROFLOXACIN HCL 500 MG PO TABS: 500.0000 mg | ORAL_TABLET | Freq: Two times a day (BID) | ORAL | 0 refills | Status: DC

## 2016-07-08 MED ORDER — METRONIDAZOLE 500 MG PO TABS: 500.0000 mg | ORAL_TABLET | Freq: Three times a day (TID) | ORAL | 0 refills | Status: DC

## 2016-07-08 NOTE — Patient Instructions (Addendum)
Your antibiotics have been sent to the pharmacy. Please pick them up and start today.  We will see you back in the office as scheduled below.  Please avoid nuts, seeds, corn, and popcorn as much as possible. If you do indeed have to eat these things every so often, make sure that you chew these up as much as possible.  Increase your water intake to approximately 72 oz. Daily. (This is approximately 9- 8oz. Glasses) to help avoid constipation.   Increase your fiber intake as much as possible (see diet below) and you may also use a stool softener (Colace / Docusate Sodium) to help soften stool as well as a mild laxative (Miralax / Polyethylene Glycol) to help you pass your stool. Your goal with bowel movements are 1-2 soft formed bowel movements daily.  If you begin having the same abdominal pain, nausea, vomiting, or fever - Please call our office immediately so that we may send you in some antibiotics to your pharmacy and we will have you follow-up the following day with our surgeon in the office.   Diverticulitis Diverticulitis is inflammation or infection of small pouches in your colon that form when you have a condition called diverticulosis. The pouches in your colon are called diverticula. Your colon, or large intestine, is where water is absorbed and stool is formed. Complications of diverticulitis can include:  Bleeding.  Severe infection.  Severe pain.  Perforation of your colon.  Obstruction of your colon. CAUSES  Diverticulitis is caused by bacteria. Diverticulitis happens when stool becomes trapped in diverticula. This allows bacteria to grow in the diverticula, which can lead to inflammation and infection. RISK FACTORS People with diverticulosis are at risk for diverticulitis. Eating a diet that does not include enough fiber from fruits and vegetables may make diverticulitis more likely to develop. SYMPTOMS  Symptoms of diverticulitis may include:  Abdominal pain and  tenderness. The pain is normally located on the left side of the abdomen, but may occur in other areas.  Fever and chills.  Bloating.  Cramping.  Nausea.  Vomiting.  Constipation.  Diarrhea.  Blood in your stool. DIAGNOSIS  Your health care provider will ask you about your medical history and do a physical exam. You may need to have tests done because many medical conditions can cause the same symptoms as diverticulitis. Tests may include:  Blood tests.  Urine tests.  Imaging tests of the abdomen, including X-rays and CT scans. When your condition is under control, your health care provider may recommend that you have a colonoscopy. A colonoscopy can show how severe your diverticula are and whether something else is causing your symptoms. TREATMENT  Most cases of diverticulitis are mild and can be treated at home. Treatment may include:  Taking over-the-counter pain medicines.  Following a clear liquid diet.  Taking antibiotic medicines by mouth for 7-10 days. More severe cases may be treated at a hospital. Treatment may include:  Not eating or drinking.  Taking prescription pain medicine.  Receiving antibiotic medicines through an IV tube.  Receiving fluids and nutrition through an IV tube.  Surgery. HOME CARE INSTRUCTIONS   Follow your health care provider's instructions carefully.  Follow a full liquid diet or other diet as directed by your health care provider. After your symptoms improve, your health care provider may tell you to change your diet. He or she may recommend you eat a high-fiber diet. Fruits and vegetables are good sources of fiber. Fiber makes it easier to pass  stool.  Take fiber supplements or probiotics as directed by your health care provider.  Only take medicines as directed by your health care provider.  Keep all your follow-up appointments. SEEK MEDICAL CARE IF:   Your pain does not improve.  You have a hard time eating  food.  Your bowel movements do not return to normal. SEEK IMMEDIATE MEDICAL CARE IF:   Your pain becomes worse.  Your symptoms do not get better.  Your symptoms suddenly get worse.  You have a fever.  You have repeated vomiting.  You have bloody or black, tarry stools. MAKE SURE YOU:   Understand these instructions.  Will watch your condition.  Will get help right away if you are not doing well or get worse.   This information is not intended to replace advice given to you by your health care provider. Make sure you discuss any questions you have with your health care provider.   Document Released: 05/14/2005 Document Revised: 08/09/2013 Document Reviewed: 06/29/2013 Elsevier Interactive Patient Education 2016 Elsevier Inc.   High-Fiber Diet Fiber, also called dietary fiber, is a type of carbohydrate found in fruits, vegetables, whole grains, and beans. A high-fiber diet can have many health benefits. Your health care provider may recommend a high-fiber diet to help:  Prevent constipation. Fiber can make your bowel movements more regular.  Lower your cholesterol.  Relieve hemorrhoids, uncomplicated diverticulosis, or irritable bowel syndrome.  Prevent overeating as part of a weight-loss plan.  Prevent heart disease, type 2 diabetes, and certain cancers. WHAT IS MY PLAN? The recommended daily intake of fiber includes:  38 grams for men under age 72.  5 grams for men over age 59.  31 grams for women under age 54.  60 grams for women over age 37. You can get the recommended daily intake of dietary fiber by eating a variety of fruits, vegetables, grains, and beans. Your health care provider may also recommend a fiber supplement if it is not possible to get enough fiber through your diet. WHAT DO I NEED TO KNOW ABOUT A HIGH-FIBER DIET?  Fiber supplements have not been widely studied for their effectiveness, so it is better to get fiber through food  sources.  Always check the fiber content on thenutrition facts label of any prepackaged food. Look for foods that contain at least 5 grams of fiber per serving.  Ask your dietitian if you have questions about specific foods that are related to your condition, especially if those foods are not listed in the following section.  Increase your daily fiber consumption gradually. Increasing your intake of dietary fiber too quickly may cause bloating, cramping, or gas.  Drink plenty of water. Water helps you to digest fiber. WHAT FOODS CAN I EAT? Grains Whole-grain breads. Multigrain cereal. Oats and oatmeal. Brown rice. Barley. Bulgur wheat. [place]. Bran muffins. Popcorn. Rye wafer crackers. Vegetables Sweet potatoes. Spinach. Kale. Artichokes. Cabbage. Broccoli. Green peas. Carrots. Squash. Fruits Berries. Pears. Apples. Oranges. Avocados. Prunes and raisins. Dried figs. Meats and Other Protein Sources [place], kidney, pinto, and soy beans. Split peas. Lentils. Nuts and seeds. Dairy Fiber-fortified yogurt. Beverages Fiber-fortified soy milk. Fiber-fortified orange juice. Other Fiber bars. The items listed above may not be a complete list of recommended foods or beverages. Contact your dietitian for more options. WHAT FOODS ARE NOT RECOMMENDED? Grains White bread. Pasta made with refined flour. White rice. Vegetables Fried potatoes. Canned vegetables. Well-cooked vegetables.  Fruits Fruit juice. Cooked, strained fruit. Meats and Other Protein Sources  Fatty cuts of meat. Fried Sales executive or fried fish. Dairy Milk. Yogurt. Cream cheese. Sour cream. Beverages Soft drinks. Other Cakes and pastries. Butter and oils. The items listed above may not be a complete list of foods and beverages to avoid. Contact your dietitian for more information. WHAT ARE SOME TIPS FOR INCLUDING HIGH-FIBER FOODS IN MY DIET?  Eat a wide variety of high-fiber foods.  Make sure that half of all grains consumed  each day are whole grains.  Replace breads and cereals made from refined flour or white flour with whole-grain breads and cereals.  Replace white rice with brown rice, bulgur wheat, or millet.  Start the day with a breakfast that is high in fiber, such as a cereal that contains at least 5 grams of fiber per serving.  Use beans in place of meat in soups, salads, or pasta.  Eat high-fiber snacks, such as berries, and raw vegetables.   This information is not intended to replace advice given to you by your health care provider. Make sure you discuss any questions you have with your health care provider.   Document Released: 08/04/2005 Document Revised: 08/25/2014 Document Reviewed: 01/17/2014 Elsevier Interactive Patient Education [employer].

## 2016-07-08 NOTE — Progress Notes (Signed)
Following for uncomplicated diverticulitis that required  Admission to the hospital w IV a/bs a few weeks ago. Comes for increased in pain suprapubic area and LLQ, no fevers. CT scan reviewed showing some remaining diverticulitis, no perforation, no free air or abscess Cbc nml  PE NAD Abd: soft, mild TTP LLQ, no peritonitis  A/P Recurrent diverticulitis cipro and flagyl 2 weeks No surgical intervention RTC 3 weeks Time spent 25 minutes with the majority in time spent in coordination of care and counseling May need colonoscopy in near future

## 2016-07-14 ENCOUNTER — Telehealth: Payer: Self-pay | Admitting: Surgery

## 2016-07-14 NOTE — Telephone Encounter (Signed)
Seen for diverticulitis on the 21st.. No pain until this morning and still on antibiotic. Mild pain but ate clean and concerned. Her phone needs to be charged so please feel free to leave a message on her phone.

## 2016-07-15 NOTE — Telephone Encounter (Signed)
Phone call returned to patient at this time to triage via phone. No answer. Left voicemail for return phone call.

## 2016-07-16 NOTE — Telephone Encounter (Signed)
Returned phone call once again to Krystal Blackwell. Unable to contact patient.  I will be happy to speak with patient should she return phone call.

## 2016-07-17 ENCOUNTER — Encounter: Payer: Self-pay | Admitting: Gastroenterology

## 2016-07-17 ENCOUNTER — Ambulatory Visit: Payer: BC Managed Care – PPO | Admitting: Physician Assistant

## 2016-07-17 ENCOUNTER — Ambulatory Visit (INDEPENDENT_AMBULATORY_CARE_PROVIDER_SITE_OTHER): Payer: BC Managed Care – PPO | Admitting: Gastroenterology

## 2016-07-17 VITALS — BP 100/78 | HR 84 | Ht 61.0 in | Wt 151.5 lb

## 2016-07-17 DIAGNOSIS — K5732 Diverticulitis of large intestine without perforation or abscess without bleeding: Secondary | ICD-10-CM

## 2016-07-17 DIAGNOSIS — R933 Abnormal findings on diagnostic imaging of other parts of digestive tract: Secondary | ICD-10-CM

## 2016-07-17 MED ORDER — NA SULFATE-K SULFATE-MG SULF 17.5-3.13-1.6 GM/177ML PO SOLN: 1.0000 | Freq: Once | ORAL | 0 refills | Status: AC

## 2016-07-17 NOTE — Patient Instructions (Signed)
You have been scheduled for a colonoscopy. Please follow written instructions given to you at your visit today.  Please pick up your prep supplies at the pharmacy within the next 1-3 days. If you use inhalers (even only as needed), please bring them with you on the day of your procedure. Your physician has requested that you go to www.startemmi.com and enter the access code given to you at your visit today. This web site gives a general overview about your procedure. However, you should still follow specific instructions given to you by our office regarding your preparation for the procedure.  You can start a high fiber diet in 3 weeks.   Thank you for choosing me and [facility].  Pricilla Riffle. Dagoberto Ligas., MD., Marval Regal

## 2016-07-17 NOTE — Progress Notes (Signed)
History of Present Illness: This is a 35 year old female referred by Krystal Sheldon, PA-C for the evaluation of diverticulitis. She was hospitalized at [facility] for 3 days on October 30 for left lower quadrant pain with CT showing inflammatory changes around the descending colon consistent with acute diverticulitis. She was hospitalized for 3 days with IV antibiotics and then treated with 7 days of oral antibiotics. On November 20 her left lower quadrant pain returned. Repeat CT scan was performed which was reviewed and showed improving inflammatory changes around the descending colon. She was treated with a 10 day course of Cipro and Flagyl with resolution of her symptoms. She noted mild left lower quadrant pain after eating a hamburger on Sunday and this resolved over the course of the day. She noted constipation around her initial episode of diverticulitis however her bowel movements have been moving more normally the past 2 weeks. She has no prior history of gastrointestinal problems. Denies weight loss, diarrhea, change in stool caliber, melena, hematochezia, nausea, vomiting, dysphagia, reflux symptoms, chest pain.   Allergies  Allergen Reactions  . Avelox [Moxifloxacin Hcl In Nacl]    Outpatient Medications Prior to Visit  Medication Sig Dispense Refill  . ciprofloxacin (CIPRO) 500 MG tablet Take 1 tablet (500 mg total) by mouth 2 (two) times daily. 28 tablet 0  . Cyanocobalamin (B-12 COMPLIANCE INJECTION IJ) Inject 1 mL as directed every 30 (thirty) days. Reported on 08/17/2015    . etonogestrel-ethinyl estradiol (NUVARING) 0.12-0.015 MG/24HR vaginal ring Insert vaginally and leave in place for 3 consecutive weeks, then remove for 1 week. 1 each 12  . fluticasone (FLONASE) 50 MCG/ACT nasal spray PLACE 2 SPRAYS INTO BOTH NOSTRILS DAILY.  11  . metroNIDAZOLE (FLAGYL) 500 MG tablet Take 1 tablet (500 mg total) by mouth 3 (three) times daily. 42 tablet 0  . Multiple Vitamin (MULTIVITAMIN) tablet  Take 1 tablet by mouth daily. NOT TAKING AT THE MOMENT - INTERACTS WITH CIPRO    . Probiotic Product (SOLUBLE FIBER/PROBIOTICS PO) Take 1 tablet by mouth 1 day or 1 dose.    . Vitamin D, Ergocalciferol, (DRISDOL) 50000 units CAPS capsule Take 1 capsule (50,000 Units total) by mouth once a week. 12 capsule 4  . amoxicillin-clavulanate (AUGMENTIN) 875-125 MG tablet Take 1 tablet by mouth 2 (two) times daily. 20 tablet 0   Facility-Administered Medications Prior to Visit  Medication Dose Route Frequency Provider Last Rate Last Dose  . cyanocobalamin ((VITAMIN B-12)) injection 1,000 mcg  1,000 mcg Intramuscular Once Melody Rockney Ghee, CNM       Past Medical History:  Diagnosis Date  . Blood transfusion without reported diagnosis    [id number]  . Depression   . Pelvic pain in female   . Recurrent sinus infections   . Scoliosis    Past Surgical History:  Procedure Laterality Date  . NASAL SEPTUM SURGERY  11/2013  . SPINE SURGERY  97,98,99,00   scoliosis   Social History   Social History  . Marital status: Legally Separated    Spouse name: N/A  . Number of children: 2  . Years of education: N/A   Occupational History  . EC Teacher    Social History Main Topics  . Smoking status: Current Some Day Smoker  . Smokeless tobacco: Never Used     Comment: 2-3 CIGARETTES EVERY TWO WEEKS  . Alcohol use 3.6 oz/week    6 Cans of beer per week     Comment: SOCIALLY  .  Drug use: No  . Sexual activity: Yes    Birth control/ protection: None, Other-see comments     Comment: Nuvaring   Other Topics Concern  . None   Social History Narrative   Entered 06/2014:   Has 2 children--both are boys--one is 61 y/o. The other is "teenager"--not sure exact age.   At home --it is her and her 2 sons.   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   Family History  Problem Relation Age of Onset  . Asthma Mother   . Hypertension Mother   . Miscarriages / [place] Mother     . Alcohol abuse Father   . Drug abuse Father   . Pancreatic cancer Father   . Arthritis Maternal Grandmother   . Colon cancer Neg Hx   . Stomach cancer Neg Hx   . Esophageal cancer Neg Hx   . Rectal cancer Neg Hx   . Liver cancer Neg Hx       Review of Systems: Pertinent positive and negative review of systems were noted in the above HPI section. All other review of systems were otherwise negative.  Physical Exam: General: Well developed, well nourished, no acute distress Head: Normocephalic and atraumatic Eyes:  sclerae anicteric, EOMI Ears: Normal auditory acuity Mouth: No deformity or lesions Neck: Supple, no masses or thyromegaly Lungs: Clear throughout to auscultation Heart: Regular rate and rhythm; no murmurs, rubs or bruits Abdomen: Soft, non tender and non distended. No masses, hepatosplenomegaly or hernias noted. Normal Bowel sounds Rectal: deferred to colonoscopy Musculoskeletal: Symmetrical with no gross deformities  Skin: No lesions on visible extremities Pulses:  Normal pulses noted Extremities: No clubbing, cyanosis, edema or deformities noted Neurological: Alert oriented x 4, grossly nonfocal Cervical Nodes:  No significant cervical adenopathy Inguinal Nodes: No significant inguinal adenopathy Psychological:  Alert and cooperative. Normal mood and affect  Assessment and Recommendations:  1. Acute diverticulitis, recurrent. Resolving with second course of antibiotics. She is advised to complete the ten-day course of Cipro and Flagyl as prescribed. Gradually advance diet while avoiding high fat foods, red meats and high fiber foods for the next 1-2 weeks. Long-term high fiber diet with adequate daily water intake is recommended. Advised to proceed with colonoscopy to rule out less likely possibilities of IBD, neoplasm and other disorders leading to her symptoms and CT findings. The risks (including bleeding, perforation, infection, missed lesions, medication  reactions and possible hospitalization or surgery if complications occur), benefits, and alternatives to colonoscopy with possible biopsy and possible polypectomy were discussed with the patient and they consent to proceed.    cc: Krystal Sheldon, PA-C [facility] [address] [place] [address]

## 2016-07-21 ENCOUNTER — Telehealth: Payer: Self-pay | Admitting: Gastroenterology

## 2016-07-21 NOTE — Telephone Encounter (Signed)
It's possible however she needs to be evaluated by her PCP for this complaint. She can stop Cipro.

## 2016-07-21 NOTE — Telephone Encounter (Signed)
Do you think her hip pain could be due to antibiotics?

## 2016-07-21 NOTE — Telephone Encounter (Signed)
Patient notified. She verbalized understanding to stop Cipro and see her primary care

## 2016-07-30 ENCOUNTER — Ambulatory Visit: Payer: Self-pay | Admitting: Surgery

## 2016-08-01 ENCOUNTER — Telehealth: Payer: Self-pay | Admitting: Gastroenterology

## 2016-08-01 NOTE — Telephone Encounter (Signed)
Left message for patient to call back  

## 2016-08-04 NOTE — Telephone Encounter (Signed)
Left message for patient to call back  

## 2016-08-04 NOTE — Telephone Encounter (Signed)
Patient called and reports that she is fine.  She age whole grain mustard seeds on Friday.  No problems and she thanked me for the call

## 2016-08-20 ENCOUNTER — Telehealth: Payer: Self-pay | Admitting: Obstetrics and Gynecology

## 2016-08-20 ENCOUNTER — Other Ambulatory Visit: Payer: Self-pay | Admitting: Obstetrics and Gynecology

## 2016-08-20 MED ORDER — BUPROPION HCL ER (XL) 150 MG PO TB24: 150.0000 mg | ORAL_TABLET | Freq: Every day | ORAL | 6 refills | Status: DC

## 2016-08-20 NOTE — Telephone Encounter (Signed)
Notified pt. 

## 2016-08-20 NOTE — Telephone Encounter (Signed)
pls advise

## 2016-08-20 NOTE — Telephone Encounter (Signed)
SHE IS FEELING OVERWHELMED AND WOULD LIKE TO GO BACK ON [facility]. [place]

## 2016-08-20 NOTE — Telephone Encounter (Signed)
I sent in prescription today, have her start in the morning

## 2016-08-26 ENCOUNTER — Encounter: Payer: Self-pay | Admitting: Family Medicine

## 2016-08-26 ENCOUNTER — Ambulatory Visit (INDEPENDENT_AMBULATORY_CARE_PROVIDER_SITE_OTHER): Payer: BC Managed Care – PPO | Admitting: Family Medicine

## 2016-08-26 VITALS — BP 130/72 | HR 100 | Temp 99.4°F | Resp 16 | Ht 61.0 in | Wt 151.0 lb

## 2016-08-26 DIAGNOSIS — J069 Acute upper respiratory infection, unspecified: Secondary | ICD-10-CM | POA: Diagnosis not present

## 2016-08-26 MED ORDER — AMOXICILLIN-POT CLAVULANATE 875-125 MG PO TABS: 1.0000 | ORAL_TABLET | Freq: Two times a day (BID) | ORAL | 0 refills | Status: DC

## 2016-08-26 MED ORDER — HYDROCODONE-HOMATROPINE 5-1.5 MG/5ML PO SYRP
5.0000 mL | ORAL_SOLUTION | Freq: Three times a day (TID) | ORAL | 0 refills | Status: DC | PRN
Start: 2016-08-26 — End: 2016-10-30

## 2016-08-26 NOTE — Progress Notes (Signed)
Subjective:    Patient ID: Krystal Blackwell, female    DOB: [birth year], 36 y.o.   MRN: [id number]  HPI Symptoms began Thursday with head congestion and rhinorrhea. It is subsequently spread to the chest with chest congestion, cough productive of yellow and white sputum, low-grade fevers and chills and generalized fatigue. She denies any body aches. She denies any sinus pain although she is prone to develop sinus infections. She does have a mild sore throat. She denies any nausea vomiting or diarrhea Past Medical History:  Diagnosis Date  . Blood transfusion without reported diagnosis    [id number]  . Depression   . Pelvic pain in female   . Recurrent sinus infections   . Scoliosis    Past Surgical History:  Procedure Laterality Date  . NASAL SEPTUM SURGERY  11/2013  . SPINE SURGERY  [phone]   scoliosis   Current Outpatient Prescriptions on File Prior to Visit  Medication Sig Dispense Refill  . Cyanocobalamin (B-12 COMPLIANCE INJECTION IJ) Inject 1 mL as directed every 30 (thirty) days. Reported on 08/17/2015    . etonogestrel-ethinyl estradiol (NUVARING) 0.12-0.015 MG/24HR vaginal ring Insert vaginally and leave in place for 3 consecutive weeks, then remove for 1 week. 1 each 12  . fluticasone (FLONASE) 50 MCG/ACT nasal spray PLACE 2 SPRAYS INTO BOTH NOSTRILS DAILY.  11  . Probiotic Product (SOLUBLE FIBER/PROBIOTICS PO) Take 1 tablet by mouth 1 day or 1 dose.    . Vitamin D, Ergocalciferol, (DRISDOL) 50000 units CAPS capsule Take 1 capsule (50,000 Units total) by mouth once a week. 12 capsule 4   Current Facility-Administered Medications on File Prior to Visit  Medication Dose Route Frequency Provider Last Rate Last Dose  . cyanocobalamin ((VITAMIN B-12)) injection 1,000 mcg  1,000 mcg Intramuscular Once [name], CNM       Allergies  Allergen Reactions  . Avelox [Moxifloxacin Hcl In Nacl]    Social History   Social History  . Marital status: Legally Separated   Spouse name: N/A  . Number of children: 2  . Years of education: N/A   Occupational History  . EC Teacher    Social History Main Topics  . Smoking status: Current Some Day Smoker  . Smokeless tobacco: Never Used     Comment: 2-3 CIGARETTES EVERY TWO WEEKS  . Alcohol use 3.6 oz/week    6 Cans of beer per week     Comment: SOCIALLY  . Drug use: No  . Sexual activity: Yes    Birth control/ protection: None, Other-see comments     Comment: Nuvaring   Other Topics Concern  . Not on file   Social History Narrative   Entered 06/2014:   Has 2 children--both are boys--one is 67 y/o. The other is "teenager"--not sure exact age.   At home --it is her and her 2 sons.   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      Review of Systems  All other systems reviewed and are negative.      Objective:   Physical Exam  Constitutional: She appears well-developed and well-nourished.  HENT:  Right Ear: External ear normal.  Left Ear: External ear normal.  Nose: Nose normal.  Mouth/Throat: Oropharynx is clear and moist. No oropharyngeal exudate.  Eyes: Conjunctivae are normal.  Neck: Neck supple.  Cardiovascular: Normal rate, regular rhythm and normal heart sounds.   No murmur heard. Pulmonary/Chest: Effort normal and breath sounds normal. No respiratory  distress. She has no wheezes. She has no rales.  Abdominal: Soft. Bowel sounds are normal.  Lymphadenopathy:    She has no cervical adenopathy.  Vitals reviewed.         Assessment & Plan:  URI, acute - Plan: amoxicillin-clavulanate (AUGMENTIN) 875-125 MG tablet, HYDROcodone-homatropine (HYCODAN) 5-1.5 MG/5ML syrup  Sons are consistent with viral upper respiratory infection. I recommended Tylenol for fever. I recommended Mucinex for chest congestion and cough. I recommended Sudafed for head congestion and rhinorrhea. I recommended tincture of time. Symptoms should gradually improve over the next 3-4 days.  Because of the patient's proclivity to develop a sinus infection I did give her a prescription for Augmentin. She will not get the antibiotic unless symptoms persist more than 10 days and she developed severe pain in her frontal or maxillary sinuses with persistent fever

## 2016-08-28 ENCOUNTER — Encounter: Payer: Self-pay | Admitting: Gastroenterology

## 2016-08-29 ENCOUNTER — Telehealth: Payer: Self-pay | Admitting: Gastroenterology

## 2016-08-29 NOTE — Telephone Encounter (Signed)
Spoke with pt and let her know she could take miralax for constipation.

## 2016-09-05 ENCOUNTER — Telehealth: Payer: Self-pay | Admitting: Gastroenterology

## 2016-09-05 NOTE — Telephone Encounter (Signed)
No charge this time. 

## 2016-09-08 ENCOUNTER — Encounter: Payer: BC Managed Care – PPO | Admitting: Gastroenterology

## 2016-09-10 ENCOUNTER — Ambulatory Visit (AMBULATORY_SURGERY_CENTER): Payer: BC Managed Care – PPO | Admitting: Gastroenterology

## 2016-09-10 ENCOUNTER — Encounter: Payer: Self-pay | Admitting: Gastroenterology

## 2016-09-10 ENCOUNTER — Telehealth: Payer: Self-pay | Admitting: Gastroenterology

## 2016-09-10 ENCOUNTER — Telehealth: Payer: Self-pay | Admitting: Obstetrics and Gynecology

## 2016-09-10 VITALS — BP 119/64 | HR 68 | Temp 98.9°F | Resp 15 | Ht 61.0 in | Wt 151.0 lb

## 2016-09-10 DIAGNOSIS — R933 Abnormal findings on diagnostic imaging of other parts of digestive tract: Secondary | ICD-10-CM

## 2016-09-10 MED ORDER — SODIUM CHLORIDE 0.9 % IV SOLN: 500.0000 mL | INTRAVENOUS | Status: DC

## 2016-09-10 NOTE — Progress Notes (Signed)
To PACU, vss patent aw report to rn 

## 2016-09-10 NOTE — Telephone Encounter (Signed)
Returned patient's call and she states that she did not start her Prep until 9:30 this a.m. Due to her procedure being rescheduled.  Her Colonoscopy is at 1:30.  I told her she should be fine as long as she does not drink anything after 10:30.  She states that she did not have anything by mouth after 10:30 a.m.  She will come in at 12:30 for a 1:30 procedure.  All questions were answered.

## 2016-09-10 NOTE — Patient Instructions (Signed)
Impression/Recommendations:  Diverticulosis handout given to patient.  Repeat colonoscopy at age 36 for screening purposes.  High fiber diet handout given to patient.  YOU HAD AN ENDOSCOPIC PROCEDURE TODAY AT [facility]:   Refer to the procedure report that was given to you for any specific questions about what was found during the examination.  If the procedure report does not answer your questions, please call your gastroenterologist to clarify.  If you requested that your care partner not be given the details of your procedure findings, then the procedure report has been included in a sealed envelope for you to review at your convenience later.  YOU SHOULD EXPECT: Some feelings of bloating in the abdomen. Passage of more gas than usual.  Walking can help get rid of the air that was put into your GI tract during the procedure and reduce the bloating. If you had a lower endoscopy (such as a colonoscopy or flexible sigmoidoscopy) you may notice spotting of blood in your stool or on the toilet paper. If you underwent a bowel prep for your procedure, you may not have a normal bowel movement for a few days.  Please Note:  You might notice some irritation and congestion in your nose or some drainage.  This is from the oxygen used during your procedure.  There is no need for concern and it should clear up in a day or so.  SYMPTOMS TO REPORT IMMEDIATELY:   Following lower endoscopy (colonoscopy or flexible sigmoidoscopy):  Excessive amounts of blood in the stool  Significant tenderness or worsening of abdominal pains  Swelling of the abdomen that is new, acute  Fever of 100F or higher For urgent or emergent issues, a gastroenterologist can be reached at any hour by calling [phone].   DIET:  We do recommend a small meal at first, but then you may proceed to your regular diet.  Drink plenty of fluids but you should avoid alcoholic beverages for 24 hours.  ACTIVITY:  You  should plan to take it easy for the rest of today and you should NOT DRIVE or use heavy machinery until tomorrow (because of the sedation medicines used during the test).    FOLLOW UP: Our staff will call the number listed on your records the next business day following your procedure to check on you and address any questions or concerns that you may have regarding the information given to you following your procedure. If we do not reach you, we will leave a message.  However, if you are feeling well and you are not experiencing any problems, there is no need to return our call.  We will assume that you have returned to your regular daily activities without incident.  If any biopsies were taken you will be contacted by phone or by letter within the next 1-3 weeks.  Please call us at [phone] if you have not heard about the biopsies in 3 weeks.    SIGNATURES/CONFIDENTIALITY: You and/or your care partner have signed paperwork which will be entered into your electronic medical record.  These signatures attest to the fact that that the information above on your After Visit Summary has been reviewed and is understood.  Full responsibility of the confidentiality of this discharge information lies with you and/or your care-partner.

## 2016-09-10 NOTE — Telephone Encounter (Signed)
Patient needs medication 50 mg extended release changed. She doesn't want extended release because it keeps her up at night. I dont see the medication in her chart?

## 2016-09-10 NOTE — Op Note (Signed)
[facility] Patient Name: Krystal Blackwell Procedure Date: 09/10/2016 1:17 PM MRN: [id number] Endoscopist: Ladene Artist , MD Age: 36 Referring MD:  Date of Birth: August 05, 1981 Gender: Female Account #: [id number] Procedure:                Colonoscopy Indications:              Abnormal CT of the GI tract Medicines:                Monitored Anesthesia Care Procedure:                Pre-Anesthesia Assessment:                           - Prior to the procedure, a History and Physical                            was performed, and patient medications and                            allergies were reviewed. The patient's tolerance of                            previous anesthesia was also reviewed. The risks                            and benefits of the procedure and the sedation                            options and risks were discussed with the patient.                            All questions were answered, and informed consent                            was obtained. Prior Anticoagulants: The patient has                            taken no previous anticoagulant or antiplatelet                            agents. ASA Grade Assessment: II - A patient with                            mild systemic disease. After reviewing the risks                            and benefits, the patient was deemed in                            satisfactory condition to undergo the procedure.                           After obtaining informed consent, the colonoscope  was passed under direct vision. Throughout the                            procedure, the patient's blood pressure, pulse, and                            oxygen saturations were monitored continuously. The                            Model PCF-H190L [id number]) scope was introduced                            through the anus and advanced to the the cecum,                            identified by appendiceal  orifice and ileocecal                            valve. The ileocecal valve, appendiceal orifice,                            and rectum were photographed. The quality of the                            bowel preparation was good. The colonoscopy was                            performed without difficulty. The patient tolerated                            the procedure well. Scope In: 1:55:10 PM Scope Out: 2:04:40 PM Scope Withdrawal Time: 0 hours 6 minutes 25 seconds  Total Procedure Duration: 0 hours 9 minutes 30 seconds  Findings:                 The perianal and digital rectal examinations were                            normal.                           Multiple small-mouthed diverticula were found in                            the sigmoid colon and descending colon. There was                            no evidence of diverticular bleeding.                           The exam was otherwise without abnormality on                            direct and retroflexion views. Complications:            No immediate complications.  Estimated blood loss:                            None. Estimated Blood Loss:     Estimated blood loss: none. Impression:               - Mild diverticulosis in the sigmoid colon and in                            the descending colon.                           - The examination was otherwise normal on direct                            and retroflexion views.                           - No specimens collected. Recommendation:           - Repeat colonoscopy at age 39 for screening                            purposes.                           - Patient has a contact number available for                            emergencies. The signs and symptoms of potential                            delayed complications were discussed with the                            patient. Return to normal activities tomorrow.                            Written discharge instructions were  provided to the                            patient.                           - High fiber diet with adequate water intake.                           - Continue present medications. Ladene Artist, MD 09/10/2016 2:12:27 PM This report has been signed electronically.

## 2016-09-11 ENCOUNTER — Telehealth: Payer: Self-pay

## 2016-09-11 ENCOUNTER — Other Ambulatory Visit: Payer: Self-pay | Admitting: *Deleted

## 2016-09-11 MED ORDER — BUPROPION HCL 75 MG PO TABS: 75.0000 mg | ORAL_TABLET | Freq: Two times a day (BID) | ORAL | 4 refills | Status: DC

## 2016-09-11 NOTE — Telephone Encounter (Signed)
  Follow up Call-  Call back number 09/10/2016  Post procedure Call Back phone  # [phone]  Permission to leave phone message Yes  Some recent data might be hidden     Patient questions:  Do you have a fever, pain , or abdominal swelling? No. Pain Score  0 *  Have you tolerated food without any problems? Yes.    Have you been able to return to your normal activities? Yes.    Do you have any questions about your discharge instructions: Diet   No. Medications  No. Follow up visit  No.  Do you have questions or concerns about your Care? No.  Actions: * If pain score is 4 or above: No action needed, pain <4.

## 2016-09-11 NOTE — Telephone Encounter (Signed)
Pt was taking wellbutrin XR she wanted to do plain not XR states it keeps her awake @ night, sent in wellbutrin 75mg  to pharmacy

## 2016-09-19 ENCOUNTER — Other Ambulatory Visit: Payer: Self-pay | Admitting: Obstetrics and Gynecology

## 2016-10-30 ENCOUNTER — Encounter: Payer: Self-pay | Admitting: Physician Assistant

## 2016-10-30 ENCOUNTER — Ambulatory Visit: Payer: BC Managed Care – PPO | Admitting: Physician Assistant

## 2016-10-30 ENCOUNTER — Ambulatory Visit (INDEPENDENT_AMBULATORY_CARE_PROVIDER_SITE_OTHER): Payer: BC Managed Care – PPO | Admitting: Physician Assistant

## 2016-10-30 VITALS — BP 118/70 | HR 97 | Temp 98.1°F | Resp 18 | Wt 152.2 lb

## 2016-10-30 DIAGNOSIS — J329 Chronic sinusitis, unspecified: Secondary | ICD-10-CM

## 2016-10-30 MED ORDER — PREDNISONE 20 MG PO TABS: ORAL_TABLET | ORAL | 0 refills | Status: DC

## 2016-10-30 MED ORDER — AMOXICILLIN-POT CLAVULANATE 875-125 MG PO TABS: 1.0000 | ORAL_TABLET | Freq: Two times a day (BID) | ORAL | 0 refills | Status: DC

## 2016-10-30 NOTE — Progress Notes (Signed)
Patient ID: Krystal Blackwell MRN: [id number], DOB: March 04, 1981, 36 y.o. Date of Encounter: 10/30/2016, 3:49 PM    Chief Complaint:  Chief Complaint  Patient presents with  . Sinusitis    x5days     HPI: 36 y.o. year old female presents with above.   Has history of a lot of sinus problems. Says that she is even had surgery but it did not help. Secondly still ends up with sinus infections every couple of months.  States that the symptoms started 5 or 6 days ago. It was drainage in her throat and stuffiness in her nose. Now having stuffiness with pressure and congestion throughout all of her sinuses and her whole head feels stopped up even feels congested behind her ears. Getting out some thick dark mucus. No congestion in her chest. No significant sore throat just a little drainage there causing some irritation. No fever or chills.     Home Meds:   Outpatient Medications Prior to Visit  Medication Sig Dispense Refill  . buPROPion (WELLBUTRIN) 75 MG tablet Take 1 tablet (75 mg total) by mouth 2 (two) times daily. 60 tablet 4  . Cyanocobalamin (B-12 COMPLIANCE INJECTION IJ) Inject 1 mL as directed every 30 (thirty) days. Reported on 08/17/2015    . fluticasone (FLONASE) 50 MCG/ACT nasal spray PLACE 2 SPRAYS INTO BOTH NOSTRILS DAILY.  11  . NUVARING 0.12-0.015 MG/24HR vaginal ring INSERT 1 RING INTRAVAGINALLY TO LEAVE IN FOR 3 WEEKS THEN REMOVE FOR 4TH WEEK OF EACH MONTH 1 each 6  . Probiotic Product (SOLUBLE FIBER/PROBIOTICS PO) Take 1 tablet by mouth 1 day or 1 dose.    . Vitamin D, Ergocalciferol, (DRISDOL) 50000 units CAPS capsule TAKE 1 CAPSULE (50,000 UNITS TOTAL) BY MOUTH ONCE A WEEK. 12 capsule 2  . amoxicillin-clavulanate (AUGMENTIN) 875-125 MG tablet Take 1 tablet by mouth 2 (two) times daily. (Patient not taking: Reported on 09/10/2016) 20 tablet 0  . HYDROcodone-homatropine (HYCODAN) 5-1.5 MG/5ML syrup Take 5 mLs by mouth every 8 (eight) hours as needed for cough. (Patient  not taking: Reported on 09/10/2016) 120 mL 0   Facility-Administered Medications Prior to Visit  Medication Dose Route Frequency Provider Last Rate Last Dose  . 0.9 %  sodium chloride infusion  500 mL Intravenous Continuous Ladene Artist, MD      . cyanocobalamin ((VITAMIN B-12)) injection 1,000 mcg  1,000 mcg Intramuscular Once Melody Rockney Ghee, CNM        Allergies:  Allergies  Allergen Reactions  . Avelox [Moxifloxacin Hcl In Nacl]       Review of Systems: See HPI for pertinent ROS. All other ROS negative.    Physical Exam: Blood pressure 118/70, pulse 97, temperature 98.1 F (36.7 C), temperature source Oral, resp. rate 18, weight 152 lb 3.2 oz (69 kg), last menstrual period 10/23/2016, SpO2 99 %., Body mass index is 28.76 kg/m. General: WNWD WF.  Appears in no acute distress. HEENT: Normocephalic, atraumatic, eyes without discharge, sclera non-icteric, nares are without discharge. Bilateral auditory canals clear, TM's are without perforation, pearly grey and translucent with reflective cone of light bilaterally. Oral cavity moist, posterior pharynx without exudate, erythema, peritonsillar abscess. Positive tenderness with percussion to frontal and maxillary sinuses bilaterally.  Neck: Supple. No thyromegaly. No lymphadenopathy. Lungs: Clear bilaterally to auscultation without wheezes, rales, or rhonchi. Breathing is unlabored. Heart: Regular rhythm. No murmurs, rubs, or gallops. Msk:  Strength and tone normal for age. Extremities/Skin: Warm and dry. Neuro: Alert and oriented X  3. Moves all extremities spontaneously. Gait is normal. CNII-XII grossly in tact. Psych:  Responds to questions appropriately with a normal affect.     ASSESSMENT AND PLAN:  36 y.o. year old female with  1. Recurrent rhinosinusitis She is to take the Augmentin and prednisone as directed. Follow-up if symptoms do not resolve upon completion of these. - amoxicillin-clavulanate (AUGMENTIN) 875-125 MG  tablet; Take 1 tablet by mouth 2 (two) times daily.  Dispense: 20 tablet; Refill: 0 - predniSONE (DELTASONE) 20 MG tablet; Take 3 daily for 2 days, then 2 daily for 2 days, then 1 daily for 2 days.  Dispense: 12 tablet; Refill: 0   Signed, [address] [place], [place], [facility] 10/30/2016 3:49 PM

## 2016-11-05 ENCOUNTER — Telehealth: Payer: Self-pay | Admitting: Gastroenterology

## 2016-11-05 NOTE — Telephone Encounter (Signed)
Not easy to tell if this is diverticulitis or Augmentin side effect or another process. Trial of glycopyrrolate 2 mg po bid, #60, 5 refills. If LLQ pain does not respond to glycopyrrolate or it worsens she will need an abd/pelvic CT scan and CBC to further evaluate.

## 2016-11-05 NOTE — Telephone Encounter (Signed)
Patient reports a 3 day history of LLQ abdominal pain with some loose stool.  She reports that she has been on a high fiber diet and a pro-biotic.  She is on augmentin for sinus infection.  She notes that the pain is not constant.  She feels this is the beginning of a diverticulitis flare. She has placed herself on a clear liquid diet She is asking if she needs an alternate antibiotic for diverticulitis.  She says that augmentin did not help with diverticulitis in the past.  She also asks if you think she needs treatment for diverticulitis is there something besides cipro and flagyl as they make her very nauseated.

## 2016-11-06 MED ORDER — GLYCOPYRROLATE 2 MG PO TABS: 2.0000 mg | ORAL_TABLET | Freq: Two times a day (BID) | ORAL | 5 refills | Status: DC

## 2016-11-06 NOTE — Telephone Encounter (Signed)
Patient notified of the results and recommendations.  RX sent.  She understands if this fails to improve her pain and we will set up a CT and lab work.

## 2016-11-20 ENCOUNTER — Encounter: Payer: Self-pay | Admitting: Physician Assistant

## 2016-11-27 ENCOUNTER — Telehealth: Payer: Self-pay | Admitting: Physician Assistant

## 2016-11-27 NOTE — Telephone Encounter (Signed)
Pt was seen on 10/30/2016, got better with the antibiotic. But she has the same thing again and wants to know if we can call in script to pharmacy.

## 2016-11-27 NOTE — Telephone Encounter (Signed)
It has been almost a month patient need to be seen. Spoke with patient she is aware

## 2016-12-09 ENCOUNTER — Encounter: Payer: Self-pay | Admitting: Family Medicine

## 2016-12-09 ENCOUNTER — Ambulatory Visit (INDEPENDENT_AMBULATORY_CARE_PROVIDER_SITE_OTHER): Payer: BC Managed Care – PPO | Admitting: Family Medicine

## 2016-12-09 VITALS — BP 110/64 | HR 100 | Temp 99.1°F | Resp 14 | Ht 61.0 in | Wt 155.0 lb

## 2016-12-09 DIAGNOSIS — E669 Obesity, unspecified: Secondary | ICD-10-CM

## 2016-12-09 DIAGNOSIS — J019 Acute sinusitis, unspecified: Secondary | ICD-10-CM | POA: Diagnosis not present

## 2016-12-09 MED ORDER — HYDROCOD POLST-CPM POLST ER 10-8 MG/5ML PO SUER: 5.0000 mL | Freq: Two times a day (BID) | ORAL | 0 refills | Status: DC | PRN

## 2016-12-09 MED ORDER — DOXYCYCLINE HYCLATE 100 MG PO TABS: 100.0000 mg | ORAL_TABLET | Freq: Two times a day (BID) | ORAL | 0 refills | Status: DC

## 2016-12-09 NOTE — Patient Instructions (Addendum)
[facility] ENT Dr. Benjamine Mola  Try Nasal saline  Doxycycline antibiotic  F/U as needed

## 2016-12-09 NOTE — Progress Notes (Signed)
   Subjective:    Patient ID: Krystal Blackwell, female    DOB: Oct 22, 1980, 36 y.o.   MRN: [id number]  Patient presents for Illness (recurrent sinus infection, productive cough with green colored mucus, sinus pressure, nasal draiange)  Patient seen March 15 with sinusitis symptoms she was given Augmentin and steroids she did not improve therefore ,[facility] given [facility] 10 days, finished yesterday but her symptoms have not improved. She now feels like it is into her chest Into chest nail, coughing for past few days, given tessalon perrles  she also continues to have the same sinus symptoms she does not feel like antibiotics if anything to help clear her up. She's had multiple sinus infections as had sinus surgery about 10 years ago last seen by here in her Synthroid 2 years ago states it to surgery did not help. She has not been back since despite having recurrent issues. She is also not using any nasal saline rinse occasionally uses antihistamine  No fever , not sleeping due to sinus pressure, cough         Review Of Systems:  GEN- denies fatigue, fever, weight loss,weakness, recent illness HEENT- denies eye drainage, change in vision, +nasal discharge, CVS- denies chest pain, palpitations RESP- denies SOB, +cough, wheeze ABD- denies N/V, change in stools, abd pain GU- denies dysuria, hematuria, dribbling, incontinence  MSK- denies joint pain, muscle aches, injury Neuro- denies headache, dizziness, syncope, seizure activity       Objective:    BP 110/64   Pulse 100   Temp 99.1 F (37.3 C) (Oral)   Resp 14   Ht 5\' 1"  (1.549 m)   Wt 155 lb (70.3 kg)   LMP 11/28/2016   SpO2 99%   BMI 29.29 kg/m  GEN- NAD, alert and oriented x3 HEENT- PERRL, EOMI, non injected sclera, pink conjunctiva, MMM, oropharynx mild injection, TM clear bilat no effusion,  + maxillary sinus tenderness, inflammed turbinates,  Nasal drainage  Neck- Supple, shotty submandibular LAD CVS- RRR, no  murmur RESP-CTAB EXT- No edema Pulses- Radial 2+          Assessment & Plan:      Problem List Items Addressed This Visit    None    Visit Diagnoses    Acute rhinosinusitis    -  Primary   Recurrent sinusitis, post nasal drip causing the bronchial irritation and cough, given tussionex, not sleeping, doxycycline given , recommend nasal saline, scheduling with ENT, she may benefit from some type of sinus washing so we can see what bacteria she has, I think she is resistant to amoxicllin She agrees to call ENT   Relevant Medications   chlorpheniramine-HYDROcodone (TUSSIONEX PENNKINETIC ER) 10-8 MG/5ML SUER   doxycycline (VIBRA-TABS) 100 MG tablet      Note: This dictation was prepared with Dragon dictation along with smaller phrase technology. Any transcriptional errors that result from this process are unintentional.

## 2016-12-10 ENCOUNTER — Ambulatory Visit: Payer: BC Managed Care – PPO | Admitting: Family Medicine

## 2016-12-17 ENCOUNTER — Encounter: Payer: Self-pay | Admitting: Physician Assistant

## 2017-01-02 ENCOUNTER — Encounter: Payer: Self-pay | Admitting: Obstetrics and Gynecology

## 2017-01-02 ENCOUNTER — Ambulatory Visit (INDEPENDENT_AMBULATORY_CARE_PROVIDER_SITE_OTHER): Payer: BC Managed Care – PPO | Admitting: Obstetrics and Gynecology

## 2017-01-02 VITALS — BP 123/86 | HR 89 | Ht 61.0 in | Wt 153.2 lb

## 2017-01-02 DIAGNOSIS — J40 Bronchitis, not specified as acute or chronic: Secondary | ICD-10-CM | POA: Diagnosis not present

## 2017-01-02 DIAGNOSIS — F413 Other mixed anxiety disorders: Secondary | ICD-10-CM

## 2017-01-02 MED ORDER — CEFDINIR 300 MG PO CAPS: 300.0000 mg | ORAL_CAPSULE | Freq: Two times a day (BID) | ORAL | 1 refills | Status: DC

## 2017-01-02 MED ORDER — MONTELUKAST SODIUM 10 MG PO TABS: 10.0000 mg | ORAL_TABLET | Freq: Every day | ORAL | 1 refills | Status: DC

## 2017-01-02 MED ORDER — FLUOXETINE HCL 10 MG PO CAPS: 10.0000 mg | ORAL_CAPSULE | Freq: Every day | ORAL | 3 refills | Status: DC

## 2017-01-02 NOTE — Progress Notes (Signed)
Subjective:     Patient ID: Krystal Blackwell, female   DOB: 07/31/1981, 36 y.o.   MRN: [id number]  HPI Been on wellbutrin for 1 year, but still feels all over the place. Doesn't feel like medicine is working,feels very emotional. Outburst. Sleeping fine.  Tried zoloft in past. Desires something different.  Also complains of sinus drainage with pressure, that has now settled in chest. Green phlegm coughed up x2 days. Denies fever. Has been taking zyrtec D, flonase and one round of Zpack with no relief.  Review of Systems Negative except stated above in HPI    Objective:   Physical Exam A&Ox4 Well groomed female  Blood pressure 123/86, pulse 89, height _0  (1.549 m), weight 153 lb 3.2 oz (69.5 kg), last menstrual period 12/05/2016. HRR Lungs clear except slight rhales on expiration, dry cough noted.    Assessment:     Anxiety-medication management Bronchitis-acute    Plan:     changed to prozac, will return in 4-6 weeks for Annual exam and will follow up then, sooner if having any issues. Prescribed cefdiner and singulair, and instructed on use and expected outcome.  Kerryann Allaire Combes, CNM

## 2017-02-25 ENCOUNTER — Other Ambulatory Visit: Payer: Self-pay | Admitting: Obstetrics and Gynecology

## 2017-03-04 ENCOUNTER — Encounter: Payer: BC Managed Care – PPO | Admitting: Obstetrics and Gynecology

## 2017-03-05 ENCOUNTER — Encounter: Payer: Self-pay | Admitting: Physician Assistant

## 2017-03-12 ENCOUNTER — Encounter: Payer: Self-pay | Admitting: Obstetrics and Gynecology

## 2017-03-12 ENCOUNTER — Ambulatory Visit (INDEPENDENT_AMBULATORY_CARE_PROVIDER_SITE_OTHER): Payer: BC Managed Care – PPO | Admitting: Obstetrics and Gynecology

## 2017-03-12 VITALS — BP 147/90 | HR 84 | Ht 61.0 in | Wt 156.8 lb

## 2017-03-12 DIAGNOSIS — F32A Depression, unspecified: Secondary | ICD-10-CM

## 2017-03-12 DIAGNOSIS — F329 Major depressive disorder, single episode, unspecified: Secondary | ICD-10-CM | POA: Diagnosis not present

## 2017-03-12 DIAGNOSIS — B353 Tinea pedis: Secondary | ICD-10-CM | POA: Diagnosis not present

## 2017-03-12 DIAGNOSIS — Z79899 Other long term (current) drug therapy: Secondary | ICD-10-CM | POA: Diagnosis not present

## 2017-03-12 DIAGNOSIS — G4720 Circadian rhythm sleep disorder, unspecified type: Secondary | ICD-10-CM

## 2017-03-12 MED ORDER — QUETIAPINE FUMARATE 25 MG PO TABS: 25.0000 mg | ORAL_TABLET | Freq: Every day | ORAL | 1 refills | Status: DC

## 2017-03-12 MED ORDER — ITRACONAZOLE 100 MG PO CAPS: 100.0000 mg | ORAL_CAPSULE | Freq: Two times a day (BID) | ORAL | 2 refills | Status: DC

## 2017-03-12 NOTE — Patient Instructions (Signed)

## 2017-03-12 NOTE — Progress Notes (Signed)
Subjective:     Patient ID: Krystal Blackwell, female   DOB: 26-Jan-1981, 36 y.o.   MRN: [id number]  HPI Here to review prozac use for depression, started on May 18th and felt better and 'happier' for three weeks, then felt very fatigued and wanting to sleep all the time. stopped the prozac for 8-9 days to see if it would help, and it didn't so she restarted it at bedtime for last 2 weeks. Unsure weither she needs a different medication,. Also reports not sleeping again. Can't fall asleep until 1 or 2 am. And then wakes up every hours. Mental foggyness during the day. Frustrated.  Also reports atheletes foot not responding to spray, desires oral medication.  Review of Systems Negative except stated in HPI    Objective:   Physical Exam A&Ox4 Well groomed female in no distress Blood pressure (!) 147/90, pulse 84, height 5\' 1"  (1.549 m), weight 156 lb 12.8 oz (71.1 kg), last menstrual period 03/12/2017. Flat affect, appropriate thought process. Tinea superficial under toes on both feet.    Assessment:     Medication management Tinea pedis Depression Sleep disturbance fatigue     Plan:     Encouraged to continue prozac but increasing dose to 20mg , continuing to take at bedtime. B12 injection given as it was due. sporanax bid sent in seroquel at bedtime prescribed. RTC in 2 weeks for annual.  >50% of 15 minute visit spent in counseling.  Melody Woodlyn, CNM Melody shambley, CNM

## 2017-03-17 ENCOUNTER — Telehealth: Payer: Self-pay | Admitting: Obstetrics and Gynecology

## 2017-03-17 NOTE — Telephone Encounter (Signed)
Patient lvm to schedule an appointment with Melody as soon as possible. Due to Mel's schedule this week, I am Sending back this telephone encounter, To see what options the patient may have as far as getting on the schedule. Patient did not disclose any other information. Please advise.

## 2017-03-17 NOTE — Telephone Encounter (Signed)
Mel pt called states she feels like a "rat on crack" she is very hyper and also just a lot of foggy thinking, took seroquel and it just knocked her out, pls advise

## 2017-03-18 NOTE — Telephone Encounter (Signed)
See if she is willing to see Sharyn Lull or Deneise Lever. Also would recommend seeing a counselor or psychiatrist.

## 2017-03-23 NOTE — Telephone Encounter (Signed)
Pt coming in 03/24/17

## 2017-03-24 ENCOUNTER — Encounter: Payer: Self-pay | Admitting: Obstetrics and Gynecology

## 2017-03-24 ENCOUNTER — Ambulatory Visit (INDEPENDENT_AMBULATORY_CARE_PROVIDER_SITE_OTHER): Payer: BC Managed Care – PPO | Admitting: Obstetrics and Gynecology

## 2017-03-24 VITALS — BP 107/81 | HR 75 | Ht 61.0 in | Wt 159.7 lb

## 2017-03-24 DIAGNOSIS — G479 Sleep disorder, unspecified: Secondary | ICD-10-CM

## 2017-03-24 DIAGNOSIS — Z79899 Other long term (current) drug therapy: Secondary | ICD-10-CM

## 2017-03-24 MED ORDER — BUPROPION HCL 75 MG PO TABS: 75.0000 mg | ORAL_TABLET | Freq: Two times a day (BID) | ORAL | 4 refills | Status: DC

## 2017-03-24 MED ORDER — TRIAZOLAM 0.25 MG PO TABS: 0.2500 mg | ORAL_TABLET | Freq: Every evening | ORAL | 2 refills | Status: DC | PRN

## 2017-03-24 NOTE — Progress Notes (Signed)
Subjective:     Patient ID: Krystal Blackwell, female   DOB: 1981-01-03, 36 y.o.   MRN: [id number]  HPI Stopped prozac 10 days ago due to it making her anxiety worse and felt over stimulated. Never took seroquel as ex-husband died from overdose from it, and is afraid to take it.  Felt 'ok' since then, but now having depression and wanting to sleep again. Has not been in counseling over the summer with son's nurse psycotherapist.  Feels like she wants to try wellbutrin again (not extended release). Either sleeping too much or can't sleep at all. Triazolam.helped in past and OK to try again.  Review of Systems Negative except stated above in HPI.     Objective:   Physical Exam  A&Ox4 Well groomed female in no distress Blood pressure 107/81, pulse 75, height 5\' 1"  (1.549 m), weight 159 lb 11.2 oz (72.4 kg), last menstrual period 03/12/2017. PE not indicated Affect happy and appropriate.     Assessment:     medicaton management Anxiety Sleep disturbance    Plan:      Start wellbutrin at 75mg  for 2 weeks and then increase to 150mg  Restart triazolam at bedtime. RTC as needed.   Kisa Fujii Kerby, CNM

## 2017-03-27 ENCOUNTER — Telehealth: Payer: Self-pay | Admitting: Obstetrics and Gynecology

## 2017-03-27 NOTE — Telephone Encounter (Signed)
Called pharmacy

## 2017-03-27 NOTE — Telephone Encounter (Signed)
The patients pharmacy called and lvm stating that the medication that was recently prescribed "interacts" with another medication the patient is currently taking. The pharmacy stated that they can not fill the medication until they speak with Krystal Blackwell. The call back number is [phone]. No other information was disclosed. Please advise.

## 2017-05-12 ENCOUNTER — Ambulatory Visit (INDEPENDENT_AMBULATORY_CARE_PROVIDER_SITE_OTHER): Payer: BC Managed Care – PPO | Admitting: Gastroenterology

## 2017-05-12 ENCOUNTER — Encounter: Payer: Self-pay | Admitting: Gastroenterology

## 2017-05-12 ENCOUNTER — Telehealth: Payer: Self-pay

## 2017-05-12 ENCOUNTER — Encounter (INDEPENDENT_AMBULATORY_CARE_PROVIDER_SITE_OTHER): Payer: Self-pay

## 2017-05-12 VITALS — BP 126/78 | HR 92 | Ht 61.25 in | Wt 162.2 lb

## 2017-05-12 DIAGNOSIS — R1032 Left lower quadrant pain: Secondary | ICD-10-CM | POA: Diagnosis not present

## 2017-05-12 DIAGNOSIS — K5732 Diverticulitis of large intestine without perforation or abscess without bleeding: Secondary | ICD-10-CM

## 2017-05-12 MED ORDER — CIPROFLOXACIN HCL 500 MG PO TABS: 500.0000 mg | ORAL_TABLET | Freq: Two times a day (BID) | ORAL | 0 refills | Status: AC

## 2017-05-12 MED ORDER — METRONIDAZOLE 500 MG PO TABS: 500.0000 mg | ORAL_TABLET | Freq: Two times a day (BID) | ORAL | 0 refills | Status: AC

## 2017-05-12 NOTE — Progress Notes (Signed)
05/12/2017 Krystal Blackwell [id number] [birth year]   HISTORY OF PRESENT ILLNESS:  This is a very pleasant 36 year old female who is known to Dr. Fuller Plan. She presents for office today with complaints of left lower quadrant abdominal pain that began yesterday. She has been treated for diverticulitis twice in 2017, which were documented on CT scan. She then underwent colonoscopy in January 2018 which showed multiple small mouth diverticuli in the descending and sigmoid colon. She tells me that yesterday during the day she started to have some discomfort but didn't think much of it, but then last night it seemed to be a little worse and then persisted today. She has been following diverticulosis diet, avoiding nuts and seeds, etc. She says that she is not feel hungry but has been drinking lots of water. Was moving her bowels well recently with probiotic, yogurt, etc.  Had some yellow liquid stools the past couple of days.  Past Medical History:  Diagnosis Date  . Blood transfusion without reported diagnosis    9528,4132  . Depression   . Pelvic pain in female   . Recurrent sinus infections   . Scoliosis    Past Surgical History:  Procedure Laterality Date  . NASAL SEPTUM SURGERY  11/2013  . SPINE SURGERY  97,98,99,00   scoliosis    reports that she has been smoking.  She has never used smokeless tobacco. She reports that she drinks about 3.6 oz of alcohol per week . She reports that she does not use drugs. family history includes Alcohol abuse in her father; Arthritis in her maternal grandmother; Asthma in her mother; Drug abuse in her father; Hypertension in her mother; Miscarriages / Stillbirths in her mother; Pancreatic cancer in her father. Allergies  Allergen Reactions  . Avelox [Moxifloxacin Hcl In Nacl]       Outpatient Encounter Prescriptions as of 05/12/2017  Medication Sig  . buPROPion (WELLBUTRIN) 75 MG tablet Take 1 tablet (75 mg total) by mouth 2 (two) times daily.  .  Cyanocobalamin (B-12 COMPLIANCE INJECTION IJ) Inject 1 mL as directed every 30 (thirty) days. Reported on 08/17/2015  . fluticasone (FLONASE) 50 MCG/ACT nasal spray PLACE 2 SPRAYS INTO BOTH NOSTRILS DAILY.  . montelukast (SINGULAIR) 10 MG tablet TAKE 1 TABLET BY MOUTH EVERYDAY AT BEDTIME  . NUVARING 0.12-0.015 MG/24HR vaginal ring INSERT 1 RING INTRAVAGINALLY TO LEAVE IN FOR 3 WEEKS THEN REMOVE FOR 4TH WEEK OF EACH MONTH  . Probiotic Product (SOLUBLE FIBER/PROBIOTICS PO) Take 1 tablet by mouth 1 day or 1 dose.  . triazolam (HALCION) 0.25 MG tablet Take 1 tablet (0.25 mg total) by mouth at bedtime as needed for sleep.  . Vitamin D, Ergocalciferol, (DRISDOL) 50000 units CAPS capsule TAKE 1 CAPSULE (50,000 UNITS TOTAL) BY MOUTH ONCE A WEEK.  . [DISCONTINUED] itraconazole (SPORANOX) 100 MG capsule Take 1 capsule (100 mg total) by mouth 2 (two) times daily.   No facility-administered encounter medications on file as of 05/12/2017.      REVIEW OF SYSTEMS  : All other systems reviewed and negative except where noted in the History of Present Illness.   PHYSICAL EXAM: BP 126/78 (BP Location: Left Arm, Patient Position: Sitting, Cuff Size: Normal)   Pulse 92   Ht 5' 1.25" (1.556 m) Comment: height measured without shoes  Wt 162 lb 4 oz (73.6 kg)   LMP 04/17/2017   BMI 30.41 kg/m  General: Well developed white female in no acute distress Head: Normocephalic and atraumatic Eyes:  Sclerae  anicteric, conjunctiva pink. Ears: Normal auditory acuity Lungs: Clear throughout to auscultation; no increased WOB. Heart: Regular rate and rhythm; no M/R/G. Abdomen: Soft, non-distended.  BS present.  Mild to moderate LLQ TTP. Musculoskeletal: Symmetrical with no gross deformities  Skin: No lesions on visible extremities Extremities: No edema  Neurological: Alert oriented x 4, grossly non-focal Psychological:  Alert and cooperative. Normal mood and affect  ASSESSMENT AND PLAN: -LLQ abdominal pain,  recurrent diverticulitis:  I suspect that is what she has going on right now.  I'm going to treat her with Cipro and Flagyl for 10 days. Clear liquids then to a soft/low-fat her diet. She will call us back if symptoms persist or worsen despite antibiotic treatment. I did mention to her about surgical referral to discuss elective resection since she is very young and has now been treated 3 times for this, 2 documented on CT scan in the past year. She is interested and this so will make her a referral to [facility] surgery.   CC:  Orlena Sheldon, PA-C

## 2017-05-12 NOTE — Patient Instructions (Addendum)
We have sent the following medications to your pharmacy for you to pick up at your convenience: [place]. , [place]. 1. Cipro 500 mg 2. Metronidazole 500 mg  We are doing a referral to [facility]. You will be called with a diverticulitis.

## 2017-05-12 NOTE — Telephone Encounter (Signed)
Patient with a history of diverticulitis. She is having several days of LLQ pain.  She will come in and see Alonza Bogus, PA today at 2:00

## 2017-05-13 ENCOUNTER — Telehealth: Payer: Self-pay | Admitting: *Deleted

## 2017-05-13 ENCOUNTER — Other Ambulatory Visit: Payer: Self-pay | Admitting: Obstetrics and Gynecology

## 2017-05-13 NOTE — Progress Notes (Signed)
Reviewed and agree with initial management plan.  Santiel Topper T. Loyda Costin, MD FACG 

## 2017-05-13 NOTE — Telephone Encounter (Signed)
Faxed office note, labs,  insurance cards, demographics  To [facility], fax # [phone]. Need referral for diverticulitis.

## 2017-05-22 ENCOUNTER — Telehealth: Payer: Self-pay

## 2017-05-22 NOTE — Telephone Encounter (Signed)
Pt has an appt with Dr. Rosendo Gros at [facility] 06/01/17. Arrival time of 2pm. Pt was made aware by [facility] of the appt.

## 2017-06-01 ENCOUNTER — Emergency Department
Admission: EM | Admit: 2017-06-01 | Discharge: 2017-06-01 | Disposition: A | Discharge status: Home / Self Care | Payer: BC Managed Care – PPO | Attending: Emergency Medicine | Admitting: Emergency Medicine

## 2017-06-01 ENCOUNTER — Telehealth: Payer: Self-pay | Admitting: Gastroenterology

## 2017-06-01 ENCOUNTER — Emergency Department: Payer: BC Managed Care – PPO

## 2017-06-01 ENCOUNTER — Encounter: Payer: Self-pay | Admitting: Emergency Medicine

## 2017-06-01 DIAGNOSIS — F17210 Nicotine dependence, cigarettes, uncomplicated: Secondary | ICD-10-CM | POA: Insufficient documentation

## 2017-06-01 DIAGNOSIS — Z79899 Other long term (current) drug therapy: Secondary | ICD-10-CM | POA: Diagnosis not present

## 2017-06-01 DIAGNOSIS — R1032 Left lower quadrant pain: Secondary | ICD-10-CM | POA: Diagnosis present

## 2017-06-01 HISTORY — DX: Diverticulosis of intestine, part unspecified, without perforation or abscess without bleeding: K57.90

## 2017-06-01 HISTORY — DX: Diverticulitis of intestine, part unspecified, without perforation or abscess without bleeding: K57.92

## 2017-06-01 HISTORY — DX: Unspecified ovarian cyst, unspecified side: N83.209

## 2017-06-01 LAB — LIPASE, BLOOD: Lipase: 19 U/L (ref 11–51)

## 2017-06-01 LAB — CBC
HCT: 38.7 % (ref 35.0–47.0)
Hemoglobin: 13.3 g/dL (ref 12.0–16.0)
MCH: 33.4 pg (ref 26.0–34.0)
MCHC: 34.4 g/dL (ref 32.0–36.0)
MCV: 97.1 fL (ref 80.0–100.0)
Platelets: 409 10*3/uL (ref 150–440)
RBC: 3.98 MIL/uL (ref 3.80–5.20)
RDW: 12.8 % (ref 11.5–14.5)
WBC: 7.6 10*3/uL (ref 3.6–11.0)

## 2017-06-01 LAB — URINALYSIS, COMPLETE (UACMP) WITH MICROSCOPIC
Bacteria, UA: NONE SEEN
Bilirubin Urine: NEGATIVE
Glucose, UA: NEGATIVE mg/dL
Ketones, ur: NEGATIVE mg/dL
Leukocytes, UA: NEGATIVE
Nitrite: NEGATIVE
Protein, ur: NEGATIVE mg/dL
Specific Gravity, Urine: 1.003 — ABNORMAL LOW (ref 1.005–1.030)
pH: 7 (ref 5.0–8.0)

## 2017-06-01 LAB — COMPREHENSIVE METABOLIC PANEL
ALT: 18 U/L (ref 14–54)
AST: 18 U/L (ref 15–41)
Albumin: 4.5 g/dL (ref 3.5–5.0)
Alkaline Phosphatase: 49 U/L (ref 38–126)
Anion gap: 8 (ref 5–15)
BUN: <5 mg/dL — ABNORMAL LOW (ref 6–20)
CO2: 24 mmol/L (ref 22–32)
Calcium: 10.3 mg/dL (ref 8.9–10.3)
Chloride: 105 mmol/L (ref 101–111)
Creatinine, Ser: 0.81 mg/dL (ref 0.44–1.00)
GFR calc Af Amer: >60 mL/min (ref >60)
GFR calc non Af Amer: >60 mL/min (ref >60)
Glucose, Bld: 73 mg/dL (ref 65–99)
Potassium: 3.6 mmol/L (ref 3.5–5.1)
Sodium: 137 mmol/L (ref 135–145)
Total Bilirubin: 0.8 mg/dL (ref 0.3–1.2)
Total Protein: 7.5 g/dL (ref 6.5–8.1)

## 2017-06-01 LAB — POCT PREGNANCY, URINE: Preg Test, Ur: NEGATIVE

## 2017-06-01 MED ORDER — IOPAMIDOL (ISOVUE-300) INJECTION 61%
30.0000 mL | Freq: Once | INTRAVENOUS | Status: AC | PRN
  Administered 2017-06-01: 30 mL via ORAL
  Filled 2017-06-01: qty 30

## 2017-06-01 MED ORDER — SODIUM CHLORIDE 0.9 % IV BOLUS (SEPSIS)
1000.0000 mL | Freq: Once | INTRAVENOUS | Status: AC
  Administered 2017-06-01: 1000 mL via INTRAVENOUS

## 2017-06-01 MED ORDER — IOPAMIDOL (ISOVUE-300) INJECTION 61%
100.0000 mL | Freq: Once | INTRAVENOUS | Status: AC | PRN
  Administered 2017-06-01: 100 mL via INTRAVENOUS
  Filled 2017-06-01: qty 100

## 2017-06-01 NOTE — Telephone Encounter (Signed)
Patient will come in and see Dr. Carlean Purl tomorrow at 3:00

## 2017-06-01 NOTE — ED Notes (Signed)
Patient transported to CT 

## 2017-06-01 NOTE — ED Notes (Signed)
Pt drinking contrast. Will notify when completed and ready for CT.

## 2017-06-01 NOTE — ED Provider Notes (Signed)
[facility] Emergency Department Provider Note   ____________________________________________   First MD Initiated Contact with Patient 06/01/17 1931     (approximate)  I have reviewed the triage vital signs and the nursing notes.   HISTORY  Chief Complaint Abdominal Pain    HPI Krystal Blackwell is a 36 y.o. female with a history of diverticulitis who is presenting to the emergency department with left lower quadrant abdominal pain. She says that she just finished her last course of antibiotics for diverticulitis about 5 or 6 days ago but the pain has been steadily increasing since then. Says it feels like a pressure to left lower quadrant and left groin. She says the pain is also radiating to her back. Also the family history of kidney stones. Says slight nausea and loss of appetite but no vomiting. No blood in her stool. Denies diarrhea. Says the pain is a 4 out of 10 right now and is not wishing to have any pain medication. Says that she also has a family history of kidney stones. No injury reported, or athletic activity.   Past Medical History:  Diagnosis Date  . Blood transfusion without reported diagnosis    [id number]  . Depression   . Diverticulitis   . Diverticulosis   . Ovarian cyst   . Pelvic pain in female   . Recurrent sinus infections   . Scoliosis     Patient Active Problem List   Diagnosis Date Noted  . LLQ abdominal pain 05/12/2017  . Diverticulitis of colon 06/16/2016  . 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 Elevated platelet count 08/21/2015  . Vitamin D deficiency 06/19/2014  . Blood transfusion without reported diagnosis   . Depression with anxiety     Past Surgical History:  Procedure Laterality Date  . NASAL SEPTUM SURGERY  11/2013  . SPINE SURGERY  97,98,99,00   scoliosis    Prior to Admission medications   Medication Sig Start Date End Date Taking? Authorizing Provider  buPROPion (WELLBUTRIN) 75 MG tablet Take 1  tablet (75 mg total) by mouth 2 (two) times daily. 03/24/17   Shambley, Melody N, CNM  Cyanocobalamin (B-12 COMPLIANCE INJECTION IJ) Inject 1 mL as directed every 30 (thirty) days. Reported on 08/17/2015    [provider]  fluticasone (FLONASE) 50 MCG/ACT nasal spray PLACE 2 SPRAYS INTO BOTH NOSTRILS DAILY. 05/05/16   [provider]  montelukast (SINGULAIR) 10 MG tablet TAKE 1 TABLET BY MOUTH EVERYDAY AT BEDTIME 02/25/17   Shambley, Melody N, CNM  NUVARING 0.12-0.015 MG/24HR vaginal ring INSERT 1 RING INTRAVAGINALLY TO LEAVE IN FOR 3 WEEKS THEN REMOVE FOR 4TH WEEK OF EACH MONTH 05/13/17   Shambley, Melody N, CNM  Probiotic Product (SOLUBLE FIBER/PROBIOTICS PO) Take 1 tablet by mouth 1 day or 1 dose.    [provider]  triazolam (HALCION) 0.25 MG tablet Take 1 tablet (0.25 mg total) by mouth at bedtime as needed for sleep. 03/24/17   Shambley, Melody N, CNM  Vitamin D, Ergocalciferol, (DRISDOL) 50000 units CAPS capsule TAKE 1 CAPSULE (50,000 UNITS TOTAL) BY MOUTH ONCE A WEEK. 09/19/16   Shambley, Melody N, CNM    Allergies Avelox [moxifloxacin hcl in nacl]  Family History  Problem Relation Age of Onset  . Asthma Mother   . Hypertension Mother   . Miscarriages / [place] Mother   . Alcohol abuse Father   . Drug abuse Father   . Pancreatic cancer Father   . Arthritis Maternal Grandmother   .  Colon cancer Neg Hx   . Stomach cancer Neg Hx   . Esophageal cancer Neg Hx   . Rectal cancer Neg Hx   . Liver cancer Neg Hx     Social History Social History  Substance Use Topics  . Smoking status: Current Some Day Smoker  . Smokeless tobacco: Never Used     Comment: 2-3 CIGARETTES EVERY TWO WEEKS  . Alcohol use 3.6 oz/week    6 Cans of beer per week     Comment: SOCIALLY    Review of Systems  Constitutional: No fever/chills Eyes: No visual changes. ENT: No sore throat. Cardiovascular: Denies chest pain. Respiratory: Denies shortness of  breath. Gastrointestinal: no vomiting.  No diarrhea.  No constipation. Genitourinary: Negative for dysuria. Musculoskeletal:as above Skin: Negative for rash. Neurological: Negative for headaches, focal weakness or numbness.   ____________________________________________   PHYSICAL EXAM:  VITAL SIGNS: ED Triage Vitals  Enc Vitals Group     BP 06/01/17 1816 130/79     Pulse Rate 06/01/17 1816 87     Resp 06/01/17 1816 16     Temp 06/01/17 1816 98.7 F (37.1 C)     Temp Source 06/01/17 1816 Oral     SpO2 06/01/17 1816 98 %     Weight 06/01/17 1814 162 lb (73.5 kg)     Height 06/01/17 1814 5' 1.25" (1.556 m)     Head Circumference --      Peak Flow --      Pain Score 06/01/17 1814 5     Pain Loc --      Pain Edu? --      Excl. in [place]? --     Constitutional: Alert and oriented. Well appearing and in no acute distress. Eyes: Conjunctivae are normal.  Head: Atraumatic. Nose: No congestion/rhinnorhea. Mouth/Throat: Mucous membranes are moist.  Neck: No stridor.   Cardiovascular: Normal rate, regular rhythm. Grossly normal heart sounds.  Good peripheral circulation. Respiratory: Normal respiratory effort.  No retractions. Lungs CTAB. Gastrointestinal: Soft with mild to moderate tenderness palpation of the left lower quadrant extending downward over the left inguinal ligament without any hernia sac palpated.No distention. very mild left low CVA tenderness to palpation. Musculoskeletal: No lower extremity tenderness nor edema.  No joint effusions. Neurologic:  Normal speech and language. No gross focal neurologic deficits are appreciated. Skin:  Skin is warm, dry and intact. No rash noted. Psychiatric: Mood and affect are normal. Speech and behavior are normal.  ____________________________________________   LABS (all labs ordered are listed, but only abnormal results are displayed)  Labs Reviewed  COMPREHENSIVE METABOLIC PANEL - Abnormal; Notable for the following:        Result Value   BUN <5 (*)    All other components within normal limits  URINALYSIS, COMPLETE (UACMP) WITH MICROSCOPIC - Abnormal; Notable for the following:    Color, Urine STRAW (*)    APPearance CLEAR (*)    Specific Gravity, Urine 1.003 (*)    Hgb urine dipstick MODERATE (*)    Squamous Epithelial / LPF 0-5 (*)    All other components within normal limits  LIPASE, BLOOD  CBC  POCT PREGNANCY, URINE   ____________________________________________  EKG   ____________________________________________  RADIOLOGY  minimal sigmoid colon diverticular disease without any acute inflammation. No acute abnormalities the abdomen and pelvis. ____________________________________________   PROCEDURES  Procedure(s) performed:   Procedures  Critical Care performed:   ____________________________________________   INITIAL IMPRESSION / ASSESSMENT AND PLAN / ED COURSE  Pertinent labs & imaging results that were available during my care of the patient were reviewed by me and considered in my medical decision making (see chart for details).  Differential diagnosis includes, but is not limited to, ovarian cyst, ovarian torsion, acute appendicitis, diverticulitis, urinary tract infection/pyelonephritis, endometriosis, bowel obstruction, colitis, renal colic, gastroenteritis, hernia, pregnancy related pain including ectopic pregnancy, etc.  As part of my medical decision making, I reviewed the following data within the [place] chart reviewed      ----------------------------------------- 9:35 PM on 06/01/2017 -----------------------------------------  Patient at this time without any distress. Very reassuring workup including a CAT scan of the abdomen and pelvis without any acute disease. Plan is for the patient to be discharged home. She'll be following up with her primary care doctor. Possible groin  strain.  ____________________________________________   FINAL CLINICAL IMPRESSION(S) / ED DIAGNOSES  abdominal pain.    NEW MEDICATIONS STARTED DURING THIS VISIT:  New Prescriptions   No medications on file     Note:  This document was prepared using Dragon voice recognition software and may include unintentional dictation errors.     Orbie Pyo, MD 06/01/17 2137

## 2017-06-01 NOTE — ED Triage Notes (Signed)
C/O left lower abdominal pain since Friday. Has history of diverticulitis.  Recently finished coarse of Flagyl and Cipro from September 25th-- finished on 10/5

## 2017-06-02 ENCOUNTER — Ambulatory Visit: Payer: BC Managed Care – PPO | Admitting: Gastroenterology

## 2017-06-02 ENCOUNTER — Ambulatory Visit: Payer: BC Managed Care – PPO | Admitting: Internal Medicine

## 2017-07-04 ENCOUNTER — Emergency Department: Payer: BC Managed Care – PPO

## 2017-07-04 ENCOUNTER — Emergency Department
Admission: EM | Admit: 2017-07-04 | Discharge: 2017-07-04 | Disposition: A | Discharge status: Home / Self Care | Payer: BC Managed Care – PPO | Attending: Emergency Medicine | Admitting: Emergency Medicine

## 2017-07-04 DIAGNOSIS — S0083XA Contusion of other part of head, initial encounter: Secondary | ICD-10-CM | POA: Diagnosis not present

## 2017-07-04 DIAGNOSIS — Y999 Unspecified external cause status: Secondary | ICD-10-CM | POA: Insufficient documentation

## 2017-07-04 DIAGNOSIS — Z79899 Other long term (current) drug therapy: Secondary | ICD-10-CM | POA: Insufficient documentation

## 2017-07-04 DIAGNOSIS — F17210 Nicotine dependence, cigarettes, uncomplicated: Secondary | ICD-10-CM | POA: Diagnosis not present

## 2017-07-04 DIAGNOSIS — Y929 Unspecified place or not applicable: Secondary | ICD-10-CM | POA: Diagnosis not present

## 2017-07-04 DIAGNOSIS — S01512A Laceration without foreign body of oral cavity, initial encounter: Secondary | ICD-10-CM | POA: Diagnosis not present

## 2017-07-04 DIAGNOSIS — Y939 Activity, unspecified: Secondary | ICD-10-CM | POA: Insufficient documentation

## 2017-07-04 DIAGNOSIS — S0993XA Unspecified injury of face, initial encounter: Secondary | ICD-10-CM | POA: Diagnosis present

## 2017-07-04 MED ORDER — IBUPROFEN 800 MG PO TABS
ORAL_TABLET | ORAL | Status: AC
  Filled 2017-07-04: qty 1

## 2017-07-04 MED ORDER — AMOXICILLIN-POT CLAVULANATE 875-125 MG PO TABS: 1.0000 | ORAL_TABLET | Freq: Two times a day (BID) | ORAL | 0 refills | Status: AC

## 2017-07-04 MED ORDER — AMOXICILLIN-POT CLAVULANATE 875-125 MG PO TABS
ORAL_TABLET | ORAL | Status: AC
  Filled 2017-07-04: qty 1

## 2017-07-04 MED ORDER — IBUPROFEN 800 MG PO TABS
800.0000 mg | ORAL_TABLET | Freq: Once | ORAL | Status: AC
  Administered 2017-07-04: 800 mg via ORAL

## 2017-07-04 MED ORDER — IBUPROFEN 800 MG PO TABS: 800.0000 mg | ORAL_TABLET | Freq: Three times a day (TID) | ORAL | 0 refills | Status: DC | PRN

## 2017-07-04 MED ORDER — AMOXICILLIN-POT CLAVULANATE 875-125 MG PO TABS
1.0000 | ORAL_TABLET | Freq: Once | ORAL | Status: AC
Start: 2017-07-04 — End: 2017-07-04
  Administered 2017-07-04: 1 via ORAL
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
## 2017-07-04 NOTE — ED Notes (Signed)
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
## 2017-07-04 NOTE — ED Notes (Signed)
Patient denies LOC

## 2017-07-22 ENCOUNTER — Telehealth: Payer: Self-pay | Admitting: Obstetrics and Gynecology

## 2017-07-22 NOTE — Telephone Encounter (Signed)
Pt works in [place] so it is hard to come here for urine lab. Pt symptoms frequency, last drop burns is irritated x 3 days. Pt uses [facility] [address]. Please advise.

## 2017-07-31 ENCOUNTER — Other Ambulatory Visit (INDEPENDENT_AMBULATORY_CARE_PROVIDER_SITE_OTHER): Payer: BC Managed Care – PPO

## 2017-07-31 ENCOUNTER — Telehealth: Payer: Self-pay | Admitting: Obstetrics and Gynecology

## 2017-07-31 DIAGNOSIS — R300 Dysuria: Secondary | ICD-10-CM | POA: Diagnosis not present

## 2017-07-31 LAB — POCT URINALYSIS DIPSTICK
Bilirubin, UA: NEGATIVE
Glucose, UA: NEGATIVE
Ketones, UA: NEGATIVE
Nitrite, UA: POSITIVE
Spec Grav, UA: 1.01 (ref 1.010–1.025)
Urobilinogen, UA: 0.2 E.U./dL
pH, UA: 6 (ref 5.0–8.0)

## 2017-07-31 MED ORDER — NITROFURANTOIN MONOHYD MACRO 100 MG PO CAPS: 100.0000 mg | ORAL_CAPSULE | Freq: Two times a day (BID) | ORAL | 0 refills | Status: DC

## 2017-07-31 NOTE — Telephone Encounter (Signed)
rx sent in 

## 2017-07-31 NOTE — Telephone Encounter (Signed)
The patient called and stated that she believes that she may have a possible UTI and the patient would like for a nurse to call her, Her symptoms are frequent urination and burning.

## 2017-08-02 LAB — URINE CULTURE

## 2017-08-06 ENCOUNTER — Telehealth: Payer: Self-pay | Admitting: Obstetrics and Gynecology

## 2017-08-06 ENCOUNTER — Other Ambulatory Visit: Payer: Self-pay | Admitting: *Deleted

## 2017-08-06 MED ORDER — PHENAZOPYRIDINE HCL 200 MG PO TABS: 200.0000 mg | ORAL_TABLET | Freq: Three times a day (TID) | ORAL | 2 refills | Status: DC | PRN

## 2017-08-06 NOTE — Telephone Encounter (Signed)
Sent in some pyridium

## 2017-08-06 NOTE — Telephone Encounter (Signed)
The patient called and stated that she was seen recently for UTI symptoms, And received a prescription, The first few days the medication worked and now her symptoms are back (burning, Frequent urination, and discomfort) The patient would like a call back from a nurse and also a stronger medication. No other information was disclosed. Please advise.

## 2017-08-07 ENCOUNTER — Telehealth: Payer: Self-pay | Admitting: Obstetrics and Gynecology

## 2017-08-07 NOTE — Telephone Encounter (Signed)
Patient called stating she is still in pain and still having symptoms. She would like a call back.

## 2017-08-18 NOTE — L&D Delivery Note (Signed)
Delivery Note At  0026 am a viable and healthy female "Lilia Pro" was delivered via  (Presentation:LOA ;  ).  APGAR:8 ,9  .   Placenta status: delivered intact with 3 vessel Cord:  with the following complications: none  Anesthesia:  epidural Episiotomy:  none Lacerations:  none Suture Repair: NA Est. Blood Loss (mL):  100  Mom to postpartum.  Baby to Couplet care / Skin to Skin.  Jerid Catherman N Eisen Robenson 05/29/2018, 12:46 AM

## 2017-09-07 ENCOUNTER — Other Ambulatory Visit: Payer: Self-pay | Admitting: Obstetrics and Gynecology

## 2017-09-23 ENCOUNTER — Other Ambulatory Visit: Payer: Self-pay | Admitting: Obstetrics and Gynecology

## 2017-10-26 ENCOUNTER — Other Ambulatory Visit: Payer: Self-pay | Admitting: Obstetrics and Gynecology

## 2017-10-26 ENCOUNTER — Ambulatory Visit (INDEPENDENT_AMBULATORY_CARE_PROVIDER_SITE_OTHER): Payer: BC Managed Care – PPO | Admitting: Certified Nurse Midwife

## 2017-10-26 VITALS — BP 120/79 | HR 92 | Wt 164.1 lb

## 2017-10-26 DIAGNOSIS — R5383 Other fatigue: Secondary | ICD-10-CM

## 2017-10-26 MED ORDER — CYANOCOBALAMIN 1000 MCG/ML IJ SOLN
1000.0000 ug | Freq: Once | INTRAMUSCULAR | Status: AC
  Administered 2017-10-26: 1000 ug via INTRAMUSCULAR

## 2017-10-26 NOTE — Progress Notes (Signed)
I have reviewed the record and concur with patient management and plan.    Jaideep Pollack Michelle Mashal Slavick, CNM [facility] 

## 2017-10-26 NOTE — Progress Notes (Signed)
Pt presented for a Vit B12 injection. States she is not using phentermine just gets injection.

## 2017-11-12 ENCOUNTER — Encounter: Payer: Self-pay | Admitting: Obstetrics and Gynecology

## 2017-11-12 ENCOUNTER — Other Ambulatory Visit (INDEPENDENT_AMBULATORY_CARE_PROVIDER_SITE_OTHER): Payer: BC Managed Care – PPO

## 2017-11-12 ENCOUNTER — Ambulatory Visit (INDEPENDENT_AMBULATORY_CARE_PROVIDER_SITE_OTHER): Payer: BC Managed Care – PPO | Admitting: Obstetrics and Gynecology

## 2017-11-12 VITALS — BP 91/61 | HR 108 | Ht 61.0 in | Wt 163.1 lb

## 2017-11-12 DIAGNOSIS — N926 Irregular menstruation, unspecified: Secondary | ICD-10-CM

## 2017-11-12 LAB — POCT URINE PREGNANCY: Preg Test, Ur: POSITIVE — AB

## 2017-11-12 NOTE — Progress Notes (Signed)
Subjective:     Patient ID: Krystal Blackwell, female   DOB: Dec 29, 1980, 37 y.o.   MRN: [id number]  HPI Here for pregnancy confirmation. Reports mild constipation and fatigue.  Review of Systems Negative except stated above in above     Objective:   Physical Exam A&Ox4 Blood pressure 91/61, pulse (!) 108, height 5\' 1"  (1.549 m), weight 163 lb 1.6 oz (74 kg), last menstrual period 08/26/2017. PE not indicated but pelvic u/s reveals:  Findings:  Singleton intrauterine pregnancy is visualized with a CRL consistent with 11 1/[redacted] weeks gestation, giving an (U/S) EDD of 06/02/18. A clinical EDD has not been established due to unknown LMP.  FHR: 160 BPM CRL measurement: 43.1 mm Yolk sac was not visualized. Early anatomy appears WNL.  Right Ovary measures 3.3 x 2.4 x 1.7 cm. It is normal in appearance. Left Ovary was not visualized. There is no obvious evidence of a corpus luteal cyst. Survey of the adnexa demonstrates no adnexal masses. There is no free peritoneal fluid in the cul de sac.  Impression: 1. 11 1/7 week Viable Singleton Intrauterine pregnancy by U/S. 2. A clinical EDD has not been established due to unknown LMP, so today's ultrasound should be used.  Recommendations: 1.Clinical correlation with the patient's History and Physical Exam. 2. Today's ultrasound should be used to establish EDD of 06/02/18.    Assessment:     Missed menses with confirmed fetus at [place]:     New OB labs and genetic screening obtained, information about pregnancy given, RTC in 4 weeks for next OB visit.  Melody Shambley,CNM

## 2017-11-12 NOTE — Patient Instructions (Addendum)
Thank you for enrolling in [facility]. Please follow the instructions below to securely access your online medical record. MyChart allows you to send messages to your doctor, view your test results, renew your prescriptions, schedule appointments, and more.  How Do I Sign Up? 1. In your Internet browser, go to [email or web address]. 2. Click on the New  User? link in the Sign In box.  3. Enter your MyChart Access Code exactly as it appears below. You will not need to use this code after you have completed the sign-up process. If you do not sign up before the expiration date, you must request a new code. MyChart Access Code: [email or web address] Expires: 12/27/2017  4:42 PM  4. Enter the last four digits of your Social Security Number (xxxx) and Date of Birth (mm/dd/yyyy) as indicated and click Next. You will be taken to the next sign-up page. 5. Create a MyChart ID. This will be your MyChart login ID and cannot be changed, so think of one that is secure and easy to remember. 6. Create a MyChart password. You can change your password at any time. 7. Enter your Password Reset Question and Answer and click Next. This can be used at a later time if you forget your password.  8. Select your communication preference, and if applicable enter your e-mail address. You will receive e-mail notification when new information is available in MyChart by choosing to receive e-mail notifications and filling in your e-mail. 9. Click Sign In. You can now view your medical record.   Additional Information If you have questions, you can email [email or web address] or call [phone] to talk to our [place] staff. Remember, MyChart is NOT to be used for urgent needs. For medical emergencies, dial 911.   Second Trimester of Pregnancy The second trimester is from week 13 through week 28, month 4 through 6. This is often the time in pregnancy that you feel your best. Often times, morning sickness  has lessened or quit. You may have more energy, and you may get hungry more often. Your unborn baby (fetus) is growing rapidly. At the end of the sixth month, he or she is about 9 inches long and weighs about 1 pounds. You will likely feel the baby move (quickening) between 18 and 20 weeks of pregnancy. Follow these instructions at home:  Avoid all smoking, herbs, and alcohol. Avoid drugs not approved by your doctor.  Do not use any tobacco products, including cigarettes, chewing tobacco, and electronic cigarettes. If you need help quitting, ask your doctor. You may get counseling or other support to help you quit.  Only take medicine as told by your doctor. Some medicines are safe and some are not during pregnancy.  Exercise only as told by your doctor. Stop exercising if you start having cramps.  Eat regular, healthy meals.  Wear a good support bra if your breasts are tender.  Do not use hot tubs, steam rooms, or saunas.  Wear your seat belt when driving.  Avoid raw meat, uncooked cheese, and liter boxes and soil used by cats.  Take your prenatal vitamins.  Take 1500-2000 milligrams of calcium daily starting at the 20th week of pregnancy until you deliver your baby.  Try taking medicine that helps you poop (stool softener) as needed, and if your doctor approves. Eat more fiber by eating fresh fruit, vegetables, and whole grains. Drink enough fluids to keep your pee (urine) clear or pale yellow.  Take warm water baths (sitz  baths) to soothe pain or discomfort caused by hemorrhoids. Use hemorrhoid cream if your doctor approves.  If you have puffy, bulging veins (varicose veins), wear support hose. Raise (elevate) your feet for 15 minutes, 3-4 times a day. Limit salt in your diet.  Avoid heavy lifting, wear low heals, and sit up straight.  Rest with your legs raised if you have leg cramps or low back pain.  Visit your dentist if you have not gone during your pregnancy. Use a soft  toothbrush to brush your teeth. Be gentle when you floss.  You can have sex (intercourse) unless your doctor tells you not to.  Go to your doctor visits. Get help if:  You feel dizzy.  You have mild cramps or pressure in your lower belly (abdomen).  You have a nagging pain in your belly area.  You continue to feel sick to your stomach (nauseous), throw up (vomit), or have watery poop (diarrhea).  You have bad smelling fluid coming from your vagina.  You have pain with peeing (urination). Get help right away if:  You have a fever.  You are leaking fluid from your vagina.  You have spotting or bleeding from your vagina.  You have severe belly cramping or pain.  You lose or gain weight rapidly.  You have trouble catching your breath and have chest pain.  You notice sudden or extreme puffiness (swelling) of your face, hands, ankles, feet, or legs.  You have not felt the baby move in over an hour.  You have severe headaches that do not go away with medicine.  You have vision changes. This information is not intended to replace advice given to you by your health care provider. Make sure you discuss any questions you have with your health care provider. Document Released: 10/29/2009 Document Revised: 01/10/2016 Document Reviewed: 10/05/2012 Elsevier Interactive Patient Education  2017 [employer].

## 2017-11-13 LAB — AB SCR+ANTIBODY ID: Antibody Screen: POSITIVE — AB

## 2017-11-13 LAB — MICROSCOPIC EXAMINATION: Casts: NONE SEEN /lpf

## 2017-11-13 LAB — MONITOR DRUG PROFILE 14(MW)
Amphetamine Scrn, Ur: NEGATIVE ng/mL
BARBITURATE SCREEN URINE: NEGATIVE ng/mL
BENZODIAZEPINE SCREEN, URINE: NEGATIVE ng/mL
Buprenorphine, Urine: NEGATIVE ng/mL
CANNABINOIDS UR QL SCN: NEGATIVE ng/mL
Cocaine (Metab) Scrn, Ur: NEGATIVE ng/mL
Creatinine(Crt), U: 25.2 mg/dL (ref 20.0–300.0)
Fentanyl, Urine: NEGATIVE pg/mL
Meperidine Screen, Urine: NEGATIVE ng/mL
Methadone Screen, Urine: NEGATIVE ng/mL
OXYCODONE+OXYMORPHONE UR QL SCN: NEGATIVE ng/mL
Opiate Scrn, Ur: NEGATIVE ng/mL
Ph of Urine: 7.4 (ref 4.5–8.9)
Phencyclidine Qn, Ur: NEGATIVE ng/mL
Propoxyphene Scrn, Ur: NEGATIVE ng/mL
SPECIFIC GRAVITY: 1.004
Tramadol Screen, Urine: NEGATIVE ng/mL

## 2017-11-13 LAB — CBC WITH DIFFERENTIAL/PLATELET
Basophils Absolute: 0 10*3/uL (ref 0.0–0.2)
Basos: 0 %
EOS (ABSOLUTE): 0.2 10*3/uL (ref 0.0–0.4)
Eos: 2 %
Hematocrit: 35.7 % (ref 34.0–46.6)
Hemoglobin: 11.9 g/dL (ref 11.1–15.9)
Immature Grans (Abs): 0 10*3/uL (ref 0.0–0.1)
Immature Granulocytes: 0 %
Lymphocytes Absolute: 2.3 10*3/uL (ref 0.7–3.1)
Lymphs: 23 %
MCH: 32.3 pg (ref 26.6–33.0)
MCHC: 33.3 g/dL (ref 31.5–35.7)
MCV: 97 fL (ref 79–97)
Monocytes Absolute: 0.6 10*3/uL (ref 0.1–0.9)
Monocytes: 6 %
Neutrophils Absolute: 6.9 10*3/uL (ref 1.4–7.0)
Neutrophils: 69 %
Platelets: 400 10*3/uL — ABNORMAL HIGH (ref 150–379)
RBC: 3.68 x10E6/uL — ABNORMAL LOW (ref 3.77–5.28)
RDW: 13.7 % (ref 12.3–15.4)
WBC: 10 10*3/uL (ref 3.4–10.8)

## 2017-11-13 LAB — RPR: RPR Ser Ql: NONREACTIVE

## 2017-11-13 LAB — HEPATITIS B SURFACE ANTIGEN: Hepatitis B Surface Ag: NEGATIVE

## 2017-11-13 LAB — HEMOGLOBIN A1C
Est. average glucose Bld gHb Est-mCnc: 94 mg/dL
Hgb A1c MFr Bld: 4.9 % (ref 4.8–5.6)

## 2017-11-13 LAB — ANTIBODY SCREEN

## 2017-11-13 LAB — URINALYSIS, ROUTINE W REFLEX MICROSCOPIC
Bilirubin, UA: NEGATIVE
Glucose, UA: NEGATIVE
Ketones, UA: NEGATIVE
Nitrite, UA: NEGATIVE
Protein, UA: NEGATIVE
RBC, UA: NEGATIVE
Specific Gravity, UA: 1.009 (ref 1.005–1.030)
Urobilinogen, Ur: 0.2 mg/dL (ref 0.2–1.0)
pH, UA: 7.5 (ref 5.0–7.5)

## 2017-11-13 LAB — GC/CHLAMYDIA PROBE AMP
Chlamydia trachomatis, NAA: NEGATIVE
Neisseria gonorrhoeae by PCR: NEGATIVE

## 2017-11-13 LAB — TSH: TSH: 1.56 u[IU]/mL (ref 0.450–4.500)

## 2017-11-13 LAB — HIV ANTIBODY (ROUTINE TESTING W REFLEX): HIV Screen 4th Generation wRfx: NONREACTIVE

## 2017-11-13 LAB — VARICELLA ZOSTER ANTIBODY, IGG: Varicella zoster IgG: 803 index (ref >165)

## 2017-11-13 LAB — ABO AND RH: Rh Factor: POSITIVE

## 2017-11-14 LAB — URINE CULTURE

## 2017-11-23 ENCOUNTER — Telehealth: Payer: Self-pay | Admitting: *Deleted

## 2017-11-23 NOTE — Telephone Encounter (Signed)
Patient called and states that she missed a call from the nurse. Patient is requesting a call back. Her contact # is [phone]. Please advise. Thank you

## 2017-12-10 ENCOUNTER — Encounter: Payer: Self-pay | Admitting: Obstetrics and Gynecology

## 2017-12-10 ENCOUNTER — Ambulatory Visit (INDEPENDENT_AMBULATORY_CARE_PROVIDER_SITE_OTHER): Payer: BC Managed Care – PPO | Admitting: Obstetrics and Gynecology

## 2017-12-10 VITALS — BP 113/74 | HR 83 | Wt 163.5 lb

## 2017-12-10 DIAGNOSIS — Z3492 Encounter for supervision of normal pregnancy, unspecified, second trimester: Secondary | ICD-10-CM

## 2017-12-10 LAB — POCT URINALYSIS DIPSTICK
Bilirubin, UA: NEGATIVE
Blood, UA: NEGATIVE
Glucose, UA: NEGATIVE
Ketones, UA: NEGATIVE
Nitrite, UA: NEGATIVE
Protein, UA: NEGATIVE
Spec Grav, UA: 1.01 (ref 1.010–1.025)
Urobilinogen, UA: 0.2 E.U./dL
pH, UA: 6 (ref 5.0–8.0)

## 2017-12-10 NOTE — Progress Notes (Signed)
ROB-doing well. Reviewed labs. Anatomy scan next visit.

## 2017-12-10 NOTE — Progress Notes (Signed)
ROB- pt is doing well 

## 2017-12-22 ENCOUNTER — Telehealth: Payer: Self-pay | Admitting: Obstetrics and Gynecology

## 2017-12-22 NOTE — Telephone Encounter (Signed)
The patient called and stated that she missed a call from [name] and she would like a call back. Please advise.
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
## 2017-12-23 NOTE — Telephone Encounter (Signed)
Spoke with pt

## 2018-01-05 ENCOUNTER — Telehealth: Payer: Self-pay | Admitting: *Deleted

## 2018-01-05 NOTE — Telephone Encounter (Signed)
Left pt detailed message to add some protein to her diet, if that doesn't work call me back and I will make appt

## 2018-01-05 NOTE — Telephone Encounter (Signed)
Patient called and states that she is not feeling well. 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 She states she is very dizzy. She is unsure if she needs an appt or wait to see if things past. Patient is requesting a call back. Her contact # is [phone]. Please advise. Thank you

## 2018-01-06 ENCOUNTER — Ambulatory Visit (INDEPENDENT_AMBULATORY_CARE_PROVIDER_SITE_OTHER): Payer: BC Managed Care – PPO | Admitting: Obstetrics and Gynecology

## 2018-01-06 VITALS — BP 132/75 | HR 98 | Wt 165.1 lb

## 2018-01-06 DIAGNOSIS — Z3492 Encounter for supervision of normal pregnancy, unspecified, second trimester: Secondary | ICD-10-CM

## 2018-01-06 DIAGNOSIS — N760 Acute vaginitis: Secondary | ICD-10-CM | POA: Diagnosis not present

## 2018-01-06 DIAGNOSIS — B9689 Other specified bacterial agents as the cause of diseases classified elsewhere: Secondary | ICD-10-CM

## 2018-01-06 LAB — POCT URINALYSIS DIPSTICK
Bilirubin, UA: NEGATIVE
Glucose, UA: NEGATIVE
Ketones, UA: NEGATIVE
Leukocytes, UA: NEGATIVE
Nitrite, UA: NEGATIVE
Protein, UA: NEGATIVE
Spec Grav, UA: 1.01 (ref 1.010–1.025)
Urobilinogen, UA: 0.2 E.U./dL
pH, UA: 6.5 (ref 5.0–8.0)

## 2018-01-06 MED ORDER — METRONIDAZOLE 500 MG PO TABS: 500.0000 mg | ORAL_TABLET | Freq: Two times a day (BID) | ORAL | 0 refills | Status: DC

## 2018-01-06 NOTE — Progress Notes (Signed)
OB WORK IN- c/o increased HR yesterday, dizzy spells, x 1 day, having lower abd pain L side, did have some spotting

## 2018-01-06 NOTE — Progress Notes (Signed)
Problem OB visit: states feeling 'fuzzy' at work with increased heart rate, then had second episode increased heart rate at dinner (all occurred yesterday). Also reports vaginal spotting with LLQpains. No change in FM. Reassured and discussed HR changes in pregnancy.  Pelvic exam: normal external genitalia, vulva, vagina, cervix, uterus and adnexa, CERVIX: cervical discharge present - copious, creamy, green and malodorous, WET MOUNT done - results: KOH done, clue cells, excessive bacteria, white blood cells. Treated with Flagyl 500mg  bid x 5 days.

## 2018-01-06 NOTE — Patient Instructions (Signed)
Bacterial Vaginosis Bacterial vaginosis is a vaginal infection that occurs when the normal balance of bacteria in the vagina is disrupted. It results from an overgrowth of certain bacteria. This is the most common vaginal infection among women ages 15-44. Because bacterial vaginosis increases your risk for STIs (sexually transmitted infections), getting treated can help reduce your risk for chlamydia, gonorrhea, herpes, and HIV (human immunodeficiency virus). Treatment is also important for preventing complications in pregnant women, because this condition can cause an early (premature) delivery. What are the causes? This condition is caused by an increase in harmful bacteria that are normally present in small amounts in the vagina. However, the reason that the condition develops is not fully understood. What increases the risk? The following factors may make you more likely to develop this condition:  Having a new sexual partner or multiple sexual partners.  Having unprotected sex.  Douching.  Having an intrauterine device (IUD).  Smoking.  Drug and alcohol abuse.  Taking certain antibiotic medicines.  Being pregnant.  You cannot get bacterial vaginosis from toilet seats, bedding, swimming pools, or contact with objects around you. What are the signs or symptoms? Symptoms of this condition include:  Grey or white vaginal discharge. The discharge can also be watery or foamy.  A fish-like odor with discharge, especially after sexual intercourse or during menstruation.  Itching in and around the vagina.  Burning or pain with urination.  Some women with bacterial vaginosis have no signs or symptoms. How is this diagnosed? This condition is diagnosed based on:  Your medical history.  A physical exam of the vagina.  Testing a sample of vaginal fluid under a microscope to look for a large amount of bad bacteria or abnormal cells. Your health care provider may use a cotton swab  or a small wooden spatula to collect the sample.  How is this treated? This condition is treated with antibiotics. These may be given as a pill, a vaginal cream, or a medicine that is put into the vagina (suppository). If the condition comes back after treatment, a second round of antibiotics may be needed. Follow these instructions at home: Medicines  Take over-the-counter and prescription medicines only as told by your health care provider.  Take or use your antibiotic as told by your health care provider. Do not stop taking or using the antibiotic even if you start to feel better. General instructions  If you have a female sexual partner, tell her that you have a vaginal infection. She should see her health care provider and be treated if she has symptoms. If you have a female sexual partner, he does not need treatment.  During treatment: ? Avoid sexual activity until you finish treatment. ? Do not douche. ? Avoid alcohol as directed by your health care provider. ? Avoid breastfeeding as directed by your health care provider.  Drink enough water and fluids to keep your urine clear or pale yellow.  Keep the area around your vagina and rectum clean. ? Wash the area daily with warm water. ? Wipe yourself from front to back after using the toilet.  Keep all follow-up visits as told by your health care provider. This is important. How is this prevented?  Do not douche.  Wash the outside of your vagina with warm water only.  Use protection when having sex. This includes latex condoms and dental dams.  Limit how many sexual partners you have. To help prevent bacterial vaginosis, it is best to have sex with just   one partner (monogamous).  Make sure you and your sexual partner are tested for STIs.  Wear cotton or cotton-lined underwear.  Avoid wearing tight pants and pantyhose, especially during summer.  Limit the amount of alcohol that you drink.  Do not use any products that  contain nicotine or tobacco, such as cigarettes and e-cigarettes. If you need help quitting, ask your health care provider.  Do not use illegal drugs. Where to find more information:  Centers for Disease Control and Prevention: www.cdc.gov/std  American Sexual Health Association (ASHA): www.ashastd.org  [place] Department of Health and Human Services, Office on Women's Health: www.womenshealth.gov/ or https://www.womenshealth.gov/a-z-topics/bacterial-vaginosis Contact a health care provider if:  Your symptoms do not improve, even after treatment.  You have more discharge or pain when urinating.  You have a fever.  You have pain in your abdomen.  You have pain during sex.  You have vaginal bleeding between periods. Summary  Bacterial vaginosis is a vaginal infection that occurs when the normal balance of bacteria in the vagina is disrupted.  Because bacterial vaginosis increases your risk for STIs (sexually transmitted infections), getting treated can help reduce your risk for chlamydia, gonorrhea, herpes, and HIV (human immunodeficiency virus). Treatment is also important for preventing complications in pregnant women, because the condition can cause an early (premature) delivery.  This condition is treated with antibiotic medicines. These may be given as a pill, a vaginal cream, or a medicine that is put into the vagina (suppository). This information is not intended to replace advice given to you by your health care provider. Make sure you discuss any questions you have with your health care provider. Document Released: 08/04/2005 Document Revised: 12/08/2016 Document Reviewed: 04/19/2016 Elsevier Interactive Patient Education  2018 Elsevier Inc.  

## 2018-01-12 ENCOUNTER — Ambulatory Visit (INDEPENDENT_AMBULATORY_CARE_PROVIDER_SITE_OTHER): Payer: BC Managed Care – PPO

## 2018-01-12 ENCOUNTER — Ambulatory Visit (INDEPENDENT_AMBULATORY_CARE_PROVIDER_SITE_OTHER): Payer: BC Managed Care – PPO | Admitting: Certified Nurse Midwife

## 2018-01-12 VITALS — BP 108/70 | HR 80 | Wt 164.2 lb

## 2018-01-12 DIAGNOSIS — Z3492 Encounter for supervision of normal pregnancy, unspecified, second trimester: Secondary | ICD-10-CM

## 2018-01-12 LAB — POCT URINALYSIS DIPSTICK
Bilirubin, UA: NEGATIVE
Blood, UA: NEGATIVE
Glucose, UA: NEGATIVE
Ketones, UA: NEGATIVE
Leukocytes, UA: NEGATIVE
Nitrite, UA: NEGATIVE
Protein, UA: NEGATIVE
Spec Grav, UA: 1.01 (ref 1.010–1.025)
Urobilinogen, UA: 0.2 E.U./dL
pH, UA: 5 (ref 5.0–8.0)

## 2018-01-12 NOTE — Progress Notes (Signed)
Pt is here for an ROB visit. 

## 2018-01-12 NOTE — Progress Notes (Signed)
ROB-Reports carpal tunnel symptoms. Discussed home treatment measures and handouts given. Anatomy scan today complete and normal; findings reviewed with patient and spouse. Anticipatory guidance regarding course of prenatal care. Reviewed red flag symptoms and when to call. RTC x 4 weeks for ROB or sooner if needed.   ULTRASOUND REPORT  Location: [facility] Date of Service:  01/12/2018  Indications: Anatomy Findings:  Nelda Marseille intrauterine pregnancy is visualized with FHR at 127 BPM. Biometrics give an (U/S) Gestational age of 37 3/7 weeks and an (U/S) EDD of 06/05/18; this correlates with the clinically established EDD of 06/02/18.  Fetal presentation is vertex.  EFW: 309 grams (0lb 11oz). Placenta: Posterior and grade 1. AFI: WNL subjectively.  Anatomic survey is complete and appears WNL; Gender - Female.   Right Ovary measures 2.4 x 2.4 x 1.5 cm. It is normal in appearance. Left Ovary measures 2.3 x 1.4 x 1.7 cm. It is normal appearance. There is no obvious evidence of a corpus luteal cyst. Survey of the adnexa demonstrates no adnexal masses. There is no free peritoneal fluid in the cul de sac.  Impression: 1. 19 3/7 week Viable Singleton Intrauterine pregnancy by U/S. 2. (U/S) EDD is consistent with Clinically established (LMP) EDD of 06/02/18. 3. Normal Anatomy Scan  Recommendations: 1.Clinical correlation with the patient's History and Physical Exam.

## 2018-01-12 NOTE — Patient Instructions (Signed)
Back Pain in Pregnancy Back pain during pregnancy is common. Back pain may be caused by several factors that are related to changes during your pregnancy. Follow these instructions at home: Managing pain, stiffness, and swelling  If directed, apply ice for sudden (acute) back pain. ? Put ice in a plastic bag. ? Place a towel between your skin and the bag. ? Leave the ice on for 20 minutes, 2-3 times per day.  If directed, apply heat to the affected area before you exercise: ? Place a towel between your skin and the heat pack or heating pad. ? Leave the heat on for 20-30 minutes. ? Remove the heat if your skin turns bright red. This is especially important if you are unable to feel pain, heat, or cold. You may have a greater risk of getting burned. Activity  Exercise as told by your health care provider. Exercising is the best way to prevent or manage back pain.  Listen to your body when lifting. If lifting hurts, ask for help or bend your knees. This uses your leg muscles instead of your back muscles.  Squat down when picking up something from the floor. Do not bend over.  Only use bed rest as told by your health care provider. Bed rest should only be used for the most severe episodes of back pain. Standing, Sitting, and Lying Down  Do not stand in one place for long periods of time.  Use good posture when sitting. Make sure your head rests over your shoulders and is not hanging forward. Use a pillow on your lower back if necessary.  Try sleeping on your side, preferably the left side, with a pillow or two between your legs. If you are sore after a night's rest, your bed may be too soft. A firm mattress may provide more support for your back during pregnancy. General instructions  Do not wear high heels.  Eat a healthy diet. Try to gain weight within your health care provider's recommendations.  Use a maternity girdle, elastic sling, or back brace as told by your health care  provider.  Take over-the-counter and prescription medicines only as told by your health care provider.  Keep all follow-up visits as told by your health care provider. This is important. This includes any visits with any specialists, such as a physical therapist. Contact a health care provider if:  Your back pain interferes with your daily activities.  You have increasing pain in other parts of your body. Get help right away if:  You develop numbness, tingling, weakness, or problems with the use of your arms or legs.  You develop severe back pain that is not controlled with medicine.  You have a sudden change in bowel or bladder control.  You develop shortness of breath, dizziness, or you faint.  You develop nausea, vomiting, or sweating.  You have back pain that is a rhythmic, cramping pain similar to labor pains. Labor pain is usually 1-2 minutes apart, lasts for about 1 minute, and involves a bearing down feeling or pressure in your pelvis.  You have back pain and your water breaks or you have vaginal bleeding.  You have back pain or numbness that travels down your leg.  Your back pain developed after you fell.  You develop pain on one side of your back.  You see blood in your urine.  You develop skin blisters in the area of your back pain. This information is not intended to replace advice given to you   by your health care provider. Make sure you discuss any questions you have with your health care provider. Document Released: 11/12/2005 Document Revised: 01/10/2016 Document Reviewed: 04/18/2015 Elsevier Interactive Patient Education  2018 Elsevier Inc. Round Ligament Pain The round ligament is a cord of muscle and tissue that helps to support the uterus. It can become a source of pain during pregnancy if it becomes stretched or twisted as the baby grows. The pain usually begins in the second trimester of pregnancy, and it can come and go until the baby is delivered. It is  not a serious problem, and it does not cause harm to the baby. Round ligament pain is usually a short, sharp, and pinching pain, but it can also be a dull, lingering, and aching pain. The pain is felt in the lower side of the abdomen or in the groin. It usually starts deep in the groin and moves up to the outside of the hip area. Pain can occur with:  A sudden change in position.  Rolling over in bed.  Coughing or sneezing.  Physical activity.  Follow these instructions at home: Watch your condition for any changes. Take these steps to help with your pain:  When the pain starts, relax. Then try: ? Sitting down. ? Flexing your knees up to your abdomen. ? Lying on your side with one pillow under your abdomen and another pillow between your legs. ? Sitting in a warm bath for 15-20 minutes or until the pain goes away.  Take over-the-counter and prescription medicines only as told by your health care provider.  Move slowly when you sit and stand.  Avoid long walks if they cause pain.  Stop or lessen your physical activities if they cause pain.  Contact a health care provider if:  Your pain does not go away with treatment.  You feel pain in your back that you did not have before.  Your medicine is not helping. Get help right away if:  You develop a fever or chills.  You develop uterine contractions.  You develop vaginal bleeding.  You develop nausea or vomiting.  You develop diarrhea.  You have pain when you urinate. This information is not intended to replace advice given to you by your health care provider. Make sure you discuss any questions you have with your health care provider. Document Released: 05/13/2008 Document Revised: 01/10/2016 Document Reviewed: 10/11/2014 Elsevier Interactive Patient Education  2018 Elsevier Inc.  

## 2018-01-26 ENCOUNTER — Telehealth: Payer: Self-pay | Admitting: Obstetrics and Gynecology

## 2018-01-26 NOTE — Telephone Encounter (Signed)
The patient called and stated that she wants to ask [name] about some supplements she can take while she is pregnant. Please advise.

## 2018-01-27 NOTE — Telephone Encounter (Signed)
She called again today requesting something OTC for her hormones and feeling out of balance.  Just wants advice on what she can take to help level her mood out.  Please advise, thanks.

## 2018-01-27 NOTE — Telephone Encounter (Signed)
Sounds like she needs to come in and be evaluated for depression. Please have her come in to see [facility].   Thanks,  Deneise Lever

## 2018-01-29 ENCOUNTER — Telehealth: Payer: Self-pay | Admitting: Certified Nurse Midwife

## 2018-01-29 DIAGNOSIS — G5600 Carpal tunnel syndrome, unspecified upper limb: Secondary | ICD-10-CM

## 2018-01-29 NOTE — Telephone Encounter (Signed)
He patient called and stated that she would like to speak with a nurse or provider in regards to getting a number to call a doctor that helps with carpaltunel as soon  As she can. Please advise.

## 2018-02-02 NOTE — Addendum Note (Signed)
Addended by: Edwyna Shell on: 02/02/2018 09:30 AM   Modules accepted: Orders

## 2018-02-02 NOTE — Telephone Encounter (Signed)
Spoke with pt and informed her that a referral had been sent in to [facility] for her carpal tunnel.

## 2018-02-15 ENCOUNTER — Encounter: Payer: BC Managed Care – PPO | Admitting: Certified Nurse Midwife

## 2018-02-17 ENCOUNTER — Other Ambulatory Visit: Payer: Self-pay

## 2018-02-17 DIAGNOSIS — Z3492 Encounter for supervision of normal pregnancy, unspecified, second trimester: Secondary | ICD-10-CM

## 2018-02-19 ENCOUNTER — Ambulatory Visit (INDEPENDENT_AMBULATORY_CARE_PROVIDER_SITE_OTHER): Payer: BC Managed Care – PPO | Admitting: Certified Nurse Midwife

## 2018-02-19 ENCOUNTER — Encounter: Payer: Self-pay | Admitting: Certified Nurse Midwife

## 2018-02-19 VITALS — BP 102/62 | HR 77 | Wt 164.2 lb

## 2018-02-19 DIAGNOSIS — Z3492 Encounter for supervision of normal pregnancy, unspecified, second trimester: Secondary | ICD-10-CM

## 2018-02-19 LAB — POCT URINALYSIS DIPSTICK
Blood, UA: NEGATIVE
Glucose, UA: NEGATIVE
Ketones, UA: NEGATIVE
Nitrite, UA: NEGATIVE
Odor: NEGATIVE
Protein, UA: POSITIVE — AB
Spec Grav, UA: 1.01 (ref 1.010–1.025)
Urobilinogen, UA: 0.2 E.U./dL
pH, UA: 7.5 (ref 5.0–8.0)

## 2018-02-19 NOTE — Progress Notes (Signed)
ROB- no complaints.

## 2018-02-19 NOTE — Progress Notes (Signed)
ROB, doing well. Feels good movement. Anticipatory guidance for 28 wk labs at next visit in 3 wks.   Philip Aspen, CNM

## 2018-02-19 NOTE — Patient Instructions (Signed)

## 2018-02-22 ENCOUNTER — Telehealth: Payer: Self-pay | Admitting: Obstetrics and Gynecology

## 2018-02-22 NOTE — Telephone Encounter (Signed)
The patient would prefer to be reached at her work number [phone].

## 2018-02-22 NOTE — Telephone Encounter (Signed)
The patient called and stated that she is experiencing sharp pain on her right breast, its swollen  And red annd she can also feel a lump in that area as well. The patient would like to speak with a nurse as soon as possible. Please advise.

## 2018-02-23 ENCOUNTER — Ambulatory Visit: Payer: BC Managed Care – PPO | Admitting: Obstetrics and Gynecology

## 2018-02-23 VITALS — BP 96/56 | HR 84 | Wt 166.8 lb

## 2018-02-23 DIAGNOSIS — O91219 Nonpurulent mastitis associated with pregnancy, unspecified trimester: Secondary | ICD-10-CM

## 2018-02-23 DIAGNOSIS — Z3492 Encounter for supervision of normal pregnancy, unspecified, second trimester: Secondary | ICD-10-CM

## 2018-02-23 DIAGNOSIS — Z3A25 25 weeks gestation of pregnancy: Secondary | ICD-10-CM

## 2018-02-23 DIAGNOSIS — O91212 Nonpurulent mastitis associated with pregnancy, second trimester: Secondary | ICD-10-CM

## 2018-02-23 LAB — POCT URINALYSIS DIPSTICK
Bilirubin, UA: NEGATIVE
Blood, UA: NEGATIVE
Glucose, UA: NEGATIVE
Ketones, UA: NEGATIVE
Leukocytes, UA: NEGATIVE
Nitrite, UA: NEGATIVE
Protein, UA: NEGATIVE
Spec Grav, UA: 1.015 (ref 1.010–1.025)
Urobilinogen, UA: 0.2 E.U./dL
pH, UA: 6 (ref 5.0–8.0)

## 2018-02-23 MED ORDER — DICLOXACILLIN SODIUM 500 MG PO CAPS: 500.0000 mg | ORAL_CAPSULE | Freq: Three times a day (TID) | ORAL | 1 refills | Status: DC

## 2018-02-23 MED ORDER — FLUCONAZOLE 150 MG PO TABS: 150.0000 mg | ORAL_TABLET | Freq: Once | ORAL | 3 refills | Status: AC

## 2018-02-23 NOTE — Telephone Encounter (Signed)
appt made for pt

## 2018-02-23 NOTE — Progress Notes (Signed)
Problem OB-complains of redness and pain over upper right breast x 4 days, and now feels a lump. Denies fever or other symptoms. Heat does help. Centralized redness noted in upper right breast. Warm to touch with 3cm mobile lump noted underneath. Mastitis discussed and antibiotic/ diflucan prescribed. Tylenol as needed and increase water intake.

## 2018-02-23 NOTE — Patient Instructions (Signed)
Mastitis  Mastitis is inflammation of the breast tissue. It occurs most often in women who are breastfeeding, but it can also affect other women, and even sometimes men.  What are the causes?  Mastitis is usually caused by a bacterial infection. Bacteria enter the breast tissue through cuts or openings in the skin. Typically, this occurs with breastfeeding because of cracked or irritated skin. Sometimes, it can occur even when there is no opening in the skin. It can be associated with plugged milk (lactiferous) ducts. Nipple piercing can also lead to mastitis. Also, some forms of breast cancer can cause mastitis.  What are the signs or symptoms?  · Swelling, redness, tenderness, and pain in an area of the breast.  · Swelling of the glands under the arm on the same side.  · Fever.  If an infection is allowed to progress, a collection of pus (abscess) may develop.  How is this diagnosed?  Your health care provider can usually diagnose mastitis based on your symptoms and a physical exam. Tests may be done to help confirm the diagnosis. These may include:  · Removal of pus from the breast by applying pressure to the area. This pus can be examined in the lab to determine which bacteria are present. If an abscess has developed, the fluid in the abscess can be removed with a needle. This can also be used to confirm the diagnosis and determine the bacteria present. In most cases, pus will not be present.  · Blood tests to determine if your body is fighting a bacterial infection.  · Mammogram or ultrasound tests to rule out other problems or diseases.    How is this treated?  Antibiotic medicine is used to treat a bacterial infection. Your health care provider will determine which bacteria are most likely causing the infection and will select an appropriate antibiotic. This is sometimes changed based on the results of tests performed to identify the bacteria, or if there is no response to the antibiotic selected. Antibiotics  are usually given by mouth. You may also be given medicine for pain.  Mastitis that occurs with breastfeeding will sometimes go away on its own, so your health care provider may choose to wait 24 hours after first seeing you to decide whether a prescription medicine is needed.  Follow these instructions at home:  · Only take over-the-counter or prescription medicines for pain, fever, or discomfort as directed by your health care provider.  · If your health care provider prescribed an antibiotic, take the medicine as directed. Make sure you finish it even if you start to feel better.  · Do not wear a tight or underwire bra. Wear a soft, supportive bra.  · Increase your fluid intake, especially if you have a fever.  · Women who are breastfeeding should follow these instructions:  ? Continue to empty the breast. Your health care provider can tell you whether this milk is safe for your infant or needs to be thrown out. You may be told to stop nursing until your health care provider thinks it is safe for your baby. Use a breast pump if you are advised to stop nursing.  ? Keep your nipples clean and dry.  ? Empty the first breast completely before going to the other breast. If your baby is not emptying your breasts completely for some reason, use a breast pump to empty your breasts.  ? If you go back to work, pump your breasts while at work to stay   in time with your nursing schedule.  ? Avoid allowing your breasts to become overly filled with milk (engorged).  Contact a health care provider if:  · You have pus-like discharge from the breast.  · Your symptoms do not improve with the treatment prescribed by your health care provider within 2 days.  Get help right away if:  · Your pain and swelling are getting worse.  · You have pain that is not controlled with medicine.  · You have a red line extending from the breast toward your armpit.  · You have a fever or persistent symptoms for more than 2-3 days.  · You have a fever  and your symptoms suddenly get worse.  This information is not intended to replace advice given to you by your health care provider. Make sure you discuss any questions you have with your health care provider.  Document Released: 08/04/2005 Document Revised: 01/10/2016 Document Reviewed: 03/04/2013  Elsevier Interactive Patient Education © 2017 Elsevier Inc.

## 2018-02-23 NOTE — Progress Notes (Signed)
ROB- pt is having pain in her R breast, redness, pt can fill at "lump"

## 2018-03-04 ENCOUNTER — Telehealth: Payer: Self-pay | Admitting: Obstetrics and Gynecology

## 2018-03-04 NOTE — Telephone Encounter (Signed)
Yes it can take a while for it to go away, just try not to massage the area or stimulate it

## 2018-03-04 NOTE — Telephone Encounter (Signed)
The patient called and stated that she has concerns because she still has a knot on her breast, and she wanted to know if it took time to go away after antibiotics, The patient would like a call back if possible. Please advise.

## 2018-03-11 ENCOUNTER — Ambulatory Visit (INDEPENDENT_AMBULATORY_CARE_PROVIDER_SITE_OTHER): Payer: BC Managed Care – PPO | Admitting: Obstetrics and Gynecology

## 2018-03-11 ENCOUNTER — Other Ambulatory Visit: Payer: BC Managed Care – PPO

## 2018-03-11 VITALS — BP 123/70 | HR 82 | Wt 171.8 lb

## 2018-03-11 DIAGNOSIS — Z130 Encounter for screening for diseases of the blood and blood-forming organs and certain disorders involving the immune mechanism: Secondary | ICD-10-CM

## 2018-03-11 DIAGNOSIS — Z3493 Encounter for supervision of normal pregnancy, unspecified, third trimester: Secondary | ICD-10-CM

## 2018-03-11 DIAGNOSIS — Z3A27 27 weeks gestation of pregnancy: Secondary | ICD-10-CM

## 2018-03-11 DIAGNOSIS — Z131 Encounter for screening for diabetes mellitus: Secondary | ICD-10-CM

## 2018-03-11 NOTE — Patient Instructions (Signed)

## 2018-03-11 NOTE — Progress Notes (Signed)
Glucola & ROB-doing well, breast lump from mastitis still present.no change on clinical exam, offered breast ultrasound-declined at this time.

## 2018-03-11 NOTE — Progress Notes (Signed)
ROB- glucola done today, blood consent signed, tdap given , R breast pain, still has lump , redness on breast

## 2018-03-12 LAB — CBC
Hematocrit: 36 % (ref 34.0–46.6)
Hemoglobin: 11.8 g/dL (ref 11.1–15.9)
MCH: 32.5 pg (ref 26.6–33.0)
MCHC: 32.8 g/dL (ref 31.5–35.7)
MCV: 99 fL — ABNORMAL HIGH (ref 79–97)
Platelets: 409 10*3/uL (ref 150–450)
RBC: 3.63 x10E6/uL — ABNORMAL LOW (ref 3.77–5.28)
RDW: 12 % — ABNORMAL LOW (ref 12.3–15.4)
WBC: 11.9 10*3/uL — ABNORMAL HIGH (ref 3.4–10.8)

## 2018-03-12 LAB — RPR: RPR Ser Ql: NONREACTIVE

## 2018-03-12 LAB — RUBELLA SCREEN: Rubella Antibodies, IGG: 1.8 index (ref >0.99)

## 2018-03-12 LAB — GLUCOSE, 1 HOUR GESTATIONAL: Gestational Diabetes Screen: 161 mg/dL — ABNORMAL HIGH (ref 65–139)

## 2018-03-16 ENCOUNTER — Other Ambulatory Visit: Payer: Self-pay | Admitting: Obstetrics and Gynecology

## 2018-03-16 DIAGNOSIS — O99810 Abnormal glucose complicating pregnancy: Secondary | ICD-10-CM

## 2018-03-19 ENCOUNTER — Other Ambulatory Visit: Payer: BC Managed Care – PPO

## 2018-03-26 ENCOUNTER — Other Ambulatory Visit: Payer: BC Managed Care – PPO

## 2018-03-26 ENCOUNTER — Encounter: Payer: BC Managed Care – PPO | Admitting: Certified Nurse Midwife

## 2018-04-01 ENCOUNTER — Ambulatory Visit (INDEPENDENT_AMBULATORY_CARE_PROVIDER_SITE_OTHER): Payer: BC Managed Care – PPO | Admitting: Certified Nurse Midwife

## 2018-04-01 ENCOUNTER — Other Ambulatory Visit: Payer: BC Managed Care – PPO

## 2018-04-01 VITALS — BP 121/74 | HR 79 | Wt 173.2 lb

## 2018-04-01 DIAGNOSIS — O99810 Abnormal glucose complicating pregnancy: Secondary | ICD-10-CM

## 2018-04-01 DIAGNOSIS — Z3493 Encounter for supervision of normal pregnancy, unspecified, third trimester: Secondary | ICD-10-CM

## 2018-04-01 DIAGNOSIS — Z3A29 29 weeks gestation of pregnancy: Secondary | ICD-10-CM

## 2018-04-01 LAB — POCT URINALYSIS DIPSTICK
Bilirubin, UA: NEGATIVE
Blood, UA: NEGATIVE
Glucose, UA: NEGATIVE
Ketones, UA: NEGATIVE
Leukocytes, UA: NEGATIVE
Nitrite, UA: NEGATIVE
Protein, UA: NEGATIVE
Spec Grav, UA: 1.01 (ref 1.010–1.025)
Urobilinogen, UA: 0.2 E.U./dL
pH, UA: 5 (ref 5.0–8.0)

## 2018-04-01 NOTE — Progress Notes (Signed)
ROB- Doing well, no questions or concerns. GTT today, anticipatory guidance regarding antenatal care if positive for gestational diabetes. Plan epidural in labs, FOB and mother for labor support. Declined information regarding doula program. Has chosen [facility]. Plans on naming baby Lilia Pro. Reviewed red flag symptoms and when to call. Will contact patient via my Chart with results. RTC x 2 weeks or sooner if needed.    Miguel Rota, Student Nurse Practitioner [facility]

## 2018-04-01 NOTE — Progress Notes (Signed)
Pt is here for an [facility] visit. Is doing 3 hour glucose.

## 2018-04-01 NOTE — Patient Instructions (Signed)
WHAT OB PATIENTS CAN EXPECT   Confirmation of pregnancy and ultrasound ordered if medically indicated-[redacted] weeks gestation  New OB (NOB) intake with nurse and New OB (NOB) labs- [redacted] weeks gestation  New OB (NOB) physical examination with provider- 11/[redacted] weeks gestation  Flu vaccine-[redacted] weeks gestation  Anatomy scan-[redacted] weeks gestation  Glucose tolerance test, blood work to test for anemia, T-dap vaccine-[redacted] weeks gestation  Vaginal swabs/cultures-STD/Group B strep-[redacted] weeks gestation  Appointments every 4 weeks until 28 weeks  Every 2 weeks from 28 weeks until 36 weeks  Weekly visits from 36 weeks until delivery  Fetal Movement Counts Patient Name: ________________________________________________ Patient Due Date: ____________________ What is a fetal movement count? A fetal movement count is the number of times that you feel your baby move during a certain amount of time. This may also be called a fetal kick count. A fetal movement count is recommended for every pregnant woman. You may be asked to start counting fetal movements as early as week 28 of your pregnancy. Pay attention to when your baby is most active. You may notice your baby's sleep and wake cycles. You may also notice things that make your baby move more. You should do a fetal movement count:  When your baby is normally most active.  At the same time each day.  A good time to count movements is while you are resting, after having something to eat and drink. How do I count fetal movements? 1. Find a quiet, comfortable area. Sit, or lie down on your side. 2. Write down the date, the start time and stop time, and the number of movements that you felt between those two times. Take this information with you to your health care visits. 3. For 2 hours, count kicks, flutters, swishes, rolls, and jabs. You should feel at least 10 movements during 2 hours. 4. You may stop counting after you have felt 10 movements. 5. If you do not  feel 10 movements in 2 hours, have something to eat and drink. Then, keep resting and counting for 1 hour. If you feel at least 4 movements during that hour, you may stop counting. Contact a health care provider if:  You feel fewer than 4 movements in 2 hours.  Your baby is not moving like he or she usually does. Date: ____________ Start time: ____________ Stop time: ____________ Movements: ____________ Date: ____________ Start time: ____________ Stop time: ____________ Movements: ____________ Date: ____________ Start time: ____________ Stop time: ____________ Movements: ____________ Date: ____________ Start time: ____________ Stop time: ____________ Movements: ____________ Date: ____________ Start time: ____________ Stop time: ____________ Movements: ____________ Date: ____________ Start time: ____________ Stop time: ____________ Movements: ____________ Date: ____________ Start time: ____________ Stop time: ____________ Movements: ____________ Date: ____________ Start time: ____________ Stop time: ____________ Movements: ____________ Date: ____________ Start time: ____________ Stop time: ____________ Movements: ____________ This information is not intended to replace advice given to you by your health care provider. Make sure you discuss any questions you have with your health care provider. Document Released: 09/03/2006 Document Revised: 04/02/2016 Document Reviewed: 09/13/2015 Elsevier Interactive Patient Education  [employer].

## 2018-04-02 LAB — GESTATIONAL GLUCOSE TOLERANCE
Glucose, Fasting: 79 mg/dL (ref 65–94)
Glucose, GTT - 1 Hour: 177 mg/dL (ref 65–179)
Glucose, GTT - 2 Hour: 140 mg/dL (ref 65–154)
Glucose, GTT - 3 Hour: 49 mg/dL — ABNORMAL LOW (ref 65–139)

## 2018-04-16 ENCOUNTER — Encounter: Payer: Self-pay | Admitting: Certified Nurse Midwife

## 2018-04-16 ENCOUNTER — Ambulatory Visit (INDEPENDENT_AMBULATORY_CARE_PROVIDER_SITE_OTHER): Payer: BC Managed Care – PPO | Admitting: Certified Nurse Midwife

## 2018-04-16 VITALS — BP 131/73 | HR 101 | Wt 173.5 lb

## 2018-04-16 DIAGNOSIS — Z3493 Encounter for supervision of normal pregnancy, unspecified, third trimester: Secondary | ICD-10-CM | POA: Diagnosis not present

## 2018-04-16 LAB — POCT URINALYSIS DIPSTICK OB
Bilirubin, UA: NEGATIVE
Blood, UA: NEGATIVE
Ketones, UA: NEGATIVE
Leukocytes, UA: NEGATIVE
Nitrite, UA: NEGATIVE
POC,PROTEIN,UA: NEGATIVE
Spec Grav, UA: <=1.005 — AB (ref 1.010–1.025)
Urobilinogen, UA: 0.2 E.U./dL
pH, UA: 6 (ref 5.0–8.0)

## 2018-04-16 NOTE — Patient Instructions (Signed)

## 2018-04-16 NOTE — Progress Notes (Signed)
Body mass index is 32.78 kg/m.   ROB, doing well. Feels good movement. Pt has concern about weight gain. Reviewed and discussed recommendations of 11-20 lbs she has gain 7 with pregnancy. Reassurance given.  Discussed that she will have GBS culture at 36 wks. She verbalizes and agrees to plan. Follow up 2 wks.   Philip Aspen, CNM

## 2018-04-27 ENCOUNTER — Ambulatory Visit (INDEPENDENT_AMBULATORY_CARE_PROVIDER_SITE_OTHER): Payer: BC Managed Care – PPO | Admitting: Obstetrics and Gynecology

## 2018-04-27 VITALS — BP 134/76 | HR 77 | Wt 176.0 lb

## 2018-04-27 DIAGNOSIS — Z3685 Encounter for antenatal screening for Streptococcus B: Secondary | ICD-10-CM

## 2018-04-27 DIAGNOSIS — Z3493 Encounter for supervision of normal pregnancy, unspecified, third trimester: Secondary | ICD-10-CM | POA: Diagnosis not present

## 2018-04-27 DIAGNOSIS — Z113 Encounter for screening for infections with a predominantly sexual mode of transmission: Secondary | ICD-10-CM

## 2018-04-27 LAB — POCT URINALYSIS DIPSTICK OB
Bilirubin, UA: NEGATIVE
Blood, UA: NEGATIVE
Glucose, UA: NEGATIVE
Ketones, UA: NEGATIVE
Leukocytes, UA: NEGATIVE
Nitrite, UA: NEGATIVE
POC,PROTEIN,UA: NEGATIVE
Spec Grav, UA: 1.015 (ref 1.010–1.025)
Urobilinogen, UA: 0.2 E.U./dL
pH, UA: 6.5 (ref 5.0–8.0)

## 2018-04-27 NOTE — Progress Notes (Signed)
OB WORK IN- pt c/o having contractions q 10 minutes, she is having them in her back- hurting bad started this am

## 2018-04-27 NOTE — Addendum Note (Signed)
Addended by: Keturah Barre L on: 04/27/2018 02:20 PM   Modules accepted: Orders

## 2018-04-27 NOTE — Progress Notes (Signed)
Work in [employer]- reports increased but irregular contractions since last night, hurting in low back and not sleeping well. Reassured of normal findings, uterus soft to palpation, cultures obtained anyway (last infant delivered at 38 weeks), did have sex yesterday evening before this started. Labor precautions obtained.

## 2018-04-27 NOTE — Patient Instructions (Signed)
Braxton Hicks Contractions °Contractions of the uterus can occur throughout pregnancy, but they are not always a sign that you are in labor. You may have practice contractions called Braxton Hicks contractions. These false labor contractions are sometimes confused with true labor. °What are Braxton Hicks contractions? °Braxton Hicks contractions are tightening movements that occur in the muscles of the uterus before labor. Unlike true labor contractions, these contractions do not result in opening (dilation) and thinning of the cervix. Toward the end of pregnancy (32-34 weeks), Braxton Hicks contractions can happen more often and may become stronger. These contractions are sometimes difficult to tell apart from true labor because they can be very uncomfortable. You should not feel embarrassed if you go to the hospital with false labor. °Sometimes, the only way to tell if you are in true labor is for your health care provider to look for changes in the cervix. The health care provider will do a physical exam and may monitor your contractions. If you are not in true labor, the exam should show that your cervix is not dilating and your water has not broken. °If there are other health problems associated with your pregnancy, it is completely safe for you to be sent home with false labor. You may continue to have Braxton Hicks contractions until you go into true labor. °How to tell the difference between true labor and false labor °True labor °· Contractions last 30-70 seconds. °· Contractions become very regular. °· Discomfort is usually felt in the top of the uterus, and it spreads to the lower abdomen and low back. °· Contractions do not go away with walking. °· Contractions usually become more intense and increase in frequency. °· The cervix dilates and gets thinner. °False labor °· Contractions are usually shorter and not as strong as true labor contractions. °· Contractions are usually irregular. °· Contractions  are often felt in the front of the lower abdomen and in the groin. °· Contractions may go away when you walk around or change positions while lying down. °· Contractions get weaker and are shorter-lasting as time goes on. °· The cervix usually does not dilate or become thin. °Follow these instructions at home: °· Take over-the-counter and prescription medicines only as told by your health care provider. °· Keep up with your usual exercises and follow other instructions from your health care provider. °· Eat and drink lightly if you think you are going into labor. °· If Braxton Hicks contractions are making you uncomfortable: °? Change your position from lying down or resting to walking, or change from walking to resting. °? Sit and rest in a tub of warm water. °? Drink enough fluid to keep your urine pale yellow. Dehydration may cause these contractions. °? Do slow and deep breathing several times an hour. °· Keep all follow-up prenatal visits as told by your health care provider. This is important. °Contact a health care provider if: °· You have a fever. °· You have continuous pain in your abdomen. °Get help right away if: °· Your contractions become stronger, more regular, and closer together. °· You have fluid leaking or gushing from your vagina. °· You pass blood-tinged mucus (bloody show). °· You have bleeding from your vagina. °· You have low back pain that you never had before. °· You feel your baby’s head pushing down and causing pelvic pressure. °· Your baby is not moving inside you as much as it used to. °Summary °· Contractions that occur before labor are called Braxton   Hicks contractions, false labor, or practice contractions. °· Braxton Hicks contractions are usually shorter, weaker, farther apart, and less regular than true labor contractions. True labor contractions usually become progressively stronger and regular and they become more frequent. °· Manage discomfort from Braxton Hicks contractions by  changing position, resting in a warm bath, drinking plenty of water, or practicing deep breathing. °This information is not intended to replace advice given to you by your health care provider. Make sure you discuss any questions you have with your health care provider. °Document Released: 12/18/2016 Document Revised: 12/18/2016 Document Reviewed: 12/18/2016 °Elsevier Interactive Patient Education © 2018 Elsevier Inc. ° °

## 2018-04-29 ENCOUNTER — Encounter: Payer: BC Managed Care – PPO | Admitting: Obstetrics and Gynecology

## 2018-04-29 LAB — GC/CHLAMYDIA PROBE AMP
Chlamydia trachomatis, NAA: NEGATIVE
Neisseria gonorrhoeae by PCR: NEGATIVE

## 2018-04-29 LAB — STREP GP B NAA: Strep Gp B NAA: NEGATIVE

## 2018-05-05 ENCOUNTER — Ambulatory Visit (INDEPENDENT_AMBULATORY_CARE_PROVIDER_SITE_OTHER): Payer: BC Managed Care – PPO | Admitting: Certified Nurse Midwife

## 2018-05-05 ENCOUNTER — Encounter: Payer: Self-pay | Admitting: Certified Nurse Midwife

## 2018-05-05 VITALS — BP 118/85 | HR 93 | Wt 176.1 lb

## 2018-05-05 DIAGNOSIS — Z3493 Encounter for supervision of normal pregnancy, unspecified, third trimester: Secondary | ICD-10-CM | POA: Diagnosis not present

## 2018-05-05 DIAGNOSIS — Z23 Encounter for immunization: Secondary | ICD-10-CM | POA: Diagnosis not present

## 2018-05-05 LAB — POCT URINALYSIS DIPSTICK OB
Bilirubin, UA: NEGATIVE
Blood, UA: NEGATIVE
Glucose, UA: NEGATIVE
Ketones, UA: NEGATIVE
Leukocytes, UA: NEGATIVE
Nitrite, UA: NEGATIVE
Spec Grav, UA: 1.015 (ref 1.010–1.025)
Urobilinogen, UA: 0.2 E.U./dL
pH, UA: 6.5 (ref 5.0–8.0)

## 2018-05-05 NOTE — Patient Instructions (Signed)
Carpal Tunnel Syndrome Carpal tunnel syndrome is a condition that causes pain in your hand and arm. The carpal tunnel is a narrow area located on the palm side of your wrist. Repeated wrist motion or certain diseases may cause swelling within the tunnel. This swelling pinches the main nerve in the wrist (median nerve). What are the causes? This condition may be caused by:  Repeated wrist motions.  Wrist injuries.  Arthritis.  A cyst or tumor in the carpal tunnel.  Fluid buildup during pregnancy.  Sometimes the cause of this condition is not known. What increases the risk? This condition is more likely to develop in:  People who have jobs that cause them to repeatedly move their wrists in the same motion, such as butchers and cashiers.  Women.  People with certain conditions, such as: ? Diabetes. ? Obesity. ? An underactive thyroid (hypothyroidism). ? Kidney failure.  What are the signs or symptoms? Symptoms of this condition include:  A tingling feeling in your fingers, especially in your thumb, index, and middle fingers.  Tingling or numbness in your hand.  An aching feeling in your entire arm, especially when your wrist and elbow are bent for long periods of time.  Wrist pain that goes up your arm to your shoulder.  Pain that goes down into your palm or fingers.  A weak feeling in your hands. You may have trouble grabbing and holding items.  Your symptoms may feel worse during the night. How is this diagnosed? This condition is diagnosed with a medical history and physical exam. You may also have tests, including:  An electromyogram (EMG). This test measures electrical signals sent by your nerves into the muscles.  X-rays.  How is this treated? Treatment for this condition includes:  Lifestyle changes. It is important to stop doing or modify the activity that caused your condition.  Physical or occupational therapy.  Medicines for pain and inflammation.  This may include medicine that is injected into your wrist.  A wrist splint.  Surgery.  Follow these instructions at home: If you have a splint:  Wear it as told by your health care provider. Remove it only as told by your health care provider.  Loosen the splint if your fingers become numb and tingle, or if they turn cold and blue.  Keep the splint clean and dry. General instructions  Take over-the-counter and prescription medicines only as told by your health care provider.  Rest your wrist from any activity that may be causing your pain. If your condition is work related, talk to your employer about changes that can be made, such as getting a wrist pad to use while typing.  If directed, apply ice to the painful area: ? Put ice in a plastic bag. ? Place a towel between your skin and the bag. ? Leave the ice on for 20 minutes, 2-3 times per day.  Keep all follow-up visits as told by your health care provider. This is important.  Do any exercises as told by your health care provider, physical therapist, or occupational therapist. Contact a health care provider if:  You have new symptoms.  Your pain is not controlled with medicines.  Your symptoms get worse. This information is not intended to replace advice given to you by your health care provider. Make sure you discuss any questions you have with your health care provider. Document Released: 08/01/2000 Document Revised: 12/13/2015 Document Reviewed: 12/20/2014 Elsevier Interactive Patient Education  2018 Elsevier Inc.  

## 2018-05-05 NOTE — Progress Notes (Signed)
ROB , feels good movement. Declines SVE today. GBS was done last visit. Results reviewed with pt. Pt request to go out on medical leave due to carple tunnel in both hands. She states she is unable to get any more injections and is unable to work. Letter given. Follow up 1 wk.   Philip Aspen, CNM

## 2018-05-11 ENCOUNTER — Ambulatory Visit (INDEPENDENT_AMBULATORY_CARE_PROVIDER_SITE_OTHER): Payer: BC Managed Care – PPO | Admitting: Obstetrics and Gynecology

## 2018-05-11 ENCOUNTER — Encounter: Payer: BC Managed Care – PPO | Admitting: Certified Nurse Midwife

## 2018-05-11 VITALS — BP 127/82 | HR 76 | Wt 177.4 lb

## 2018-05-11 DIAGNOSIS — Z3493 Encounter for supervision of normal pregnancy, unspecified, third trimester: Secondary | ICD-10-CM

## 2018-05-11 NOTE — Progress Notes (Signed)
ROB- doing well except for carpal tunnel( just got injections this am) labor precautions discussed.

## 2018-05-21 ENCOUNTER — Ambulatory Visit (INDEPENDENT_AMBULATORY_CARE_PROVIDER_SITE_OTHER): Payer: BC Managed Care – PPO | Admitting: Certified Nurse Midwife

## 2018-05-21 VITALS — BP 114/86 | HR 88 | Wt 175.2 lb

## 2018-05-21 DIAGNOSIS — Z3493 Encounter for supervision of normal pregnancy, unspecified, third trimester: Secondary | ICD-10-CM | POA: Diagnosis not present

## 2018-05-21 LAB — POCT URINALYSIS DIPSTICK OB
Bilirubin, UA: NEGATIVE
Blood, UA: NEGATIVE
Glucose, UA: NEGATIVE
Ketones, UA: NEGATIVE
Leukocytes, UA: NEGATIVE
Nitrite, UA: NEGATIVE
POC,PROTEIN,UA: NEGATIVE
Spec Grav, UA: 1.01 (ref 1.010–1.025)
Urobilinogen, UA: 0.2 E.U./dL
pH, UA: 6.5 (ref 5.0–8.0)

## 2018-05-21 NOTE — Patient Instructions (Signed)
Vaginal Delivery Vaginal delivery means that you will give birth by pushing your baby out of your birth canal (vagina). A team of health care providers will help you before, during, and after vaginal delivery. Birth experiences are unique for every woman and every pregnancy, and birth experiences vary depending on where you choose to give birth. What should I do to prepare for my baby's birth? Before your baby is born, it is important to talk with your health care provider about:  Your labor and delivery preferences. These may include: ? Medicines that you may be given. ? How you will manage your pain. This might include non-medical pain relief techniques or injectable pain relief such as epidural analgesia. ? How you and your baby will be monitored during labor and delivery. ? Who may be in the labor and delivery room with you. ? Your feelings about surgical delivery of your baby (cesarean delivery, or C-section) if this becomes necessary. ? Your feelings about receiving donated blood through an IV tube (blood transfusion) if this becomes necessary.  Whether you are able: ? To take pictures or videos of the birth. ? To eat during labor and delivery. ? To move around, walk, or change positions during labor and delivery.  What to expect after your baby is born, such as: ? Whether delayed umbilical cord clamping and cutting is offered. ? Who will care for your baby right after birth. ? Medicines or tests that may be recommended for your baby. ? Whether breastfeeding is supported in your hospital or birth center. ? How long you will be in the hospital or birth center.  How any medical conditions you have may affect your baby or your labor and delivery experience.  To prepare for your baby's birth, you should also:  Attend all of your health care visits before delivery (prenatal visits) as recommended by your health care provider. This is important.  Prepare your home for your baby's  arrival. Make sure that you have: ? Diapers. ? Baby clothing. ? Feeding equipment. ? Safe sleeping arrangements for you and your baby.  Install a car seat in your vehicle. Have your car seat checked by a certified car seat installer to make sure that it is installed safely.  Think about who will help you with your new baby at home for at least the first several weeks after delivery.  What can I expect when I arrive at the birth center or hospital? Once you are in labor and have been admitted into the hospital or birth center, your health care provider may:  Review your pregnancy history and any concerns you have.  Insert an IV tube into one of your veins. This is used to give you fluids and medicines.  Check your blood pressure, pulse, temperature, and heart rate (vital signs).  Check whether your bag of water (amniotic sac) has broken (ruptured).  Talk with you about your birth plan and discuss pain control options.  Monitoring Your health care provider may monitor your contractions (uterine monitoring) and your baby's heart rate (fetal monitoring). You may need to be monitored:  Often, but not continuously (intermittently).  All the time or for long periods at a time (continuously). Continuous monitoring may be needed if: ? You are taking certain medicines, such as medicine to relieve pain or make your contractions stronger. ? You have pregnancy or labor complications.  Monitoring may be done by:  Placing a special stethoscope or a handheld monitoring device on your abdomen to   check your baby's heartbeat, and feeling your abdomen for contractions. This method of monitoring does not continuously record your baby's heartbeat or your contractions.  Placing monitors on your abdomen (external monitors) to record your baby's heartbeat and the frequency and length of contractions. You may not have to wear external monitors all the time.  Placing monitors inside of your uterus  (internal monitors) to record your baby's heartbeat and the frequency, length, and strength of your contractions. ? Your health care provider may use internal monitors if he or she needs more information about the strength of your contractions or your baby's heart rate. ? Internal monitors are put in place by passing a thin, flexible wire through your vagina and into your uterus. Depending on the type of monitor, it may remain in your uterus or on your baby's head until birth. ? Your health care provider will discuss the benefits and risks of internal monitoring with you and will ask for your permission before inserting the monitors.  Telemetry. This is a type of continuous monitoring that can be done with external or internal monitors. Instead of having to stay in bed, you are able to move around during telemetry. Ask your health care provider if telemetry is an option for you.  Physical exam Your health care provider may perform a physical exam. This may include:  Checking whether your baby is positioned: ? With the head toward your vagina (head-down). This is most common. ? With the head toward the top of your uterus (head-up or breech). If your baby is in a breech position, your health care provider may try to turn your baby to a head-down position so you can deliver vaginally. If it does not seem that your baby can be born vaginally, your provider may recommend surgery to deliver your baby. In rare cases, you may be able to deliver vaginally if your baby is head-up (breech delivery). ? Lying sideways (transverse). Babies that are lying sideways cannot be delivered vaginally.  Checking your cervix to determine: ? Whether it is thinning out (effacing). ? Whether it is opening up (dilating). ? How low your baby has moved into your birth canal.  What are the three stages of labor and delivery?  Normal labor and delivery is divided into the following three stages: Stage 1  Stage 1 is the  longest stage of labor, and it can last for hours or days. Stage 1 includes: ? Early labor. This is when contractions may be irregular, or regular and mild. Generally, early labor contractions are more than 10 minutes apart. ? Active labor. This is when contractions get longer, more regular, more frequent, and more intense. ? The transition phase. This is when contractions happen very close together, are very intense, and may last longer than during any other part of labor.  Contractions generally feel mild, infrequent, and irregular at first. They get stronger, more frequent (about every 2-3 minutes), and more regular as you progress from early labor through active labor and transition.  Many women progress through stage 1 naturally, but you may need help to continue making progress. If this happens, your health care provider may talk with you about: ? Rupturing your amniotic sac if it has not ruptured yet. ? Giving you medicine to help make your contractions stronger and more frequent.  Stage 1 ends when your cervix is completely dilated to 4 inches (10 cm) and completely effaced. This happens at the end of the transition phase. Stage 2  Once   your cervix is completely effaced and dilated to 4 inches (10 cm), you may start to feel an urge to push. It is common for the body to naturally take a rest before feeling the urge to push, especially if you received an epidural or certain other pain medicines. This rest period may last for up to 1-2 hours, depending on your unique labor experience.  During stage 2, contractions are generally less painful, because pushing helps relieve contraction pain. Instead of contraction pain, you may feel stretching and burning pain, especially when the widest part of your baby's head passes through the vaginal opening (crowning).  Your health care provider will closely monitor your pushing progress and your baby's progress through the vagina during stage 2.  Your  health care provider may massage the area of skin between your vaginal opening and anus (perineum) or apply warm compresses to your perineum. This helps it stretch as the baby's head starts to crown, which can help prevent perineal tearing. ? In some cases, an incision may be made in your perineum (episiotomy) to allow the baby to pass through the vaginal opening. An episiotomy helps to make the opening of the vagina larger to allow more room for the baby to fit through.  It is very important to breathe and focus so your health care provider can control the delivery of your baby's head. Your health care provider may have you decrease the intensity of your pushing, to help prevent perineal tearing.  After delivery of your baby's head, the shoulders and the rest of the body generally deliver very quickly and without difficulty.  Once your baby is delivered, the umbilical cord may be cut right away, or this may be delayed for 1-2 minutes, depending on your baby's health. This may vary among health care providers, hospitals, and birth centers.  If you and your baby are healthy enough, your baby may be placed on your chest or abdomen to help maintain the baby's temperature and to help you bond with each other. Some mothers and babies start breastfeeding at this time. Your health care team will dry your baby and help keep your baby warm during this time.  Your baby may need immediate care if he or she: ? Showed signs of distress during labor. ? Has a medical condition. ? Was born too early (prematurely). ? Had a bowel movement before birth (meconium). ? Shows signs of difficulty transitioning from being inside the uterus to being outside of the uterus. If you are planning to breastfeed, your health care team will help you begin a feeding. Stage 3  The third stage of labor starts immediately after the birth of your baby and ends after you deliver the placenta. The placenta is an organ that develops  during pregnancy to provide oxygen and nutrients to your baby in the womb.  Delivering the placenta may require some pushing, and you may have mild contractions. Breastfeeding can stimulate contractions to help you deliver the placenta.  After the placenta is delivered, your uterus should tighten (contract) and become firm. This helps to stop bleeding in your uterus. To help your uterus contract and to control bleeding, your health care provider may: ? Give you medicine by injection, through an IV tube, by mouth, or through your rectum (rectally). ? Massage your abdomen or perform a vaginal exam to remove any blood clots that are left in your uterus. ? Empty your bladder by placing a thin, flexible tube (catheter) into your bladder. ? Encourage   you to breastfeed your baby. After labor is over, you and your baby will be monitored closely to ensure that you are both healthy until you are ready to go home. Your health care team will teach you how to care for yourself and your baby. This information is not intended to replace advice given to you by your health care provider. Make sure you discuss any questions you have with your health care provider. Document Released: 05/13/2008 Document Revised: 02/22/2016 Document Reviewed: 08/19/2015 Elsevier Interactive Patient Education  2018 [employer]  

## 2018-05-21 NOTE — Progress Notes (Signed)
ROB-Doing well, ready for baby. Request SVE, unchanged since previous visit. Herbal prep handout given. Anticipatory guidance regarding course of prenatal care. Reviewed red flag symptoms and when to call. RTC x 1 week for ROB or sooner if needed.

## 2018-05-23 ENCOUNTER — Observation Stay
Admission: EM | Admit: 2018-05-23 | Discharge: 2018-05-24 | Disposition: A | Discharge status: Home / Self Care | Payer: BC Managed Care – PPO | Attending: Certified Nurse Midwife | Admitting: Certified Nurse Midwife

## 2018-05-23 DIAGNOSIS — O368130 Decreased fetal movements, third trimester, not applicable or unspecified: Secondary | ICD-10-CM | POA: Insufficient documentation

## 2018-05-23 DIAGNOSIS — Z3A38 38 weeks gestation of pregnancy: Secondary | ICD-10-CM | POA: Insufficient documentation

## 2018-05-23 DIAGNOSIS — Z041 Encounter for examination and observation following transport accident: Principal | ICD-10-CM | POA: Insufficient documentation
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
## 2018-05-24 ENCOUNTER — Other Ambulatory Visit: Payer: Self-pay

## 2018-05-24 DIAGNOSIS — Z3A38 38 weeks gestation of pregnancy: Secondary | ICD-10-CM

## 2018-05-24 DIAGNOSIS — Y92410 Unspecified street and highway as the place of occurrence of the external cause: Secondary | ICD-10-CM

## 2018-05-24 DIAGNOSIS — Z041 Encounter for examination and observation following transport accident: Secondary | ICD-10-CM | POA: Diagnosis not present

## 2018-05-24 DIAGNOSIS — O9A213 Injury, poisoning and certain other consequences of external causes complicating pregnancy, third trimester: Secondary | ICD-10-CM

## 2018-05-24 DIAGNOSIS — O368130 Decreased fetal movements, third trimester, not applicable or unspecified: Secondary | ICD-10-CM
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
## 2018-05-28 ENCOUNTER — Inpatient Hospital Stay
Admission: EM | Admit: 2018-05-28 | Discharge: 2018-05-30 | DRG: 807 | Disposition: A | Discharge status: Home / Self Care | Payer: BC Managed Care – PPO | Attending: Certified Nurse Midwife | Admitting: Certified Nurse Midwife

## 2018-05-28 ENCOUNTER — Ambulatory Visit (INDEPENDENT_AMBULATORY_CARE_PROVIDER_SITE_OTHER): Payer: BC Managed Care – PPO | Admitting: Certified Nurse Midwife

## 2018-05-28 ENCOUNTER — Other Ambulatory Visit: Payer: Self-pay

## 2018-05-28 ENCOUNTER — Inpatient Hospital Stay: Payer: BC Managed Care – PPO | Admitting: Anesthesiology

## 2018-05-28 VITALS — BP 129/83 | HR 86

## 2018-05-28 DIAGNOSIS — Z3A39 39 weeks gestation of pregnancy: Secondary | ICD-10-CM | POA: Diagnosis not present

## 2018-05-28 DIAGNOSIS — Z3483 Encounter for supervision of other normal pregnancy, third trimester: Secondary | ICD-10-CM | POA: Diagnosis present

## 2018-05-28 DIAGNOSIS — O99334 Smoking (tobacco) complicating childbirth: Principal | ICD-10-CM | POA: Diagnosis present

## 2018-05-28 DIAGNOSIS — Z3493 Encounter for supervision of normal pregnancy, unspecified, third trimester: Secondary | ICD-10-CM

## 2018-05-28 DIAGNOSIS — F17210 Nicotine dependence, cigarettes, uncomplicated: Secondary | ICD-10-CM | POA: Diagnosis present

## 2018-05-28 DIAGNOSIS — O4202 Full-term premature rupture of membranes, onset of labor within 24 hours of rupture: Secondary | ICD-10-CM | POA: Diagnosis not present

## 2018-05-28 LAB — CBC
HCT: 33.7 % — ABNORMAL LOW (ref 36.0–46.0)
Hemoglobin: 11.6 g/dL — ABNORMAL LOW (ref 12.0–15.0)
MCH: 33.3 pg (ref 26.0–34.0)
MCHC: 34.4 g/dL (ref 30.0–36.0)
MCV: 96.8 fL (ref 80.0–100.0)
Platelets: 376 10*3/uL (ref 150–400)
RBC: 3.48 MIL/uL — ABNORMAL LOW (ref 3.87–5.11)
RDW: 13.2 % (ref 11.5–15.5)
WBC: 12.7 10*3/uL — ABNORMAL HIGH (ref 4.0–10.5)
nRBC: 0 % (ref 0.0–0.2)

## 2018-05-28 LAB — TYPE AND SCREEN
ABO/RH(D): A POS
Antibody Screen: NEGATIVE

## 2018-05-28 MED ORDER — FENTANYL 2.5 MCG/ML W/ROPIVACAINE 0.15% IN NS 100 ML EPIDURAL (ARMC)
EPIDURAL | Status: AC
  Filled 2018-05-28: qty 100

## 2018-05-28 MED ORDER — TERBUTALINE SULFATE 1 MG/ML IJ SOLN: 0.2500 mg | Freq: Once | INTRAMUSCULAR | Status: DC | PRN

## 2018-05-28 MED ORDER — EPHEDRINE 5 MG/ML INJ
10.0000 mg | INTRAVENOUS | Status: DC | PRN
  Filled 2018-05-28: qty 2

## 2018-05-28 MED ORDER — OXYTOCIN 40 UNITS IN LACTATED RINGERS INFUSION - SIMPLE MED
2.5000 [IU]/h | INTRAVENOUS | Status: DC
  Administered 2018-05-29: 2.5 [IU]/h via INTRAVENOUS
  Filled 2018-05-28: qty 1000

## 2018-05-28 MED ORDER — LIDOCAINE HCL (PF) 1 % IJ SOLN
INTRAMUSCULAR | Status: AC
  Filled 2018-05-28: qty 30

## 2018-05-28 MED ORDER — BUPIVACAINE HCL (PF) 0.25 % IJ SOLN
INTRAMUSCULAR | Status: DC | PRN
  Administered 2018-05-28: 5 mL via EPIDURAL

## 2018-05-28 MED ORDER — MISOPROSTOL 25 MCG QUARTER TABLET
50.0000 ug | ORAL_TABLET | ORAL | Status: DC
  Administered 2018-05-28: 50 ug via ORAL
  Filled 2018-05-28: qty 1

## 2018-05-28 MED ORDER — ACETAMINOPHEN 325 MG PO TABS: 650.0000 mg | ORAL_TABLET | ORAL | Status: DC | PRN

## 2018-05-28 MED ORDER — FENTANYL 2.5 MCG/ML W/ROPIVACAINE 0.15% IN NS 100 ML EPIDURAL (ARMC)
EPIDURAL | Status: DC | PRN
  Administered 2018-05-28: 12 mL/h via EPIDURAL

## 2018-05-28 MED ORDER — AMMONIA AROMATIC IN INHA
RESPIRATORY_TRACT | Status: AC
  Filled 2018-05-28: qty 10

## 2018-05-28 MED ORDER — PHENYLEPHRINE 40 MCG/ML (10ML) SYRINGE FOR IV PUSH (FOR BLOOD PRESSURE SUPPORT)
80.0000 ug | PREFILLED_SYRINGE | INTRAVENOUS | Status: DC | PRN
  Filled 2018-05-28: qty 5

## 2018-05-28 MED ORDER — LIDOCAINE HCL (PF) 1 % IJ SOLN: 30.0000 mL | INTRAMUSCULAR | Status: DC | PRN

## 2018-05-28 MED ORDER — OXYTOCIN 40 UNITS IN LACTATED RINGERS INFUSION - SIMPLE MED
1.0000 m[IU]/min | INTRAVENOUS | Status: DC
  Administered 2018-05-28: 2 m[IU]/min via INTRAVENOUS
  Filled 2018-05-28: qty 1000

## 2018-05-28 MED ORDER — DIPHENHYDRAMINE HCL 50 MG/ML IJ SOLN: 12.5000 mg | INTRAMUSCULAR | Status: DC | PRN

## 2018-05-28 MED ORDER — ONDANSETRON HCL 4 MG/2ML IJ SOLN
4.0000 mg | Freq: Four times a day (QID) | INTRAMUSCULAR | Status: DC | PRN
  Administered 2018-05-29: 4 mg via INTRAVENOUS
  Filled 2018-05-28: qty 2

## 2018-05-28 MED ORDER — BUTORPHANOL TARTRATE 1 MG/ML IJ SOLN
1.0000 mg | INTRAMUSCULAR | Status: DC | PRN
  Administered 2018-05-28 (×2): 1 mg via INTRAVENOUS
  Filled 2018-05-28 (×2): qty 1

## 2018-05-28 MED ORDER — LACTATED RINGERS IV SOLN: 500.0000 mL | INTRAVENOUS | Status: DC | PRN

## 2018-05-28 MED ORDER — MISOPROSTOL 200 MCG PO TABS
ORAL_TABLET | ORAL | Status: AC
  Filled 2018-05-28: qty 4

## 2018-05-28 MED ORDER — LACTATED RINGERS IV SOLN
INTRAVENOUS | Status: DC
  Administered 2018-05-28 (×3): via INTRAVENOUS

## 2018-05-28 MED ORDER — OXYTOCIN 10 UNIT/ML IJ SOLN: 10.0000 [IU] | Freq: Once | INTRAMUSCULAR | Status: DC

## 2018-05-28 MED ORDER — SOD CITRATE-CITRIC ACID 500-334 MG/5ML PO SOLN: 30.0000 mL | ORAL | Status: DC | PRN

## 2018-05-28 MED ORDER — LACTATED RINGERS IV SOLN: 500.0000 mL | Freq: Once | INTRAVENOUS | Status: DC

## 2018-05-28 MED ORDER — OXYTOCIN 10 UNIT/ML IJ SOLN
INTRAMUSCULAR | Status: AC
  Filled 2018-05-28: qty 2

## 2018-05-28 MED ORDER — FENTANYL 2.5 MCG/ML W/ROPIVACAINE 0.15% IN NS 100 ML EPIDURAL (ARMC): 12.0000 mL/h | EPIDURAL | Status: DC

## 2018-05-28 MED ORDER — OXYTOCIN BOLUS FROM INFUSION
500.0000 mL | Freq: Once | INTRAVENOUS | Status: AC
  Administered 2018-05-29: 500 mL via INTRAVENOUS

## 2018-05-28 NOTE — Anesthesia Preprocedure Evaluation (Signed)
Anesthesia Evaluation  Patient identified by MRN, date of birth, ID band Patient awake    Reviewed: Allergy & Precautions, NPO status , Patient's Chart, lab work & pertinent test results, reviewed documented beta blocker date and time   Airway Mallampati: III  TM Distance: >3 FB     Dental  (+) Chipped   Pulmonary Current Smoker,           Cardiovascular      Neuro/Psych PSYCHIATRIC DISORDERS Anxiety Depression    GI/Hepatic   Endo/Other    Renal/GU      Musculoskeletal   Abdominal   Peds  Hematology   Anesthesia Other Findings Obese.  Reproductive/Obstetrics                             Anesthesia Physical Anesthesia Plan  ASA: III  Anesthesia Plan: Epidural   Post-op Pain Management:    Induction:   PONV Risk Score and Plan:   Airway Management Planned:   Additional Equipment:   Intra-op Plan:   Post-operative Plan:   Informed Consent: I have reviewed the patients History and Physical, chart, labs and discussed the procedure including the risks, benefits and alternatives for the proposed anesthesia with the patient or authorized representative who has indicated his/her understanding and acceptance.     Plan Discussed with: CRNA  Anesthesia Plan Comments:         Anesthesia Quick Evaluation

## 2018-05-28 NOTE — H&P (Signed)
Obstetric History and Physical  Krystal Blackwell is a 37 y.o. G3P2 with IUP at [redacted]w[redacted]d presenting with SROM at 1am today. Patient states she has been having  Irregular,( in no real pattern) contractions, none vaginal bleeding, ruptured, clear fluid membranes, with active fetal movement.    Prenatal Course Source of Care: [facility]  Pregnancy complications or risks:none  Prenatal labs and studies: ABO, Rh: --/--/A POS (10/11 1051) Antibody: NEG (10/11 1051) Rubella: 1.80 (07/25 0944) RPR: Non Reactive (07/25 0944)  HBsAg: Negative (03/28 1649)  HIV: Non Reactive (03/28 1649)  XLK:GMWNUUVO (09/10 1619) 1 hr Glucola  normal Genetic screening normal Anatomy US normal  Past Medical History:  Diagnosis Date  . Blood transfusion without reported diagnosis    5366,4403  . Depression   . Diverticulitis   . Diverticulosis   . Ovarian cyst   . Pelvic pain in female   . Recurrent sinus infections   . Scoliosis     Past Surgical History:  Procedure Laterality Date  . NASAL SEPTUM SURGERY  11/2013  . SPINE SURGERY  97,98,99,00   scoliosis    OB History  Gravida Para Term Preterm AB Living  3 2          SAB TAB Ectopic Multiple Live Births               # Outcome Date GA Lbr Len/2nd Weight Sex Delivery Anes PTL Lv  3 Current           2 Para           1 Para             Social History   Socioeconomic History  . Marital status: Legally Separated    Spouse name: Not on file  . Number of children: 2  . Years of education: Not on file  . Highest education level: Not on file  Occupational History  . Occupation: EC Teacher  Social Needs  . Financial resource strain: Not on file  . Food insecurity:    Worry: Not on file    Inability: Not on file  . Transportation needs:    Medical: Not on file    Non-medical: Not on file  Tobacco Use  . Smoking status: Current Some Day Smoker  . Smokeless tobacco: Never Used  . Tobacco comment: 2-3 CIGARETTES EVERY TWO WEEKS  Substance and  Sexual Activity  . Alcohol use: Not Currently    Alcohol/week: 6.0 standard drinks    Types: 6 Cans of beer per week    Comment: SOCIALLY  . Drug use: No  . Sexual activity: Yes    Birth control/protection: None, Other-see comments    Comment: Nuvaring  Lifestyle  . Physical activity:    Days per week: Not on file    Minutes per session: Not on file  . Stress: Not on file  Relationships  . Social connections:    Talks on phone: Not on file    Gets together: Not on file    Attends religious service: Not on file    Active member of club or organization: Not on file    Attends meetings of clubs or organizations: Not on file    Relationship status: Not on file  Other Topics Concern  . Not on file  Social History Narrative   Entered 06/2014:   Has 2 children--both are boys--one is 51 y/o. The other is "teenager"--not sure exact age.   At home --it is her and  her 2 sons.   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    Family History  Problem Relation Age of Onset  . Asthma Mother   . Hypertension Mother   . Miscarriages / [place] Mother   . Alcohol abuse Father   . Drug abuse Father   . Pancreatic cancer Father   . Arthritis Maternal Grandmother   . Colon cancer Neg Hx   . Stomach cancer Neg Hx   . Esophageal cancer Neg Hx   . Rectal cancer Neg Hx   . Liver cancer Neg Hx     Medications Prior to Admission  Medication Sig Dispense Refill Last Dose  . Prenatal Vit-Fe Fumarate-FA (PRENATAL MULTIVITAMIN) TABS tablet Take 1 tablet by mouth daily at 12 noon.   05/27/2018 at Unknown time  . Probiotic Product (SOLUBLE FIBER/PROBIOTICS PO) Take 1 tablet by mouth 1 day or 1 dose.   05/27/2018 at Unknown time    Allergies  Allergen Reactions  . Avelox [Moxifloxacin Hcl In Nacl]     Review of Systems: Negative except for what is mentioned in HPI.  Physical Exam: BP 128/71 (BP Location: Right Arm)   Pulse 89   Temp 98.1 F (36.7 C) (Oral)    Resp 18   Ht 5\' 1"  (1.549 m)   Wt 80.3 kg   LMP 08/26/2017 Comment: unsure of LMP  BMI 33.44 kg/m  GENERAL: Well-developed, well-nourished female in no acute distress.  LUNGS: Clear to auscultation bilaterally.  HEART: Regular rate and rhythm. ABDOMEN: Soft, nontender, nondistended, gravid. EXTREMITIES: Nontender, no edema, 2+ distal pulses. Cervical Exam: Dilation: 2 Effacement (%): 50 Exam by:: Kyomi Hector CNM FHT:  Baseline rate 135 bpm   Variability moderate  Accelerations present   Decelerations none Contractions: Every 2-4 mins, mild to palpation   Pertinent Labs/Studies:   Results for orders placed or performed during the hospital encounter of 05/28/18 (from the past 24 hour(s))  CBC     Status: Abnormal   Collection Time: 05/28/18 10:09 AM  Result Value Ref Range   WBC 12.7 (H) 4.0 - 10.5 K/uL   RBC 3.48 (L) 3.87 - 5.11 MIL/uL   Hemoglobin 11.6 (L) 12.0 - 15.0 g/dL   HCT 33.7 (L) 36.0 - 46.0 %   MCV 96.8 80.0 - 100.0 fL   MCH 33.3 26.0 - 34.0 pg   MCHC 34.4 30.0 - 36.0 g/dL   RDW 13.2 11.5 - 15.5 %   Platelets 376 150 - 400 K/uL   nRBC 0.0 0.0 - 0.2 %  Type and screen     Status: None   Collection Time: 05/28/18 10:51 AM  Result Value Ref Range   ABO/RH(D) A POS    Antibody Screen NEG    Sample Expiration      05/31/2018 Performed at [facility], [address], [place], [place] [address]     Assessment : Krystal Blackwell is a 37 y.o. G3P2 at [redacted]w[redacted]d being admitted for labor.  Plan: Labor: Expectant management.Augmentation as needed with pitocin, per protocol FWB: Reassuring fetal heart tracing.  GBS negative Delivery plan: Hopeful for vaginal delivery  Anushka Hartinger CNM [facility], [facility]

## 2018-05-28 NOTE — Progress Notes (Signed)
ROB, pt state she had leaking of fluid since 1 am . IT is clear. She feels good movement and is having irregular contractions q 5-10 min. Nitrazine positive, copious amount of clear fluid leaking. PT sent to hospital for SROM.   Philip Aspen, CNM

## 2018-05-28 NOTE — Progress Notes (Signed)
Krystal Blackwell is a 37 y.o. G3P2 at [redacted]w[redacted]d by LMP admitted for rupture of membranes  Subjective: Reports pressure at tailbone, but that pain is less since epidural in place  Objective: BP (!) 145/77   Pulse 82   Temp 97.8 F (36.6 C) (Oral)   Resp 20   Ht 5\' 1"  (1.549 m)   Wt 80.3 kg   LMP 08/26/2017 Comment: unsure of LMP  SpO2 100%   BMI 33.44 kg/m  No intake/output data recorded. No intake/output data recorded.  FHT:  FHR: 138 bpm, variability: moderate,  accelerations:  Present,  decelerations:  Absent UC:   irregular, every 4-6 minutes SVE:   Dilation: 7.5 Effacement (%): 80 Station: -1 Exam by:: Melody, CNM ROP position  Labs: Lab Results  Component Value Date   WBC 12.7 (H) 05/28/2018   HGB 11.6 (L) 05/28/2018   HCT 33.7 (L) 05/28/2018   MCV 96.8 05/28/2018   PLT 376 05/28/2018    Assessment / Plan: a ctive labor- will restart pitocin augmentation and try left lying with peanut ball to facilitate fetal rotation  Labor: Progressing normally Preeclampsia:  labs stable Fetal Wellbeing:  Category I Pain Control:  Epidural I/D:  n/a Anticipated MOD:  NSVD  [name] 05/28/2018, 8:57 PM

## 2018-05-28 NOTE — Anesthesia Procedure Notes (Signed)
Epidural Patient location during procedure: OB  Staffing Anesthesiologist: Armstrong Creasy, MD Performed: anesthesiologist   Preanesthetic Checklist Completed: patient identified, site marked, surgical consent, pre-op evaluation, timeout performed, IV checked, risks and benefits discussed and monitors and equipment checked  Epidural Patient position: sitting Prep: ChloraPrep Patient monitoring: heart rate, continuous pulse ox and blood pressure Approach: midline Location: L4-L5 Injection technique: LOR saline  Needle:  Needle type: Tuohy  Needle gauge: 18 G Needle length: 9 cm and 9 Catheter type: closed end flexible Catheter size: 20 Guage Test dose: negative and 1.5% lidocaine with Epi 1:200 K  Assessment Sensory level: T10 Events: blood not aspirated, injection not painful, no injection resistance, negative IV test and no paresthesia  Additional Notes   Patient tolerated the insertion well without complications.Reason for block:procedure for pain     

## 2018-05-28 NOTE — Progress Notes (Signed)
Krystal Blackwell is a 37 y.o. G3P2 at [redacted]w[redacted]d by LMP admitted for rupture of membranes  Subjective: Reports increased pressure and request pain medication and or epidural  Objective: BP 136/81 (BP Location: Right Arm)   Pulse 79   Temp 98.1 F (36.7 C) (Oral)   Resp 18   Ht 5\' 1"  (1.549 m)   Wt 80.3 kg   LMP 08/26/2017 Comment: unsure of LMP  BMI 33.44 kg/m  No intake/output data recorded. No intake/output data recorded.  FHT:  FHR: 136 bpm, variability: moderate,  accelerations:  Present,  decelerations:  Present early and variable UC:   irregular, every 3-5 minutes SVE:   Dilation: 7.5 Effacement (%): 80 Station: -2 Exam by:: [employer] RN  Labs: Lab Results  Component Value Date   WBC 12.7 (H) 05/28/2018   HGB 11.6 (L) 05/28/2018   HCT 33.7 (L) 05/28/2018   MCV 96.8 05/28/2018   PLT 376 05/28/2018    Assessment / Plan: Augmentation of labor, progressing well  Labor: Progressing normally Preeclampsia:  labs stable Fetal Wellbeing:  Category I Pain Control:  IV pain meds I/D:  n/a Anticipated MOD:  NSVD  [name] 05/28/2018, 7:22 PM

## 2018-05-28 NOTE — Patient Instructions (Signed)
Braxton Hicks Contractions °Contractions of the uterus can occur throughout pregnancy, but they are not always a sign that you are in labor. You may have practice contractions called Braxton Hicks contractions. These false labor contractions are sometimes confused with true labor. °What are Braxton Hicks contractions? °Braxton Hicks contractions are tightening movements that occur in the muscles of the uterus before labor. Unlike true labor contractions, these contractions do not result in opening (dilation) and thinning of the cervix. Toward the end of pregnancy (32-34 weeks), Braxton Hicks contractions can happen more often and may become stronger. These contractions are sometimes difficult to tell apart from true labor because they can be very uncomfortable. You should not feel embarrassed if you go to the hospital with false labor. °Sometimes, the only way to tell if you are in true labor is for your health care provider to look for changes in the cervix. The health care provider will do a physical exam and may monitor your contractions. If you are not in true labor, the exam should show that your cervix is not dilating and your water has not broken. °If there are other health problems associated with your pregnancy, it is completely safe for you to be sent home with false labor. You may continue to have Braxton Hicks contractions until you go into true labor. °How to tell the difference between true labor and false labor °True labor °· Contractions last 30-70 seconds. °· Contractions become very regular. °· Discomfort is usually felt in the top of the uterus, and it spreads to the lower abdomen and low back. °· Contractions do not go away with walking. °· Contractions usually become more intense and increase in frequency. °· The cervix dilates and gets thinner. °False labor °· Contractions are usually shorter and not as strong as true labor contractions. °· Contractions are usually irregular. °· Contractions  are often felt in the front of the lower abdomen and in the groin. °· Contractions may go away when you walk around or change positions while lying down. °· Contractions get weaker and are shorter-lasting as time goes on. °· The cervix usually does not dilate or become thin. °Follow these instructions at home: °· Take over-the-counter and prescription medicines only as told by your health care provider. °· Keep up with your usual exercises and follow other instructions from your health care provider. °· Eat and drink lightly if you think you are going into labor. °· If Braxton Hicks contractions are making you uncomfortable: °? Change your position from lying down or resting to walking, or change from walking to resting. °? Sit and rest in a tub of warm water. °? Drink enough fluid to keep your urine pale yellow. Dehydration may cause these contractions. °? Do slow and deep breathing several times an hour. °· Keep all follow-up prenatal visits as told by your health care provider. This is important. °Contact a health care provider if: °· You have a fever. °· You have continuous pain in your abdomen. °Get help right away if: °· Your contractions become stronger, more regular, and closer together. °· You have fluid leaking or gushing from your vagina. °· You pass blood-tinged mucus (bloody show). °· You have bleeding from your vagina. °· You have low back pain that you never had before. °· You feel your baby’s head pushing down and causing pelvic pressure. °· Your baby is not moving inside you as much as it used to. °Summary °· Contractions that occur before labor are called Braxton   Hicks contractions, false labor, or practice contractions. °· Braxton Hicks contractions are usually shorter, weaker, farther apart, and less regular than true labor contractions. True labor contractions usually become progressively stronger and regular and they become more frequent. °· Manage discomfort from Braxton Hicks contractions by  changing position, resting in a warm bath, drinking plenty of water, or practicing deep breathing. °This information is not intended to replace advice given to you by your health care provider. Make sure you discuss any questions you have with your health care provider. °Document Released: 12/18/2016 Document Revised: 12/18/2016 Document Reviewed: 12/18/2016 °Elsevier Interactive Patient Education © 2018 Elsevier Inc. ° °

## 2018-05-29 DIAGNOSIS — Z3A39 39 weeks gestation of pregnancy: Secondary | ICD-10-CM

## 2018-05-29 DIAGNOSIS — O4202 Full-term premature rupture of membranes, onset of labor within 24 hours of rupture: Secondary | ICD-10-CM

## 2018-05-29 LAB — CBC
HCT: 32.6 % — ABNORMAL LOW (ref 36.0–46.0)
Hemoglobin: 11.1 g/dL — ABNORMAL LOW (ref 12.0–15.0)
MCH: 32.7 pg (ref 26.0–34.0)
MCHC: 34 g/dL (ref 30.0–36.0)
MCV: 96.2 fL (ref 80.0–100.0)
Platelets: 327 10*3/uL (ref 150–400)
RBC: 3.39 MIL/uL — ABNORMAL LOW (ref 3.87–5.11)
RDW: 12.9 % (ref 11.5–15.5)
WBC: 20.4 10*3/uL — ABNORMAL HIGH (ref 4.0–10.5)
nRBC: 0 % (ref 0.0–0.2)

## 2018-05-29 LAB — RPR: RPR Ser Ql: NONREACTIVE

## 2018-05-29 MED ORDER — DIPHENHYDRAMINE HCL 25 MG PO CAPS: 25.0000 mg | ORAL_CAPSULE | Freq: Four times a day (QID) | ORAL | Status: DC | PRN

## 2018-05-29 MED ORDER — OXYTOCIN 40 UNITS IN LACTATED RINGERS INFUSION - SIMPLE MED
INTRAVENOUS | Status: AC
  Filled 2018-05-29: qty 1000

## 2018-05-29 MED ORDER — SENNOSIDES-DOCUSATE SODIUM 8.6-50 MG PO TABS
2.0000 | ORAL_TABLET | ORAL | Status: DC
  Administered 2018-05-30: 2 via ORAL
  Filled 2018-05-29: qty 2

## 2018-05-29 MED ORDER — IBUPROFEN 600 MG PO TABS
600.0000 mg | ORAL_TABLET | Freq: Four times a day (QID) | ORAL | Status: DC
  Administered 2018-05-29 – 2018-05-30 (×6): 600 mg via ORAL
  Filled 2018-05-29 (×6): qty 1

## 2018-05-29 MED ORDER — WITCH HAZEL-GLYCERIN EX PADS
1.0000 "application " | MEDICATED_PAD | CUTANEOUS | Status: DC | PRN
  Administered 2018-05-29: 1 via TOPICAL
  Filled 2018-05-29: qty 100

## 2018-05-29 MED ORDER — ONDANSETRON HCL 4 MG/2ML IJ SOLN: 4.0000 mg | INTRAMUSCULAR | Status: DC | PRN

## 2018-05-29 MED ORDER — BENZOCAINE-MENTHOL 20-0.5 % EX AERO
1.0000 "application " | INHALATION_SPRAY | CUTANEOUS | Status: DC | PRN
  Administered 2018-05-29: 1 via TOPICAL
  Filled 2018-05-29: qty 56

## 2018-05-29 MED ORDER — ONDANSETRON HCL 4 MG PO TABS: 4.0000 mg | ORAL_TABLET | ORAL | Status: DC | PRN

## 2018-05-29 MED ORDER — SIMETHICONE 80 MG PO CHEW: 80.0000 mg | CHEWABLE_TABLET | ORAL | Status: DC | PRN

## 2018-05-29 MED ORDER — DIBUCAINE 1 % RE OINT
1.0000 "application " | TOPICAL_OINTMENT | RECTAL | Status: DC | PRN
  Administered 2018-05-29: 1 via RECTAL
  Filled 2018-05-29: qty 28

## 2018-05-29 MED ORDER — ACETAMINOPHEN 325 MG PO TABS
650.0000 mg | ORAL_TABLET | ORAL | Status: DC | PRN
  Administered 2018-05-29 – 2018-05-30 (×2): 650 mg via ORAL
  Filled 2018-05-29 (×2): qty 2

## 2018-05-29 MED ORDER — COCONUT OIL OIL
1.0000 "application " | TOPICAL_OIL | Status: DC | PRN
  Administered 2018-05-29: 1 via TOPICAL
  Filled 2018-05-29: qty 120

## 2018-05-29 MED ORDER — PRENATAL MULTIVITAMIN CH
1.0000 | ORAL_TABLET | Freq: Every day | ORAL | Status: DC
  Administered 2018-05-29 – 2018-05-30 (×2): 1 via ORAL
  Filled 2018-05-29 (×2): qty 1

## 2018-05-29 NOTE — Lactation Note (Signed)
This note was copied from a baby's chart. Lactation Consultation Note  Patient Name: Krystal Blackwell [id number] Date: 05/29/2018  Mom experienced breast feeder.  Mom reports breast feeding well.  Observed good breast feed with strong rhythmic sucking.  Reviewed supply and demand, normal course of lactation and routine newborn pattern.  Lactation name and number written on white board and encouraged to call with any questions, concerns or assistance.   Maternal Data    Feeding    LATCH Score                   Interventions    Lactation Tools Discussed/Used     Consult Status      Krystal Blackwell 05/29/2018, 6:03 PM

## 2018-05-29 NOTE — Progress Notes (Signed)
Breast pump given per pt request, reports she does not want an electronic pump, prefers manual. Lactation to round this AM. Breastfeeding is going well per pt, basics reviewed, coconut oil provided at bedside.

## 2018-05-30 ENCOUNTER — Ambulatory Visit: Payer: Self-pay

## 2018-05-30 NOTE — Anesthesia Postprocedure Evaluation (Signed)
Anesthesia Post Note  Patient: Krystal Blackwell  Procedure(s) Performed: AN AD HOC LABOR EPIDURAL  Patient location during evaluation: Mother Baby Anesthesia Type: Epidural Level of consciousness: awake and alert and oriented Pain management: pain level controlled Vital Signs Assessment: post-procedure vital signs reviewed and stable Respiratory status: spontaneous breathing Cardiovascular status: blood pressure returned to baseline Postop Assessment: no headache and no backache Anesthetic complications: no     Last Vitals:  Vitals:   05/29/18 2354 05/30/18 0739  BP: 120/77 123/72  Pulse: 75 70  Resp: 18 18  Temp: 36.9 C 36.9 C  SpO2:  97%    Last Pain:  Vitals:   05/30/18 0739  TempSrc: Oral  PainSc:                  Gaylon Bentz

## 2018-05-30 NOTE — Lactation Note (Signed)
This note was copied from a baby's chart. Lactation Consultation Note  Patient Name: Krystal Blackwell [id number] Date: 05/30/2018  Observed good breast feed.  Lactation community resources discussed.  Discharge questions answered.   Maternal Data    Feeding    LATCH Score                   Interventions    Lactation Tools Discussed/Used     Consult Status      Jarold Motto 05/30/2018, 7:05 PM

## 2018-05-30 NOTE — Progress Notes (Signed)
Reviewed D/C instructions with pt and family. Pt verbalized understanding of teaching. Discharged to home via W/C. Pt to schedule f/u appt.  

## 2018-05-30 NOTE — Discharge Summary (Signed)
Discharge Summary  Date of Admission: 05/28/2018  Date of Discharge: 05/30/2018  Admitting Diagnosis: SROM at [redacted]w[redacted]d  Mode of Delivery: normal spontaneous vaginal delivery                 Discharge Diagnosis: No other diagnosis   Intrapartum Procedures: epidural   Post partum procedures: None  Complications: none                      Discharge Day SOAP Note:  Progress Note - Vaginal Delivery  Krystal Blackwell is a 37 y.o. G3P1001 now PP day 1 s/p Vaginal, Spontaneous . Delivery was uncomplicated  Subjective  The patient has the following complaints: has no unusual complaints  Pain is controlled with current medications.   Patient is urinating without difficulty.  She is ambulating well.     Objective  Vital signs: BP 123/72 (BP Location: Right Arm)   Pulse 70   Temp 98.5 F (36.9 C) (Oral)   Resp 18   Ht 5\' 1"  (1.549 m)   Wt 80.3 kg   LMP 08/26/2017 Comment: unsure of LMP  SpO2 97%   Breastfeeding? Unknown   BMI 33.44 kg/m   Physical Exam: Gen: NAD Fundus Fundal Tone: Firm @ U  Lochia Amount: Small  Perineum Appearance: Edematous, Intact     Data Review Labs: CBC Latest Ref Rng & Units 05/29/2018 05/28/2018 03/11/2018  WBC 4.0 - 10.5 K/uL 20.4(H) 12.7(H) 11.9(H)  Hemoglobin 12.0 - 15.0 g/dL 11.1(L) 11.6(L) 11.8  Hematocrit 36.0 - 46.0 % 32.6(L) 33.7(L) 36.0  Platelets 150 - 400 K/uL 327 376 409   A POS  Assessment/Plan  Active Problems:   Labor and delivery, indication for care    Plan for discharge today.   Discharge Instructions: Per After Visit Summary. Activity: Advance as tolerated. Pelvic rest for 6 weeks.  Also refer to After Visit Summary Diet: Regular Medications: Allergies as of 05/30/2018      Reactions   Avelox [moxifloxacin Hcl In Nacl]       Medication List    TAKE these medications   prenatal multivitamin Tabs tablet Take 1 tablet by mouth daily at 12 noon.   SOLUBLE FIBER/PROBIOTICS  PO Take 1 tablet by mouth 1 day or 1 dose.      Outpatient follow up: 6 weeks with Lorelle Gibbs, CNM  Postpartum contraception: She is undecided. Will discuss at first office visit post-partum  Discharged Condition: good  Discharged to: home  Newborn Data: Disposition:home with mother  Apgars: APGAR (1 MIN): 8   APGAR (5 MINS): 9   APGAR (10 MINS):    Baby Feeding: Breast    Philip Aspen, CNM  05/30/2018 10:30 AM

## 2018-05-30 NOTE — Final Progress Note (Signed)
Discharge Day SOAP Note:  Progress Note - Vaginal Delivery  Krystal Blackwell is a 37 y.o. G3P1001 now PP day 1 s/p Vaginal, Spontaneous . Delivery was uncomplicated  Subjective  The patient has the following complaints: has no unusual complaints  Pain is controlled with current medications.   Patient is urinating without difficulty.  She is ambulating well.     Objective  Vital signs: BP 123/72 (BP Location: Right Arm)   Pulse 70   Temp 98.5 F (36.9 C) (Oral)   Resp 18   Ht 5\' 1"  (1.549 m)   Wt 80.3 kg   LMP 08/26/2017 Comment: unsure of LMP  SpO2 97%   Breastfeeding? Unknown   BMI 33.44 kg/m   Physical Exam: Gen: NAD Fundus Fundal Tone: Firm @ U  Lochia Amount: Small  Perineum Appearance: Edematous, Intact     Data Review Labs: CBC Latest Ref Rng & Units 05/29/2018 05/28/2018 03/11/2018  WBC 4.0 - 10.5 K/uL 20.4(H) 12.7(H) 11.9(H)  Hemoglobin 12.0 - 15.0 g/dL 11.1(L) 11.6(L) 11.8  Hematocrit 36.0 - 46.0 % 32.6(L) 33.7(L) 36.0  Platelets 150 - 400 K/uL 327 376 409   A POS  Assessment/Plan  Active Problems:   Labor and delivery, indication for care    Plan for discharge today.   Discharge Instructions: Per After Visit Summary. Activity: Advance as tolerated. Pelvic rest for 6 weeks.  Also refer to After Visit Summary Diet: Regular Medications: Allergies as of 05/30/2018      Reactions   Avelox [moxifloxacin Hcl In Nacl]       Medication List    TAKE these medications   prenatal multivitamin Tabs tablet Take 1 tablet by mouth daily at 12 noon.   SOLUBLE FIBER/PROBIOTICS PO Take 1 tablet by mouth 1 day or 1 dose.      Outpatient follow up: 6 weeks with Lorelle Gibbs, CNM  Postpartum contraception: She is undecided. Will discuss at first office visit post-partum  Discharged Condition: good  Discharged to: home  Newborn Data: Disposition:home with mother  Apgars: APGAR (1 MIN): 8   APGAR (5 MINS): 9   APGAR (10 MINS):    Baby  Feeding: Breast    Philip Aspen, CNM  05/30/2018 10:30 AM

## 2018-06-01 ENCOUNTER — Telehealth: Payer: Self-pay | Admitting: Obstetrics and Gynecology

## 2018-06-01 NOTE — Telephone Encounter (Signed)
The patient called and asked if I could send a message back to [name] to check on the status of her paperwork that needs to be filled out. Pease advise.

## 2018-06-03 NOTE — Telephone Encounter (Signed)
Per Ivin Booty paperwork was put up front

## 2018-06-14 ENCOUNTER — Other Ambulatory Visit: Payer: Self-pay | Admitting: Obstetrics and Gynecology

## 2018-06-24 ENCOUNTER — Telehealth: Payer: Self-pay | Admitting: Obstetrics and Gynecology

## 2018-06-24 ENCOUNTER — Encounter: Payer: BC Managed Care – PPO | Admitting: Obstetrics and Gynecology

## 2018-06-24 NOTE — Telephone Encounter (Signed)
The patient called to check and see if she her paperwork that she turned in last week has been completed. The patient would like a call back to update her on the status of the paperwork if possible today. Please advise.

## 2018-06-25 NOTE — Telephone Encounter (Signed)
Notified pt paperwork is ready for pick up

## 2018-06-25 NOTE — Telephone Encounter (Signed)
The patient called and asked about the status of her paperwork as the deadline is Tuesday, Nov 12.  She is asking for a call back so she can pick it up, please advise, thanks.

## 2018-06-30 ENCOUNTER — Encounter: Payer: BC Managed Care – PPO | Admitting: Obstetrics and Gynecology

## 2018-07-02 ENCOUNTER — Encounter: Payer: BC Managed Care – PPO | Admitting: Obstetrics and Gynecology

## 2018-07-14 ENCOUNTER — Encounter: Payer: Self-pay | Admitting: Certified Nurse Midwife

## 2018-07-14 ENCOUNTER — Ambulatory Visit (INDEPENDENT_AMBULATORY_CARE_PROVIDER_SITE_OTHER): Payer: BC Managed Care – PPO | Admitting: Certified Nurse Midwife

## 2018-07-14 MED ORDER — CITALOPRAM HYDROBROMIDE 10 MG PO TABS: 10.0000 mg | ORAL_TABLET | Freq: Every day | ORAL | 0 refills | Status: DC

## 2018-07-14 MED ORDER — DROSPIRENONE 4 MG PO TABS: 1.0000 | ORAL_TABLET | Freq: Every day | ORAL | 3 refills | Status: DC

## 2018-07-14 NOTE — Progress Notes (Signed)
Subjective:    Krystal Blackwell is a 37 y.o. G10P1001 Caucasian female who presents for a postpartum visit. She is 6 weeks postpartum following a spontaneous vaginal delivery at 39.2 gestational weeks. Anesthesia: epidural. I have fully reviewed the prenatal and intrapartum course. Postpartum course has been WNL. Baby's course has been WNL. Baby is feeding by breast. Bleeding no bleeding. Bowel function is normal. Bladder function is normal. Patient is sexually active. Last sexual activity: a week ago Contraception method is condoms. Postpartum depression screening: minimal. Score 5. She state she is having issues with anger and mood and is requesting medication. She has been on wellbutrin in the past. Due to breastfeeding, recommend SSTI.   Last pap 11/2013 and was negative , HPV neg.   The following portions of the patient's history were reviewed and updated as appropriate: allergies, current medications, past medical history, past surgical history and problem list.  Review of Systems Pertinent items are noted in HPI.   There were no vitals filed for this visit. Patient's last menstrual period was 08/26/2017.  Objective:   General:  alert, cooperative and no distress   Breasts:  deferred, no complaints  Lungs: clear to auscultation bilaterally  Heart:  regular rate and rhythm  Abdomen: soft, nontender   Vulva: normal  Vagina: normal vagina  Cervix:  closed  Corpus: Well-involuted  Adnexa:  Non-palpable  Rectal Exam: no hemorrhoids        Assessment:   Postpartum exam 6 wks s/p SVD Breastfeeding Depression screening Contraception counseling   Plan:  : oral progesterone-only contraceptive Samples given order placed. Pt state she has done zoloft in the past and that it did not work for her. Celexa ordered. Reviewed benefits and risks of medications while breastfeeding. Pt recommended to notify her pediatrician of start of medication.  Follow up in: 4 weeks for medication check or  earlier if needed  Philip Aspen, CNM

## 2018-07-14 NOTE — Patient Instructions (Signed)
Preventive Care 18-39 Years, Female Preventive care refers to lifestyle choices and visits with your health care provider that can promote health and wellness. What does preventive care include?  A yearly physical exam. This is also called an annual well check.  Dental exams once or twice a year.  Routine eye exams. Ask your health care provider how often you should have your eyes checked.  Personal lifestyle choices, including: ? Daily care of your teeth and gums. ? Regular physical activity. ? Eating a healthy diet. ? Avoiding tobacco and drug use. ? Limiting alcohol use. ? Practicing safe sex. ? Taking vitamin and mineral supplements as recommended by your health care provider. What happens during an annual well check? The services and screenings done by your health care provider during your annual well check will depend on your age, overall health, lifestyle risk factors, and family history of disease. Counseling Your health care provider may ask you questions about your:  Alcohol use.  Tobacco use.  Drug use.  Emotional well-being.  Home and relationship well-being.  Sexual activity.  Eating habits.  Work and work Statistician.  Method of birth control.  Menstrual cycle.  Pregnancy history.  Screening You may have the following tests or measurements:  Height, weight, and BMI.  Diabetes screening. This is done by checking your blood sugar (glucose) after you have not eaten for a while (fasting).  Blood pressure.  Lipid and cholesterol levels. These may be checked every 5 years starting at age 43.  Skin check.  Hepatitis C blood test.  Hepatitis B blood test.  Sexually transmitted disease (STD) testing.  BRCA-related cancer screening. This may be done if you have a family history of breast, ovarian, tubal, or peritoneal cancers.  Pelvic exam and Pap test. This may be done every 3 years starting at age 57. Starting at age 30, this may be done every 5  years if you have a Pap test in combination with an HPV test.  Discuss your test results, treatment options, and if necessary, the need for more tests with your health care provider. Vaccines Your health care provider may recommend certain vaccines, such as:  Influenza vaccine. This is recommended every year.  Tetanus, diphtheria, and acellular pertussis (Tdap, Td) vaccine. You may need a Td booster every 10 years.  Varicella vaccine. You may need this if you have not been vaccinated.  HPV vaccine. If you are 25 or younger, you may need three doses over 6 months.  Measles, mumps, and rubella (MMR) vaccine. You may need at least one dose of MMR. You may also need a second dose.  Pneumococcal 13-valent conjugate (PCV13) vaccine. You may need this if you have certain conditions and were not previously vaccinated.  Pneumococcal polysaccharide (PPSV23) vaccine. You may need one or two doses if you smoke cigarettes or if you have certain conditions.  Meningococcal vaccine. One dose is recommended if you are age 47-21 years and a first-year college student living in a residence hall, or if you have one of several medical conditions. You may also need additional booster doses.  Hepatitis A vaccine. You may need this if you have certain conditions or if you travel or work in places where you may be exposed to hepatitis A.  Hepatitis B vaccine. You may need this if you have certain conditions or if you travel or work in places where you may be exposed to hepatitis B.  Haemophilus influenzae type b (Hib) vaccine. You may need this if  you have certain risk factors.  Talk to your health care provider about which screenings and vaccines you need and how often you need them. This information is not intended to replace advice given to you by your health care provider. Make sure you discuss any questions you have with your health care provider. Document Released: 09/30/2001 Document Revised: 04/23/2016  Document Reviewed: 06/05/2015 Elsevier Interactive Patient Education  [employer].

## 2018-07-28 ENCOUNTER — Telehealth: Payer: Self-pay | Admitting: Obstetrics and Gynecology

## 2018-07-28 NOTE — Telephone Encounter (Signed)
Patient started birth control 2 weeks ago. She has since started bleeding and her breast milk supply is going down, Is the milk supply related to the birth control.She would like a call back. Thanks

## 2018-07-30 ENCOUNTER — Other Ambulatory Visit: Payer: Self-pay | Admitting: *Deleted

## 2018-07-30 MED ORDER — METOCLOPRAMIDE HCL 10 MG PO TABS: 10.0000 mg | ORAL_TABLET | Freq: Three times a day (TID) | ORAL | 1 refills | Status: DC

## 2018-07-30 NOTE — Telephone Encounter (Signed)
pls advise

## 2018-07-30 NOTE — Telephone Encounter (Signed)
Sent pt reglan in , left her VM

## 2018-07-30 NOTE — Telephone Encounter (Signed)
She was started on the progestin only BC so it doesn't typically affect milk production.  Have her increase feedings for 24 hours and I can prescribe reglan for the next week as it should increase her production.

## 2018-08-17 ENCOUNTER — Telehealth: Payer: Self-pay | Admitting: Obstetrics and Gynecology

## 2018-08-17 NOTE — Telephone Encounter (Signed)
The patient called and stated that she would like to speak  With Melody or [name] in regards to some questions about her medication that she is currently taking for breast feeding has stopped working, and she would like to know some other options that may help before giving up. Please advise.

## 2018-08-17 NOTE — Telephone Encounter (Signed)
pls advise

## 2018-08-19 NOTE — Telephone Encounter (Signed)
Have her contact the lactation consultants at [facility], we can put in a referral if needed.

## 2018-09-10 ENCOUNTER — Other Ambulatory Visit: Payer: Self-pay | Admitting: *Deleted

## 2018-09-10 ENCOUNTER — Telehealth: Payer: Self-pay | Admitting: Obstetrics and Gynecology

## 2018-09-10 MED ORDER — METOCLOPRAMIDE HCL 10 MG PO TABS: 10.0000 mg | ORAL_TABLET | Freq: Three times a day (TID) | ORAL | 1 refills | Status: DC

## 2018-09-10 NOTE — Telephone Encounter (Signed)
Done-ac 

## 2018-09-10 NOTE — Telephone Encounter (Signed)
The patient called and stated that she needs a refill of her prescription metoCLOPramide (REGLAN) 10 MG tablet, The patient also stated that her pharmacy stated that she may get a refill faster if she directly contacts the office. No other information disclosed. Please advise.

## 2018-09-14 NOTE — Telephone Encounter (Signed)
The patient called today and asked about her refill of her Reglan, and after verifying in her chart, she was told it was sent in to [facility] on 09/10/2018, and received.  Patient was very Patent attorney.  *NO FURTHER ACTION REQUIRED*

## 2018-10-04 ENCOUNTER — Telehealth: Payer: Self-pay | Admitting: Obstetrics and Gynecology

## 2018-10-04 ENCOUNTER — Encounter: Payer: Self-pay | Admitting: *Deleted

## 2018-10-04 NOTE — Telephone Encounter (Signed)
The patient states when she went to pick up her new medication, the pharmacist told her she should not take the Celexa and Reglan together.  She is asking if it is ok, and what does she need to do.  Please advise, thanks.

## 2018-10-04 NOTE — Telephone Encounter (Signed)
Sent pt mychart message

## 2018-10-22 ENCOUNTER — Other Ambulatory Visit: Payer: Self-pay | Admitting: *Deleted

## 2018-10-22 MED ORDER — AZITHROMYCIN 250 MG PO TABS: 250.0000 mg | ORAL_TABLET | Freq: Every day | ORAL | 0 refills | Status: DC

## 2018-12-20 ENCOUNTER — Telehealth: Payer: Self-pay | Admitting: Obstetrics and Gynecology

## 2018-12-20 ENCOUNTER — Telehealth: Payer: Self-pay

## 2018-12-20 NOTE — Telephone Encounter (Signed)
The patient called and stated that she needs to speak with Melody or [name] in regards to trying breast feeding before her last attempt. Please advise.

## 2018-12-21 ENCOUNTER — Other Ambulatory Visit: Payer: Self-pay | Admitting: *Deleted

## 2018-12-21 ENCOUNTER — Encounter: Payer: Self-pay | Admitting: *Deleted

## 2018-12-21 MED ORDER — METOCLOPRAMIDE HCL 10 MG PO TABS: 10.0000 mg | ORAL_TABLET | Freq: Three times a day (TID) | ORAL | 1 refills | Status: DC

## 2018-12-21 NOTE — Telephone Encounter (Signed)
Sent pt mychart message

## 2018-12-22 NOTE — Telephone Encounter (Signed)
Se mychart message

## 2019-01-05 ENCOUNTER — Telehealth: Payer: Self-pay

## 2019-01-05 NOTE — Telephone Encounter (Signed)
Coronavirus (COVID-19) Are you at risk?  Are you at risk for the Coronavirus (COVID-19)?  To be considered HIGH RISK for Coronavirus (COVID-19), you have to meet the following criteria:  . Traveled to [place], [place], [place], [place] or [place]; or in the [place] to [place], [place], [place], or [place]; and have fever, cough, and shortness of breath within the last 2 weeks of travel OR . Been in close contact with a person diagnosed with COVID-19 within the last 2 weeks and have fever, cough, and shortness of breath . IF YOU DO NOT MEET THESE CRITERIA, YOU ARE CONSIDERED LOW RISK FOR COVID-19.  What to do if you are HIGH RISK for COVID-19?  . If you are having a medical emergency, call 911. . Seek medical care right away. Before you go to a doctor's office, urgent care or emergency department, call ahead and tell them about your recent travel, contact with someone diagnosed with COVID-19, and your symptoms. You should receive instructions from your physician's office regarding next steps of care.  . When you arrive at healthcare provider, tell the healthcare staff immediately you have returned from visiting [place], [place], [place], [place] or [place]; or traveled in the [place] to [place], [place], [place], or [place]; in the last two weeks or you have been in close contact with a person diagnosed with COVID-19 in the last 2 weeks.   . Tell the health care staff about your symptoms: fever, cough and shortness of breath. . After you have been seen by a medical provider, you will be either: o Tested for (COVID-19) and discharged home on quarantine except to seek medical care if symptoms worsen, and asked to  - Stay home and avoid contact with others until you get your results (4-5 days)  - Avoid travel on public transportation if possible (such as bus, train, or airplane) or o Sent to the Emergency Department by EMS for evaluation, COVID-19 testing, and possible  admission depending on your condition and test results.  What to do if you are LOW RISK for COVID-19?  Reduce your risk of any infection by using the same precautions used for avoiding the common cold or flu:  . Wash your hands often with soap and warm water for at least 20 seconds.  If soap and water are not readily available, use an alcohol-based hand sanitizer with at least 60% alcohol.  . If coughing or sneezing, cover your mouth and nose by coughing or sneezing into the elbow areas of your shirt or coat, into a tissue or into your sleeve (not your hands). . Avoid shaking hands with others and consider head nods or verbal greetings only. . Avoid touching your eyes, nose, or mouth with unwashed hands.  . Avoid close contact with people who are sick. . Avoid places or events with large numbers of people in one location, like concerts or sporting events. . Carefully consider travel plans you have or are making. . If you are planning any travel outside or inside the [place], visit the CDC's Travelers' Health webpage for the latest health notices. . If you have some symptoms but not all symptoms, continue to monitor at home and seek medical attention if your symptoms worsen. . If you are having a medical emergency, call 911.   ADDITIONAL HEALTHCARE OPTIONS FOR PATIENTS  [facility] / e-Visit: [email or web address]         [facility]: [phone]  [facility]: [phone]                   [facility]: [phone]   Pre-screen negative, DM.   

## 2019-01-06 ENCOUNTER — Other Ambulatory Visit: Payer: Self-pay

## 2019-01-06 ENCOUNTER — Encounter: Payer: Self-pay | Admitting: Obstetrics and Gynecology

## 2019-01-06 ENCOUNTER — Ambulatory Visit (INDEPENDENT_AMBULATORY_CARE_PROVIDER_SITE_OTHER): Payer: BC Managed Care – PPO | Admitting: Obstetrics and Gynecology

## 2019-01-06 VITALS — BP 119/75 | HR 78 | Ht 61.0 in | Wt 163.6 lb

## 2019-01-06 DIAGNOSIS — F32A Depression, unspecified: Secondary | ICD-10-CM

## 2019-01-06 DIAGNOSIS — F329 Major depressive disorder, single episode, unspecified: Secondary | ICD-10-CM

## 2019-01-06 DIAGNOSIS — Z3009 Encounter for other general counseling and advice on contraception: Secondary | ICD-10-CM

## 2019-01-06 MED ORDER — BUPROPION HCL ER (SR) 150 MG PO TB12: 150.0000 mg | ORAL_TABLET | Freq: Two times a day (BID) | ORAL | 6 refills | Status: DC

## 2019-01-06 MED ORDER — ETONOGESTREL-ETHINYL ESTRADIOL 0.12-0.015 MG/24HR VA RING: VAGINAL_RING | VAGINAL | 12 refills | Status: DC

## 2019-01-06 MED ORDER — CITALOPRAM HYDROBROMIDE 10 MG PO TABS: 10.0000 mg | ORAL_TABLET | Freq: Every day | ORAL | 0 refills | Status: DC

## 2019-01-06 NOTE — Progress Notes (Signed)
  Subjective:     Patient ID: Krystal Blackwell, female   DOB: 04-13-1981, 38 y.o.   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  So has started and stopped a few times.Hasn't taken SSRI in the last month. Feels like she needs to be back on it. states she feels happy but is very irritable and anxious, exacerbated by the pandemic and working from home.   Also BCP has not been consistent in taking it. Menses are irregular too. Desires to go back on the Nuvaring.  Feels like emotions are all over the place.   Depression screen [facility] 2/9 01/06/2019 07/14/2018 03/12/2017 10/30/2016  Decreased Interest 3 1 3  0  Down, Depressed, Hopeless 3 1 3  0  PHQ - 2 Score 6 2 6  0  Altered sleeping 3 0 3 0  Tired, decreased energy 3 0 3 0  Change in appetite 3 0 3 0  Feeling bad or failure about yourself  1 1 0 0  Trouble concentrating 1 1 2  0  Moving slowly or fidgety/restless 1 1 1  0  Suicidal thoughts 1 0 0 0  PHQ-9 Score 19 5 18  0  Difficult doing work/chores Somewhat difficult - - Not difficult at all   Review of Systems  Constitutional: Positive for fatigue.  Genitourinary: Positive for menstrual problem.  Psychiatric/Behavioral: The patient is nervous/anxious.   All other systems reviewed and are negative.      Objective:   Physical Exam A&Ox4 Well groomed female in no distress Blood pressure 119/75, pulse 78, height 5\' 1"  (1.549 m), weight 163 lb 9.6 oz (74.2 kg), currently breastfeeding. Psychiatric Specialty Exam: Physical Exam  Review of Systems  Constitutional: Positive for fatigue.  Genitourinary: Positive for menstrual problem.  Psychiatric/Behavioral: The patient is nervous/anxious.   All other systems reviewed and are negative.   Blood pressure 119/75, pulse 78, height 5\' 1"  (1.549 m), weight 163 lb 9.6 oz (74.2 kg), currently breastfeeding.Body mass index is 30.91 kg/m.   General Appearance: Neat and Well Groomed  Eye Contact:  Good  Speech:  Clear and Coherent  Volume:  Normal  Mood:  Negative  Affect:  Appropriate, Blunt and Congruent  Thought Process:  Coherent  Orientation:  Full (Time, Place, and Person)  Thought Content:  Logical  Suicidal Thoughts:  No  Homicidal Thoughts:  No  Memory:  Immediate;   Good Recent;   Good Remote;   Good  Judgement:  Good  Insight:  Fair  Psychomotor Activity:  Normal  Concentration:  Concentration: Good and Attention Span: Good  Recall:  Good  Fund of Knowledge:  Good  Language:  Good  Akathisia:  Negative  Handed:  Left  AIMS (if indicated):     Assets:  Communication Skills Desire for Improvement Financial Resources/Insurance Housing Intimacy Physical Health Resilience Transportation Vocational/Educational  ADL's:  Intact  Cognition:  WNL  Sleep:         Assessment:     Depression Contraception management    Plan:     Will restart citalopram now and work to wean infant(only breast feeding 30% of the time as it is), and then will switch to wellbutrin as it worked best for her in the past. Also will start [place] June 1st.  RTC in 6 weeks for annual and med check. Sooner if needed.   Adonias Demore,CNM

## 2019-01-11 ENCOUNTER — Encounter: Payer: BC Managed Care – PPO | Admitting: Obstetrics and Gynecology

## 2019-01-29 ENCOUNTER — Other Ambulatory Visit: Payer: Self-pay | Admitting: Obstetrics and Gynecology

## 2019-02-17 ENCOUNTER — Other Ambulatory Visit: Payer: Self-pay

## 2019-02-17 ENCOUNTER — Encounter: Payer: Self-pay | Admitting: Obstetrics and Gynecology

## 2019-02-17 ENCOUNTER — Ambulatory Visit (INDEPENDENT_AMBULATORY_CARE_PROVIDER_SITE_OTHER): Payer: BC Managed Care – PPO | Admitting: Obstetrics and Gynecology

## 2019-02-17 ENCOUNTER — Other Ambulatory Visit (HOSPITAL_COMMUNITY)
Admission: RE | Admit: 2019-02-17 | Discharge: 2019-02-17 | Disposition: A | Discharge status: Home / Self Care | Payer: BC Managed Care – PPO | Source: Ambulatory Visit | Attending: Obstetrics and Gynecology | Admitting: Obstetrics and Gynecology

## 2019-02-17 VITALS — BP 106/80 | HR 78 | Ht 61.0 in | Wt 167.4 lb

## 2019-02-17 DIAGNOSIS — Z01419 Encounter for gynecological examination (general) (routine) without abnormal findings: Secondary | ICD-10-CM

## 2019-02-17 DIAGNOSIS — Z713 Dietary counseling and surveillance: Secondary | ICD-10-CM

## 2019-02-17 DIAGNOSIS — B379 Candidiasis, unspecified: Secondary | ICD-10-CM

## 2019-02-17 DIAGNOSIS — Z6831 Body mass index (BMI) 31.0-31.9, adult: Secondary | ICD-10-CM

## 2019-02-17 DIAGNOSIS — F329 Major depressive disorder, single episode, unspecified: Secondary | ICD-10-CM

## 2019-02-17 DIAGNOSIS — F32A Depression, unspecified: Secondary | ICD-10-CM

## 2019-02-17 MED ORDER — FLUCONAZOLE 150 MG PO TABS: 150.0000 mg | ORAL_TABLET | ORAL | 3 refills | Status: DC

## 2019-02-17 NOTE — Progress Notes (Signed)
Subjective:   Krystal Blackwell is a 38 y.o. G30P1001 Caucasian female here for a routine well-woman exam.  Patient's last menstrual period was 01/20/2019.    Current complaints: none, feels better since starting wellbutrin last week. May have a yeast infection. PCP: Doren Custard       doesn't desire labs  Social History: Sexual: heterosexual Marital Status: divorced Living situation: with family Occupation: homemaker Tobacco/alcohol: no tobacco use Illicit drugs: no history of illicit drug use  The following portions of the patient's history were reviewed and updated as appropriate: allergies, current medications, past family history, past medical history, past social history, past surgical history and problem list.  Past Medical History Past Medical History:  Diagnosis Date  . Blood transfusion without reported diagnosis    9833,8250  . Depression   . Diverticulitis   . Diverticulosis   . Ovarian cyst   . Pelvic pain in female   . Recurrent sinus infections   . Scoliosis     Past Surgical History Past Surgical History:  Procedure Laterality Date  . NASAL SEPTUM SURGERY  11/2013  . SPINE SURGERY  97,98,99,00   scoliosis    Gynecologic History G3P1001  Patient's last menstrual period was 01/20/2019. Contraception: NuvaRing vaginal inserts Last Pap: 2017. Results were: normal   Obstetric History OB History  Gravida Para Term Preterm AB Living  3 3 1     1   SAB TAB Ectopic Multiple Live Births        0 1    # Outcome Date GA Lbr Len/2nd Weight Sex Delivery Anes PTL Lv  3 Term 05/29/18 [redacted]w[redacted]d / 00:28 7 lb 14.6 oz (3.59 kg) F Vag-Spont EPI  LIV  2 Para           1 Para             Current Medications Current Outpatient Medications on File Prior to Visit  Medication Sig Dispense Refill  . buPROPion (WELLBUTRIN SR) 150 MG 12 hr tablet Take 1 tablet (150 mg total) by mouth 2 (two) times daily. 60 tablet 6  . etonogestrel-ethinyl estradiol (NUVARING) 0.12-0.015 MG/24HR  vaginal ring Insert vaginally and leave in place for 3 consecutive weeks, then remove for 1 week. 1 each 12  . azithromycin (ZITHROMAX) 250 MG tablet Take 1 tablet (250 mg total) by mouth daily. Take 2 tablets on day 1, then 1 tablet until finished (Patient not taking: Reported on 01/06/2019) 6 tablet 0  . citalopram (CELEXA) 10 MG tablet TAKE 1 TABLET BY MOUTH EVERY DAY (Patient not taking: Reported on 02/17/2019) 30 tablet 0  . Prenatal Vit-Fe Fumarate-FA (PRENATAL MULTIVITAMIN) TABS tablet Take 1 tablet by mouth daily at 12 noon.    . Probiotic Product (SOLUBLE FIBER/PROBIOTICS PO) Take 1 tablet by mouth 1 day or 1 dose.     No current facility-administered medications on file prior to visit.     Review of Systems Patient denies any headaches, blurred vision, shortness of breath, chest pain, abdominal pain, problems with bowel movements, urination, or intercourse.  Objective:  BP 106/80   Pulse 78   Ht 5\' 1"  (1.549 m)   Wt 167 lb 6.4 oz (75.9 kg)   LMP 01/20/2019   BMI 31.63 kg/m  Physical Exam  General:  Well developed, well nourished, no acute distress. She is alert and oriented x3. Skin:  Warm and dry Neck:  Midline trachea, no thyromegaly or nodules Cardiovascular: Regular rate and rhythm, no murmur heard Lungs:  Effort normal,  all lung fields clear to auscultation bilaterally Breasts:  No dominant palpable mass, retraction, or nipple discharge Abdomen:  Soft, non tender, no hepatosplenomegaly or masses Pelvic:  External genitalia is normal in appearance.  The vagina is normal in appearance. Thick yeast discharge noted The cervix is bulbous, no CMT.  Thin prep pap is done with HR HPV cotesting. Uterus is felt to be normal size, shape, and contour.  No adnexal masses or tenderness noted. Extremities:  No swelling or varicosities noted Psych:  She has a normal mood and affect  Assessment:   Healthy well-woman exam BMI 31 Vaginal yeast infection  Plan:  Diflucan 150mg  po  prescribed, may repeat weekly as needed. Request B12 injection-one given today.  F/U 1 year for AE, or sooner if needed   Antwanette Wesche Rockney Ghee, CNM

## 2019-02-17 NOTE — Patient Instructions (Signed)
 Preventive Care 21-39 Years Old, Female Preventive care refers to visits with your health care provider and lifestyle choices that can promote health and wellness. This includes:  A yearly physical exam. This may also be called an annual well check.  Regular dental visits and eye exams.  Immunizations.  Screening for certain conditions.  Healthy lifestyle choices, such as eating a healthy diet, getting regular exercise, not using drugs or products that contain nicotine and tobacco, and limiting alcohol use. What can I expect for my preventive care visit? Physical exam Your health care provider will check your:  Height and weight. This may be used to calculate body mass index (BMI), which tells if you are at a healthy weight.  Heart rate and blood pressure.  Skin for abnormal spots. Counseling Your health care provider may ask you questions about your:  Alcohol, tobacco, and drug use.  Emotional well-being.  Home and relationship well-being.  Sexual activity.  Eating habits.  Work and work environment.  Method of birth control.  Menstrual cycle.  Pregnancy history. What immunizations do I need?  Influenza (flu) vaccine  This is recommended every year. Tetanus, diphtheria, and pertussis (Tdap) vaccine  You may need a Td booster every 10 years. Varicella (chickenpox) vaccine  You may need this if you have not been vaccinated. Human papillomavirus (HPV) vaccine  If recommended by your health care provider, you may need three doses over 6 months. Measles, mumps, and rubella (MMR) vaccine  You may need at least one dose of MMR. You may also need a second dose. Meningococcal conjugate (MenACWY) vaccine  One dose is recommended if you are age 19-21 years and a first-year college student living in a residence hall, or if you have one of several medical conditions. You may also need additional booster doses. Pneumococcal conjugate (PCV13) vaccine  You may need  this if you have certain conditions and were not previously vaccinated. Pneumococcal polysaccharide (PPSV23) vaccine  You may need one or two doses if you smoke cigarettes or if you have certain conditions. Hepatitis A vaccine  You may need this if you have certain conditions or if you travel or work in places where you may be exposed to hepatitis A. Hepatitis B vaccine  You may need this if you have certain conditions or if you travel or work in places where you may be exposed to hepatitis B. Haemophilus influenzae type b (Hib) vaccine  You may need this if you have certain conditions. You may receive vaccines as individual doses or as more than one vaccine together in one shot (combination vaccines). Talk with your health care provider about the risks and benefits of combination vaccines. What tests do I need?  Blood tests  Lipid and cholesterol levels. These may be checked every 5 years starting at age 20.  Hepatitis C test.  Hepatitis B test. Screening  Diabetes screening. This is done by checking your blood sugar (glucose) after you have not eaten for a while (fasting).  Sexually transmitted disease (STD) testing.  BRCA-related cancer screening. This may be done if you have a family history of breast, ovarian, tubal, or peritoneal cancers.  Pelvic exam and Pap test. This may be done every 3 years starting at age 21. Starting at age 30, this may be done every 5 years if you have a Pap test in combination with an HPV test. Talk with your health care provider about your test results, treatment options, and if necessary, the need for more   tests. Follow these instructions at home: Eating and drinking   Eat a diet that includes fresh fruits and vegetables, whole grains, lean protein, and low-fat dairy.  Take vitamin and mineral supplements as recommended by your health care provider.  Do not drink alcohol if: ? Your health care provider tells you not to drink. ? You are  pregnant, may be pregnant, or are planning to become pregnant.  If you drink alcohol: ? Limit how much you have to 0-1 drink a day. ? Be aware of how much alcohol is in your drink. In the [place] one drink equals one 12 oz bottle of beer (355 mL), one 5 oz glass of wine (148 mL), or one 1 oz glass of hard liquor (44 mL). Lifestyle  Take daily care of your teeth and gums.  Stay active. Exercise for at least 30 minutes on 5 or more days each week.  Do not use any products that contain nicotine or tobacco, such as cigarettes, e-cigarettes, and chewing tobacco. If you need help quitting, ask your health care provider.  If you are sexually active, practice safe sex. Use a condom or other form of birth control (contraception) in order to prevent pregnancy and STIs (sexually transmitted infections). If you plan to become pregnant, see your health care provider for a preconception visit. What's next?  Visit your health care provider once a year for a well check visit.  Ask your health care provider how often you should have your eyes and teeth checked.  Stay up to date on all vaccines. This information is not intended to replace advice given to you by your health care provider. Make sure you discuss any questions you have with your health care provider. Document Released: 09/30/2001 Document Revised: 04/15/2018 Document Reviewed: 04/15/2018 Elsevier Patient Education  2020 [employer].

## 2019-02-23 LAB — CYTOLOGY - PAP
Chlamydia: NEGATIVE
Diagnosis: NEGATIVE
HPV: NOT DETECTED
Neisseria Gonorrhea: NEGATIVE

## 2019-05-12 ENCOUNTER — Ambulatory Visit (INDEPENDENT_AMBULATORY_CARE_PROVIDER_SITE_OTHER): Payer: BC Managed Care – PPO | Admitting: Obstetrics and Gynecology

## 2019-05-12 ENCOUNTER — Other Ambulatory Visit: Payer: Self-pay

## 2019-05-12 ENCOUNTER — Encounter: Payer: Self-pay | Admitting: Obstetrics and Gynecology

## 2019-05-12 VITALS — BP 131/90 | HR 97 | Ht 61.0 in | Wt 167.2 lb

## 2019-05-12 DIAGNOSIS — F329 Major depressive disorder, single episode, unspecified: Secondary | ICD-10-CM

## 2019-05-12 DIAGNOSIS — G5600 Carpal tunnel syndrome, unspecified upper limb: Secondary | ICD-10-CM | POA: Diagnosis not present

## 2019-05-12 DIAGNOSIS — F32A Depression, unspecified: Secondary | ICD-10-CM

## 2019-05-12 MED ORDER — BUPROPION HCL 75 MG PO TABS: 75.0000 mg | ORAL_TABLET | Freq: Two times a day (BID) | ORAL | 6 refills | Status: DC

## 2019-05-12 MED ORDER — ARIPIPRAZOLE 5 MG PO TABS: 5.0000 mg | ORAL_TABLET | Freq: Every day | ORAL | 3 refills | Status: DC

## 2019-05-12 NOTE — Progress Notes (Signed)
  Subjective:     Patient ID: Krystal Blackwell, female   DOB: 1981/08/04, 38 y.o.   MRN: [id number]  HPI Feels all over the place emotionally. States still stressed about relationship with partner. And dealing with children. Carpal tunnel is flaring up due to being on computer a lot. Wakes her up at night.  Has been on multiple different SSRIs & SNRIs in the past and wellbutrin has worked the best. Stopped it 2 weeks ago 'just because' and is ok to restart it, but wants to do lower dose.   Depression screen [facility] 2/9 05/12/2019 02/17/2019 01/06/2019 07/14/2018 03/12/2017  Decreased Interest 2 1 3 1 3   Down, Depressed, Hopeless 3 1 3 1 3   PHQ - 2 Score 5 2 6 2 6   Altered sleeping 3 0 3 0 3  Tired, decreased energy 3 2 3  0 3  Change in appetite 3 0 3 0 3  Feeling bad or failure about yourself  2 0 1 1 0  Trouble concentrating 0 0 1 1 2   Moving slowly or fidgety/restless 0 0 1 1 1   Suicidal thoughts 1 0 1 0 0  PHQ-9 Score 17 4 19 5 18   Difficult doing work/chores Somewhat difficult Not difficult at all Somewhat difficult - -   GAD 7 : Generalized Anxiety Score 05/12/2019  Nervous, Anxious, on Edge 0    Review of Systems     Objective:   Physical Exam A&Ox4 Well groomed female in no distress Vitals with BMI 05/12/2019 02/17/2019 01/06/2019  Height 5\' 1"  5\' 1"  5\' 1"   Weight 167 lbs 3 oz 167 lbs 6 oz 163 lbs 10 oz  BMI 31.61 [id number]  Systolic A999333 [id number]  Diastolic 90 80 75  Pulse 97 78 78     Psychiatric Specialty Exam: Physical Exam  ROS  Blood pressure 131/90, pulse 97, height 5\' 1"  (1.549 m), weight 167 lb 3.2 oz (75.8 kg), last menstrual period 04/29/2019, currently breastfeeding.Body mass index is 31.59 kg/m.  General Appearance: Fairly Groomed  Eye Contact:  Good  Speech:  Pressured  Volume:  Normal  Mood:  Dysphoric and Irritable  Affect:  Appropriate, Blunt and Tearful  Thought Process:  Disorganized and Goal Directed  Orientation:  Full (Time, Place, and Person)   Thought Content:  Logical  Suicidal Thoughts:  No  Homicidal Thoughts:  No  Memory:  Immediate;   Good Recent;   Good Remote;   Good  Judgement:  Fair  Insight:  Fair  Psychomotor Activity:  Restlessness  Concentration:  Concentration: Good and Attention Span: Fair  Recall:  Good  Fund of Knowledge:  Good  Language:  Good  Akathisia:  Yes  Handed:  Left  AIMS (if indicated):     Assets:  Communication Skills Desire for Improvement Financial Resources/Insurance Housing Intimacy Resilience Transportation Vocational/Educational  ADL's:  Intact  Cognition:  WNL  Sleep:      Assessment:     Depression w/ aggitation Carpal tunnel bilaterally   sleep disturbance Plan:     Will restart wellbutrin at 75mg  each am, and add abilify 5mg  daily. Will let me know via MyChart in 4-6 weeks how she is doing. Will reach out to ortho for carpal tunnel.  Melody Shambley,CNM

## 2019-05-24 ENCOUNTER — Telehealth: Payer: Self-pay | Admitting: Obstetrics and Gynecology

## 2019-05-24 NOTE — Telephone Encounter (Signed)
Have her take half a tablet, but if it keeps happening, to stop it.   Krystal Blackwell

## 2019-05-24 NOTE — Telephone Encounter (Signed)
The patient called and stated that she is wanting to let her nurse know that the prescription ARIPiprazole (ABILIFY) 5 MG tablet [36438] is making the patient clench her jaw. Please advise.

## 2019-05-24 NOTE — Telephone Encounter (Signed)
pls advise

## 2019-06-30 ENCOUNTER — Telehealth: Payer: Self-pay

## 2019-06-30 NOTE — Telephone Encounter (Signed)
mychart message sent to patient

## 2019-06-30 NOTE — Telephone Encounter (Signed)
Pt request baby formula. Pt states we usually leave some at the front desk for her to pick up.

## 2019-09-20 ENCOUNTER — Encounter: Payer: BC Managed Care – PPO | Admitting: Obstetrics and Gynecology

## 2019-09-23 ENCOUNTER — Encounter: Payer: BC Managed Care – PPO | Admitting: Obstetrics and Gynecology

## 2019-09-29 ENCOUNTER — Encounter: Payer: Self-pay | Admitting: Obstetrics and Gynecology

## 2019-09-29 ENCOUNTER — Ambulatory Visit (INDEPENDENT_AMBULATORY_CARE_PROVIDER_SITE_OTHER): Payer: BC Managed Care – PPO | Admitting: Obstetrics and Gynecology

## 2019-09-29 ENCOUNTER — Other Ambulatory Visit: Payer: Self-pay

## 2019-09-29 VITALS — BP 131/83 | HR 91 | Ht 61.0 in | Wt 169.4 lb

## 2019-09-29 DIAGNOSIS — F329 Major depressive disorder, single episode, unspecified: Secondary | ICD-10-CM

## 2019-09-29 DIAGNOSIS — F32A Depression, unspecified: Secondary | ICD-10-CM

## 2019-09-29 DIAGNOSIS — M542 Cervicalgia: Secondary | ICD-10-CM | POA: Diagnosis not present

## 2019-09-29 NOTE — Progress Notes (Signed)
HPI:      [name] is a 39 y.o. G3P1001 who LMP was Patient's last menstrual period was 09/12/2019.  Subjective:   She presents today for discussion regarding 2 issues.  She has had significant issues with postpartum depression and depression in general and has failed multiple SSRIs.  She is now using magnet therapy through a behavioral health program in [place].  She says it seems to be working but her schedule will no longer allow her to do this therapy.  She is not currently taking any antidepressant medication and seems to be doing well.  She has concerned that once she starts a new schedule by going back to school/work her depression will return and she wants to guard against this.  I have offered her several options and she has chosen a referral to psychiatry because she has "tried behavioral health before and PCP" and this has not worked effectively.  In addition she states she has a long history of scoliosis and at one time had rods in her back which were removed.  She states that her back is starting to give her problems at this time and she would like an evaluation.  She is requesting an Ortho referral.    Hx: The following portions of the patient's history were reviewed and updated as appropriate:             She  has a past medical history of Blood transfusion without reported diagnosis, Depression, Diverticulitis, Diverticulosis, Ovarian cyst, Pelvic pain in female, Recurrent sinus infections, and Scoliosis. She does not have any pertinent problems on file. She  has a past surgical history that includes Spine surgery [id number]) and Nasal septum surgery (11/2013). Her family history includes Alcohol abuse in her father; Arthritis in her maternal grandmother; Asthma in her mother; Drug abuse in her father; Hypertension in her mother; Miscarriages / Stillbirths in her mother; Pancreatic cancer in her father. She  reports that she has been smoking. She has never used  smokeless tobacco. She reports previous alcohol use of about 6.0 standard drinks of alcohol per week. She reports that she does not use drugs. She has a current medication list which includes the following prescription(s): etonogestrel-ethinyl estradiol and probiotic product. She is allergic to abilify [aripiprazole] and avelox [moxifloxacin hcl in nacl].       Review of Systems:  Review of Systems  Constitutional: Denied constitutional symptoms, night sweats, recent illness, fatigue, fever, insomnia and weight loss.  Eyes: Denied eye symptoms, eye pain, photophobia, vision change and visual disturbance.  Ears/Nose/Throat/Neck: Denied ear, nose, throat or neck symptoms, hearing loss, nasal discharge, sinus congestion and sore throat.  Cardiovascular: Denied cardiovascular symptoms, arrhythmia, chest pain/pressure, edema, exercise intolerance, orthopnea and palpitations.  Respiratory: Denied pulmonary symptoms, asthma, pleuritic pain, productive sputum, cough, dyspnea and wheezing.  Gastrointestinal: Denied, gastro-esophageal reflux, melena, nausea and vomiting.  Genitourinary: Denied genitourinary symptoms including symptomatic vaginal discharge, pelvic relaxation issues, and urinary complaints.  Musculoskeletal: See HPI for additional information.  Dermatologic: Denied dermatology symptoms, rash and scar.  Neurologic: Denied neurology symptoms, dizziness, headache, neck pain and syncope.  Psychiatric: See HPI for additional information.  Endocrine: Denied endocrine symptoms including hot flashes and night sweats.   Meds:   Current Outpatient Medications on File Prior to Visit  Medication Sig Dispense Refill  . etonogestrel-ethinyl estradiol (NUVARING) 0.12-0.015 MG/24HR vaginal ring Insert vaginally and leave in place for 3 consecutive weeks, then remove for 1 week. 1 each 12  . Probiotic  Product (SOLUBLE FIBER/PROBIOTICS PO) Take 1 tablet by mouth 1 day or 1 dose.     No current  facility-administered medications on file prior to visit.    Objective:     Vitals:   09/29/19 0809  BP: 131/83  Pulse: 91                Assessment:    G3P1001 Patient Active Problem List   Diagnosis Date Noted  . Diverticulitis of colon 06/16/2016  . 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 Vitamin D deficiency 06/19/2014  . Depression with anxiety      1. Depression, unspecified depression type   2. Neck pain        Plan:            1.  Referral to psychiatry at patient request  2.  Referral to Ortho at patient request  Orders Orders Placed This Encounter  Procedures  . AMB referral to orthopedics    No orders of the defined types were placed in this encounter.     F/U  No follow-ups on file. I spent 22 minutes involved in the care of this patient preparing to see the patient by obtaining and reviewing her medical history (including labs, imaging tests and prior procedures), documenting clinical information in the electronic health record (EHR), counseling and coordinating care plans, writing and sending prescriptions, ordering tests or procedures and directly communicating with the patient by discussing pertinent items from her history and physical exam as well as detailing my assessment and plan as noted above so that she has an informed understanding.  All of her questions were answered.  Finis Bud, M.D. 09/29/2019 8:41 AM

## 2019-10-10 ENCOUNTER — Telehealth: Payer: Self-pay | Admitting: Obstetrics and Gynecology

## 2019-10-10 DIAGNOSIS — M542 Cervicalgia: Secondary | ICD-10-CM

## 2019-10-10 DIAGNOSIS — F32A Depression, unspecified: Secondary | ICD-10-CM

## 2019-10-10 DIAGNOSIS — F329 Major depressive disorder, single episode, unspecified: Secondary | ICD-10-CM

## 2019-10-10 NOTE — Telephone Encounter (Signed)
I spoke with patient to let her know that I have placed referral to psychiatric for her. I told her that they will give her a call to schedule. Patient verbalized understanding.

## 2019-10-10 NOTE — Telephone Encounter (Signed)
Patient called and wanted to follow up with her referrals Dr. Ellard Artis had made. It was only showing me one for Orthopedic Surgery but she said there should've been one for psychiatric as well. Could you please advise this patient.  -TC

## 2019-10-28 ENCOUNTER — Telehealth (INDEPENDENT_AMBULATORY_CARE_PROVIDER_SITE_OTHER): Payer: BC Managed Care – PPO | Admitting: Family Medicine

## 2019-10-28 DIAGNOSIS — J329 Chronic sinusitis, unspecified: Secondary | ICD-10-CM | POA: Diagnosis not present

## 2019-10-28 MED ORDER — AMOXICILLIN-POT CLAVULANATE 875-125 MG PO TABS: 1.0000 | ORAL_TABLET | Freq: Two times a day (BID) | ORAL | 0 refills | Status: DC

## 2019-10-28 MED ORDER — FLUCONAZOLE 150 MG PO TABS: 150.0000 mg | ORAL_TABLET | Freq: Once | ORAL | 0 refills | Status: AC

## 2019-10-28 NOTE — Progress Notes (Signed)
Subjective:    Patient ID: Krystal Blackwell, female    DOB: 10-23-1980, 39 y.o.   MRN: [id number]  HPI Patient is a very pleasant 39 year old Caucasian female who is being seen today for possible sinus infection.  She is being seen as a video/telephone visit.  She consents to be seen via video and telephone.  Phone call began at 920.  Phone call concluded at 927.  Patient states that she has had symptoms for 5 or 6 days.  Symptoms include fluid behind both ears.  Her head feels stopped.  She is having a difficult time hearing.  She is having thick sinus drainage that is green and foul-smelling.  Her head is stopped.  She has pain and pressure behind both eyes and in both cheekbones.  She denies any shortness of breath or cough or chest pain.  She has a longstanding history of sinus infections and this feels similar to her previous sinus infection. Past Medical History:  Diagnosis Date  . Blood transfusion without reported diagnosis    [id number]  . Depression   . Diverticulitis   . Diverticulosis   . Ovarian cyst   . Pelvic pain in female   . Recurrent sinus infections   . Scoliosis    Past Surgical History:  Procedure Laterality Date  . NASAL SEPTUM SURGERY  11/2013  . SPINE SURGERY  [phone]   scoliosis   Current Outpatient Medications on File Prior to Visit  Medication Sig Dispense Refill  . etonogestrel-ethinyl estradiol (NUVARING) 0.12-0.015 MG/24HR vaginal ring Insert vaginally and leave in place for 3 consecutive weeks, then remove for 1 week. 1 each 12  . Probiotic Product (SOLUBLE FIBER/PROBIOTICS PO) Take 1 tablet by mouth 1 day or 1 dose.     No current facility-administered medications on file prior to visit.   Allergies  Allergen Reactions  . Abilify [Aripiprazole]     Made jaw twitch  . Avelox [Moxifloxacin Hcl In Nacl]    Social History   Socioeconomic History  . Marital status: Legally Separated    Spouse name: Not on file  . Number of children: 2  .  Years of education: Not on file  . Highest education level: Not on file  Occupational History  . Occupation: EC Teacher  Tobacco Use  . Smoking status: Current Some Day Smoker  . Smokeless tobacco: Never Used  . Tobacco comment: 2-3 CIGARETTES EVERY TWO WEEKS  Substance and Sexual Activity  . Alcohol use: Not Currently    Alcohol/week: 6.0 standard drinks    Types: 6 Cans of beer per week    Comment: SOCIALLY  . Drug use: No  . Sexual activity: Yes    Birth control/protection: None, Other-see comments, Inserts  Other Topics Concern  . Not on file  Social History Narrative   Entered 06/2014:   Has 2 children--both are boys--one is 54 y/o. The other is "teenager"--not sure exact age.   At home --it is her and her 2 sons.   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   Social Determinants of Health   Financial Resource Strain:   . Difficulty of Paying Living Expenses:   Food Insecurity:   . Worried About Charity fundraiser in the Last Year:   . Arboriculturist in the Last Year:   Transportation Needs:   . Film/video editor (Medical):   Marland Kitchen Lack of Transportation (Non-Medical):   Physical Activity:   .  Days of Exercise per Week:   . Minutes of Exercise per Session:   Stress:   . Feeling of Stress :   Social Connections:   . Frequency of Communication with Friends and Family:   . Frequency of Social Gatherings with Friends and Family:   . Attends Religious Services:   . Active Member of Clubs or Organizations:   . Attends Archivist Meetings:   Marland Kitchen Marital Status:   Intimate Partner Violence:   . Fear of Current or Ex-Partner:   . Emotionally Abused:   Marland Kitchen Physically Abused:   . Sexually Abused:       Review of Systems  All other systems reviewed and are negative.      Objective:   Physical Exam        Assessment & Plan:  Recurrent rhinosinusitis  I will treat the patient for a sinus infection with Augmentin 875 mg p.o.  twice daily for 10 days.  I did give the patient a prescription for Diflucan in case she gets a yeast infection.  Reassess if no better in 1 week or sooner if worse.

## 2019-11-02 ENCOUNTER — Other Ambulatory Visit: Payer: Self-pay

## 2019-11-02 ENCOUNTER — Ambulatory Visit (INDEPENDENT_AMBULATORY_CARE_PROVIDER_SITE_OTHER): Payer: BC Managed Care – PPO | Admitting: Psychiatry

## 2019-11-02 ENCOUNTER — Encounter: Payer: Self-pay | Admitting: Psychiatry

## 2019-11-02 DIAGNOSIS — F3181 Bipolar II disorder: Secondary | ICD-10-CM

## 2019-11-02 MED ORDER — LAMOTRIGINE 25 MG PO TABS: ORAL_TABLET | ORAL | 0 refills | Status: DC

## 2019-11-02 NOTE — Progress Notes (Signed)
Psychiatric Initial Adult Assessment   I connected with  Krystal Blackwell on 11/02/19 by a video enabled telemedicine application and verified that I am speaking with the correct person using two identifiers.   I discussed the limitations of evaluation and management by telemedicine. The patient expressed understanding and agreed to proceed.    Patient Identification: Krystal Blackwell MRN:  [id number] Date of Evaluation:  11/02/2019   Referral Source: Dr. Amalia Hailey  Chief Complaint:   " My mood is up and down during the day."  Visit Diagnosis:    ICD-10-CM   1. Bipolar 2 disorder (HCC)  F31.81 lamoTRIgine (LAMICTAL) 25 MG tablet    History of Present Illness: This is a 39 year old female with history of depression and anxiety now seen for psychiatric evaluation.  Patient reported that she has lost her depression and she has taken multiple different medications for the same.  She stated that none of the medications really helped her completely.  She even underwent [place] for 4.5 weeks last year and other than a partial improvement initially she did not notice to be too helpful. She reported that ever since she had her daughter about 16 months ago she has noticed that her mood has become more intense.  She stated that she has not felt very depressed and isolated however she does feel very irritable lately.  She stated that her irritability escalates to being aggressive.  That has resulted in difficulties in her relationships.  She also reported that she feels her mood fluctuates easily.  She has noticed that she can have periods of time when she is very energetic and impulsive.  She stated that lately she has noticed that she has been spending a lot of money on things that she does not need and has also started going out more than usual.  She stated that she would make excuses to get out of the house and spend time with her friends 2-3 times per week.  She also reported that she took a few occasions when  she did not really need to.  She stated that she has not felt decreased need for sleep as she does need to sleep for a few hours every night.  However she does have fluctuating periods of time when she may sleep all night long and then will not sleep well on some other nights.  She also mentioned that her 24-month-old daughter sleeps with her. She reported that she has never gotten in any kind of legal trouble and has not had to go to the hospital for urgent care for psychiatric issues. She denies any suicidal ideations at present.  She denied any past suicide attempts. She reported that her son has been diagnosed with bipolar disorder.  He is 40 years old now and does not live with them anymore.  She stated that he has been through a lot of different medications and also has been admitted to psychiatric hospitals a few times.  She stated that when she looks back in time she feels that she also underwent all the things that he went through while growing up as a teenager.  She stated that her son's father was diagnosed with bipolar disorder however he is deceased now.  She denied any symptom suggestive of psychosis or PTSD.  Past medications tried-Celexa, Prozac, Lexapro, Effexor, Zoloft, Wellbutrin.  Also took Abilify 5 mg which caused her to have stiffness of jaw.  Did 4.5 weeks of [place] in 2020, could not complete due to  her work schedule.  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  Has total of 3 kids.  Her 6 year old son is out of home now.  She lives with her 51 year old son, 67-month-old daughter and her partner.    Associated Signs/Symptoms: Depression Symptoms:  psychomotor agitation, difficulty concentrating, impaired memory, disturbed sleep, (Hypo) Manic Symptoms:  Distractibility, Elevated Mood, Flight of Ideas, Community education officer, Impulsivity, Irritable Mood, Labiality of Mood, Anxiety Symptoms:  Excessive Worry, Psychotic Symptoms:  denied PTSD  Symptoms: Negative  Past Psychiatric History: MDD, anxiety  Previous Psychotropic Medications: Yes   Substance Abuse History in the last 12 months:  No.  Consequences of Substance Abuse: NA  Past Medical History:  Past Medical History:  Diagnosis Date  . Blood transfusion without reported diagnosis    [id number]  . Depression   . Diverticulitis   . Diverticulosis   . Ovarian cyst   . Pelvic pain in female   . Recurrent sinus infections   . Scoliosis     Past Surgical History:  Procedure Laterality Date  . NASAL SEPTUM SURGERY  11/2013  . SPINE SURGERY  97,98,99,00   scoliosis    Family Psychiatric History: Mother-anxiety, depression, 68 year old son diagnosed with bipolar disorder  Family History:  Family History  Problem Relation Age of Onset  . Asthma Mother   . Hypertension Mother   . Miscarriages / [place] Mother   . Alcohol abuse Father   . Drug abuse Father   . Pancreatic cancer Father   . Arthritis Maternal Grandmother   . Colon cancer Neg Hx   . Stomach cancer Neg Hx   . Esophageal cancer Neg Hx   . Rectal cancer Neg Hx   . Liver cancer Neg Hx     Social History:   Social History   Socioeconomic History  . Marital status: Legally Separated    Spouse name: Not on file  . Number of children: 2  . Years of education: Not on file  . Highest education level: Not on file  Occupational History  . Occupation: EC Teacher  Tobacco Use  . Smoking status: Current Some Day Smoker  . Smokeless tobacco: Never Used  . Tobacco comment: 2-3 CIGARETTES EVERY TWO WEEKS  Substance and Sexual Activity  . Alcohol use: Not Currently    Alcohol/week: 6.0 standard drinks    Types: 6 Cans of beer per week    Comment: SOCIALLY  . Drug use: No  . Sexual activity: Yes    Birth control/protection: None, Other-see comments, Inserts  Other Topics Concern  . Not on file  Social History Narrative   Entered 06/2014:   Has 2 children--both are boys--one is 40 y/o.  The other is "teenager"--not sure exact age.   At home --it is her and her 2 sons.   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   Social Determinants of Health   Financial Resource Strain:   . Difficulty of Paying Living Expenses:   Food Insecurity:   . Worried About Charity fundraiser in the Last Year:   . Arboriculturist in the Last Year:   Transportation Needs:   . Film/video editor (Medical):   Marland Kitchen Lack of Transportation (Non-Medical):   Physical Activity:   . Days of Exercise per Week:   . Minutes of Exercise per Session:   Stress:   . Feeling of Stress :   Social Connections:   . Frequency of  Communication with Friends and Family:   . Frequency of Social Gatherings with Friends and Family:   . Attends Religious Services:   . Active Member of Clubs or Organizations:   . Attends Archivist Meetings:   Marland Kitchen Marital Status:     Additional Social History: See HPI  Allergies:   Allergies  Allergen Reactions  . Abilify [Aripiprazole]     Made jaw twitch  . Avelox [Moxifloxacin Hcl In Nacl]     Metabolic Disorder Labs: Lab Results  Component Value Date   HGBA1C 4.9 11/12/2017   No results found for: PROLACTIN Lab Results  Component Value Date   CHOL 200 (H) 08/17/2015   TRIG 118 08/17/2015   HDL 84 08/17/2015   CHOLHDL 2.4 08/17/2015   LDLCALC 92 08/17/2015   Lab Results  Component Value Date   TSH 1.560 11/12/2017    Therapeutic Level Labs: No results found for: LITHIUM No results found for: CBMZ No results found for: VALPROATE  Current Medications: Current Outpatient Medications  Medication Sig Dispense Refill  . amoxicillin-clavulanate (AUGMENTIN) 875-125 MG tablet Take 1 tablet by mouth 2 (two) times daily. 20 tablet 0  . etonogestrel-ethinyl estradiol (NUVARING) 0.12-0.015 MG/24HR vaginal ring Insert vaginally and leave in place for 3 consecutive weeks, then remove for 1 week. 1 each 12  . lamoTRIgine (LAMICTAL)  25 MG tablet Take 1 tablet daily for 2 weeks then take 2 tablets daily for 2 weeks then take 3 tablets daily 90 tablet 0  . Probiotic Product (SOLUBLE FIBER/PROBIOTICS PO) Take 1 tablet by mouth 1 day or 1 dose.     No current facility-administered medications for this visit.    Musculoskeletal: Strength & Muscle Tone: unable to assess due to telemed visit Gait & Station: unable to assess due to telemed visit Patient leans: unable to assess due to telemed visit  Psychiatric Specialty Exam: Review of Systems  currently breastfeeding.There is no height or weight on file to calculate BMI.  General Appearance: Well Groomed  Eye Contact:  Good  Speech:  Rapid  Volume:  Normal  Mood:  Euphoric  Affect:  Congruent  Thought Process:  Goal Directed and Descriptions of Associations: Circumstantial  Orientation:  Full (Time, Place, and Person)  Thought Content:  Logical  Suicidal Thoughts:  No  Homicidal Thoughts:  No  Memory:  Immediate;   Good Recent;   Good  Judgement:  Fair  Insight:  Fair  Psychomotor Activity:  Normal  Concentration:  Concentration: Good and Attention Span: Good  Recall:  Good  Fund of Knowledge:Good  Language: Good  Akathisia:  Negative  Handed:  Right  AIMS (if indicated):  Not done  Assets:  Communication Skills Desire for Improvement Financial Resources/Insurance Housing Social Support Transportation Vocational/Educational  ADL's:  Intact  Cognition: WNL  Sleep:  Fair   Screenings: PHQ2-9     Office Visit from 05/12/2019 in [facility] Visit from 02/17/2019 in [facility] Visit from 01/06/2019 in [facility] Visit from 07/14/2018 in [facility] Visit from 03/12/2017 in [facility]  PHQ-2 Total Score  5  2  6  2  6   PHQ-9 Total Score  17  4  19  5  18       Assessment and Plan: Based on her assessment, patient meets criteria for bipolar 2 disorder given her history of mood  irritability and hypomanic symptoms.  She has not responded well to several different antidepressants  in the past.  She was agreeable to trying mood stabilizer namely Lamictal. Potential side effects of medication and risks vs benefits of treatment vs non-treatment were explained and discussed. All questions were answered.  1. Bipolar 2 disorder (HCC)  -Start lamoTRIgine (LAMICTAL) 25 MG tablet; Take 1 tablet daily for 2 weeks then take 2 tablets daily for 2 weeks then take 3 tablets daily  Dispense: 90 tablet; Refill: 0   Follow-up in 6 weeks.  Nevada Crane, MD 3/17/20212:20 PM

## 2019-12-14 ENCOUNTER — Encounter: Payer: Self-pay | Admitting: Psychiatry

## 2019-12-14 ENCOUNTER — Telehealth (INDEPENDENT_AMBULATORY_CARE_PROVIDER_SITE_OTHER): Payer: BC Managed Care – PPO | Admitting: Psychiatry

## 2019-12-14 ENCOUNTER — Other Ambulatory Visit: Payer: Self-pay

## 2019-12-14 DIAGNOSIS — F3181 Bipolar II disorder: Secondary | ICD-10-CM | POA: Diagnosis not present

## 2019-12-14 MED ORDER — LAMOTRIGINE 100 MG PO TABS: 100.0000 mg | ORAL_TABLET | Freq: Every day | ORAL | 1 refills | Status: DC

## 2019-12-14 MED ORDER — TRAZODONE HCL 50 MG PO TABS: 50.0000 mg | ORAL_TABLET | Freq: Every evening | ORAL | 1 refills | Status: DC | PRN

## 2019-12-14 NOTE — Progress Notes (Signed)
BH MD/PA/NP OP Progress Note  I connected with  Krystal Blackwell on 12/14/19 by a video enabled telemedicine application and verified that I am speaking with the correct person using two identifiers.   I discussed the limitations of evaluation and management by telemedicine. The patient expressed understanding and agreed to proceed.    12/14/2019 3:24 PM Krystal Blackwell  MRN:  [id number]  Chief Complaint:  " I am doing much better."  HPI: Patient was seen a few weeks ago and was diagnosed with bipolar disorder.  She was started on Lamictal with gradual titration to 75 mg daily.  Patient stated that she has noticed significant improvement in her mood ever since she started taking Lamictal.  She stated that she can think clearly and process things well.  She stated that she is more organized and getting more work accomplished.  She feels stable.  She stated that she is not spending unnecessary money. She did mention that she feels the medicine seems to wear off towards the end of the day and asked if her dose could be increased a little bit. She also mentioned that she has a hard time staying asleep and asked if she could try a sleeping pill.  She has taken trazodone in the past and used to take only a quarter of it as it was too strong for her. Patient did complain of noticing swelling in her right hand.  She stated she does have history of carpal tunnel syndrome and she is not sure if it is her carpal tunnel acting up.  Patient was explained that generally nondependent swelling is not commonly associated with Lamictal and that she should contact her PCP regarding that.  Visit Diagnosis:    ICD-10-CM   1. Bipolar 2 disorder (HCC)  F31.81     Past Psychiatric History: Depression, anxiety  Past Medical History:  Past Medical History:  Diagnosis Date  . Blood transfusion without reported diagnosis    [id number]  . Depression   . Diverticulitis   . Diverticulosis   . Ovarian cyst   .  Pelvic pain in female   . Recurrent sinus infections   . Scoliosis     Past Surgical History:  Procedure Laterality Date  . NASAL SEPTUM SURGERY  11/2013  . SPINE SURGERY  97,98,99,00   scoliosis    Family Psychiatric History: Mother-anxiety, depression, 61 year old son diagnosed with bipolar disorder    Family History:  Family History  Problem Relation Age of Onset  . Asthma Mother   . Hypertension Mother   . Miscarriages / [place] Mother   . Alcohol abuse Father   . Drug abuse Father   . Pancreatic cancer Father   . Arthritis Maternal Grandmother   . Colon cancer Neg Hx   . Stomach cancer Neg Hx   . Esophageal cancer Neg Hx   . Rectal cancer Neg Hx   . Liver cancer Neg Hx     Social History:  Social History   Socioeconomic History  . Marital status: Legally Separated    Spouse name: Not on file  . Number of children: 2  . Years of education: Not on file  . Highest education level: Not on file  Occupational History  . Occupation: EC Teacher  Tobacco Use  . Smoking status: Current Some Day Smoker  . Smokeless tobacco: Never Used  . Tobacco comment: 2-3 CIGARETTES EVERY TWO WEEKS  Substance and Sexual Activity  . Alcohol use: Not Currently  Alcohol/week: 6.0 standard drinks    Types: 6 Cans of beer per week    Comment: SOCIALLY  . Drug use: No  . Sexual activity: Yes    Birth control/protection: None, Other-see comments, Inserts  Other Topics Concern  . Not on file  Social History Narrative   Entered 06/2014:   Has 2 children--both are boys--one is 23 y/o. The other is "teenager"--not sure exact age.   At home --it is her and her 2 sons.   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   Social Determinants of Health   Financial Resource Strain:   . Difficulty of Paying Living Expenses:   Food Insecurity:   . Worried About Charity fundraiser in the Last Year:   . Arboriculturist in the Last Year:   Transportation Needs:    . Film/video editor (Medical):   Marland Kitchen Lack of Transportation (Non-Medical):   Physical Activity:   . Days of Exercise per Week:   . Minutes of Exercise per Session:   Stress:   . Feeling of Stress :   Social Connections:   . Frequency of Communication with Friends and Family:   . Frequency of Social Gatherings with Friends and Family:   . Attends Religious Services:   . Active Member of Clubs or Organizations:   . Attends Archivist Meetings:   Marland Kitchen Marital Status:     Allergies:  Allergies  Allergen Reactions  . Abilify [Aripiprazole]     Made jaw twitch  . Avelox [Moxifloxacin Hcl In Nacl]     Metabolic Disorder Labs: Lab Results  Component Value Date   HGBA1C 4.9 11/12/2017   No results found for: PROLACTIN Lab Results  Component Value Date   CHOL 200 (H) 08/17/2015   TRIG 118 08/17/2015   HDL 84 08/17/2015   CHOLHDL 2.4 08/17/2015   LDLCALC 92 08/17/2015   Lab Results  Component Value Date   TSH 1.560 11/12/2017   TSH 2.160 08/17/2015    Therapeutic Level Labs: No results found for: LITHIUM No results found for: VALPROATE No components found for:  CBMZ  Current Medications: Current Outpatient Medications  Medication Sig Dispense Refill  . amoxicillin-clavulanate (AUGMENTIN) 875-125 MG tablet Take 1 tablet by mouth 2 (two) times daily. 20 tablet 0  . etonogestrel-ethinyl estradiol (NUVARING) 0.12-0.015 MG/24HR vaginal ring Insert vaginally and leave in place for 3 consecutive weeks, then remove for 1 week. 1 each 12  . lamoTRIgine (LAMICTAL) 25 MG tablet Take 1 tablet daily for 2 weeks then take 2 tablets daily for 2 weeks then take 3 tablets daily 90 tablet 0  . Probiotic Product (SOLUBLE FIBER/PROBIOTICS PO) Take 1 tablet by mouth 1 day or 1 dose.     No current facility-administered medications for this visit.     Musculoskeletal: Strength & Muscle Tone: unable to assess due to telemed visit Gait & Station: unable to assess due to  telemed visit Patient leans: unable to assess due to telemed visit   Psychiatric Specialty Exam: Review of Systems  There were no vitals taken for this visit.There is no height or weight on file to calculate BMI.  General Appearance: Well Groomed  Eye Contact:  Good  Speech:  Clear and Coherent and Normal Rate  Volume:  Normal  Mood:  Euthymic  Affect:  Congruent  Thought Process:  Goal Directed, Linear and Descriptions of Associations: Intact  Orientation:  Full (Time, Place, and Person)  Thought Content: Logical   Suicidal Thoughts:  No  Homicidal Thoughts:  No  Memory:  Recent;   Good Remote;   Good  Judgement:  Good  Insight:  Good  Psychomotor Activity:  Normal  Concentration:  Concentration: Good and Attention Span: Good  Recall:  Good  Fund of Knowledge: Good  Language: Good  Akathisia:  Negative  Handed:  Right  AIMS (if indicated): not done  Assets:  Communication Skills Desire for Improvement Financial Resources/Insurance Housing  ADL's:  Intact  Cognition: WNL  Sleep:  Fair     Screenings: PHQ2-9     Office Visit from 05/12/2019 in [facility] Visit from 02/17/2019 in [facility] Visit from 01/06/2019 in [facility] Visit from 07/14/2018 in [facility] Visit from 03/12/2017 in [facility]  PHQ-2 Total Score  5  2  6  2  6   PHQ-9 Total Score  17  4  19  5  18        Assessment and Plan: Patient reported significant improvement in her mood after starting Lamictal.  She did complain that the medicine seems to wear off late in the evening and asked if the dose could be adjusted.  She also reported having difficulty in staying asleep.  Patient was agreeable to increasing the dose of Lamictal 200 mg for optimal effect.  She was agreeable to restarting low-dose trazodone to help with insomnia.  1. Bipolar 2 disorder (HCC)  -Increase lamoTRIgine (LAMICTAL) 100 MG tablet; Take 1 tablet (100  mg total) by mouth daily.  Dispense: 30 tablet; Refill: 1 -Start traZODone (DESYREL) 50 MG tablet; Take 1 tablet (50 mg total) by mouth at bedtime as needed for sleep.  Dispense: 30 tablet; Refill: 1   Follow-up in 2 months.   Nevada Crane, MD 12/14/2019, 3:24 PM

## 2019-12-20 ENCOUNTER — Telehealth: Payer: Self-pay | Admitting: Gastroenterology

## 2019-12-20 NOTE — Telephone Encounter (Signed)
Patient called states she is having a diverticulitis flare up and would like antibiotics called in

## 2019-12-20 NOTE — Telephone Encounter (Signed)
Patient not seen since 2018.  She is scheduled for an office visit for tomorrow at 9:00 with Nicoletta Ba PA

## 2019-12-21 ENCOUNTER — Encounter: Payer: Self-pay | Admitting: Physician Assistant

## 2019-12-21 ENCOUNTER — Ambulatory Visit (INDEPENDENT_AMBULATORY_CARE_PROVIDER_SITE_OTHER): Payer: BC Managed Care – PPO | Admitting: Physician Assistant

## 2019-12-21 VITALS — BP 112/64 | HR 83 | Temp 97.8°F | Ht 61.0 in | Wt 172.8 lb

## 2019-12-21 DIAGNOSIS — K5732 Diverticulitis of large intestine without perforation or abscess without bleeding: Secondary | ICD-10-CM | POA: Diagnosis not present

## 2019-12-21 MED ORDER — CIPROFLOXACIN HCL 500 MG PO TABS: 500.0000 mg | ORAL_TABLET | Freq: Two times a day (BID) | ORAL | 0 refills | Status: DC

## 2019-12-21 NOTE — Progress Notes (Signed)
Subjective:    Patient ID: Krystal Blackwell, female    DOB: 12-Feb-1981, 39 y.o.   MRN: [id number]  HPI Krystal Blackwell is a pleasant 39 year old white female, established with Dr. Fuller Plan.  She comes in today with complaint of left lower quadrant pain.  Patient was last seen in 2018 with similar symptoms and treated for diverticulitis. She has had a total of 4 prior episodes of diverticulitis, none requiring hospitalization. She says she has not had any problems since she was seen here in 2018, until onset yesterday morning of left lower quadrant pain that was very reminiscent of prior episodes of diverticulitis.  She says this episode is not as bad but she wanted to get on top of it quickly.  She has not had any fever or chills.  She had been a bit constipated prior to onset and took a couple of doses of MiraLAX last evening.  She has had bowel movements and feels a little bit better.  She has not noted any melena or hematochezia.  She has no dysuria. She says she would like to avoid Cipro and Flagyl as that combination has made her feel sick in the past. Patient had colonoscopy January 2018 with finding of multiple small diverticuli in the descending and sigmoid colon.  Review of Systems Pertinent positive and negative review of systems were noted in the above HPI section.  All other review of systems was otherwise negative.  Outpatient Encounter Medications as of 12/21/2019  Medication Sig  . etonogestrel-ethinyl estradiol (NUVARING) 0.12-0.015 MG/24HR vaginal ring Insert vaginally and leave in place for 3 consecutive weeks, then remove for 1 week.  . lamoTRIgine (LAMICTAL) 100 MG tablet Take 1 tablet (100 mg total) by mouth daily.  . Probiotic Product (SOLUBLE FIBER/PROBIOTICS PO) Take 1 tablet by mouth 1 day or 1 dose.  . traZODone (DESYREL) 50 MG tablet Take 1 tablet (50 mg total) by mouth at bedtime as needed for sleep.  . ciprofloxacin (CIPRO) 500 MG tablet Take 1 tablet (500 mg total) by mouth 2  (two) times daily for 7 days.  . [DISCONTINUED] amoxicillin-clavulanate (AUGMENTIN) 875-125 MG tablet Take 1 tablet by mouth 2 (two) times daily. (Patient not taking: Reported on 12/21/2019)   No facility-administered encounter medications on file as of 12/21/2019.   Allergies  Allergen Reactions  . Abilify [Aripiprazole]     Made jaw twitch  . Avelox [Moxifloxacin Hcl In Nacl]    Patient Active Problem List   Diagnosis Date Noted  . Bipolar 2 disorder ([place]) 11/02/2019  . Diverticulitis of colon 06/16/2016  . 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 Vitamin D deficiency 06/19/2014  . Depression with anxiety    Social History   Socioeconomic History  . Marital status: Legally Separated    Spouse name: Not on file  . Number of children: 2  . Years of education: Not on file  . Highest education level: Not on file  Occupational History  . Occupation: EC Teacher  Tobacco Use  . Smoking status: Current Some Day Smoker  . Smokeless tobacco: Never Used  . Tobacco comment: 2-3 CIGARETTES EVERY TWO WEEKS  Substance and Sexual Activity  . Alcohol use: Not Currently    Alcohol/week: 6.0 standard drinks    Types: 6 Cans of beer per week    Comment: SOCIALLY  . Drug use: No  . Sexual activity: Yes    Birth control/protection: None, Other-see comments, Inserts  Other Topics Concern  . Not on file  Social History  Narrative   Entered 06/2014:   Has 2 children--both are boys--one is 55 y/o. The other is "teenager"--not sure exact age.   At home --it is her and her 2 sons.   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   Social Determinants of Health   Financial Resource Strain:   . Difficulty of Paying Living Expenses:   Food Insecurity:   . Worried About Charity fundraiser in the Last Year:   . Arboriculturist in the Last Year:   Transportation Needs:   . Film/video editor (Medical):   Marland Kitchen Lack of Transportation (Non-Medical):   Physical Activity:   .  Days of Exercise per Week:   . Minutes of Exercise per Session:   Stress:   . Feeling of Stress :   Social Connections:   . Frequency of Communication with Friends and Family:   . Frequency of Social Gatherings with Friends and Family:   . Attends Religious Services:   . Active Member of Clubs or Organizations:   . Attends Archivist Meetings:   Marland Kitchen Marital Status:   Intimate Partner Violence:   . Fear of Current or Ex-Partner:   . Emotionally Abused:   Marland Kitchen Physically Abused:   . Sexually Abused:     Ms. Blase family history includes Alcohol abuse in her father; Arthritis in her maternal grandmother; Asthma in her mother; Drug abuse in her father; Hypertension in her mother; Miscarriages / Stillbirths in her mother; Pancreatic cancer in her father.      Objective:    Vitals:   12/21/19 0902  BP: 112/64  Pulse: 83  Temp: 97.8 F (36.6 C)  SpO2: 99%    Physical Exam Well-developed well-nourished WF  in no acute distress.  Height, Weight,172 BMI 32.6  HEENT; nontraumatic normocephalic, EOMI, PER R LA, sclera anicteric. Oropharynx; not examined today Neck; supple, no JVD Cardiovascular; regular rate and rhythm with S1-S2, no murmur rub or gallop Pulmonary; Clear bilaterally Abdomen; soft, there is tenderness in the left lower quadrant, no guarding or rebound,, nondistended, no palpable mass or hepatosplenomegaly, bowel sounds are active Rectal; not done today Skin; benign exam, no jaundice rash or appreciable lesions Extremities; no clubbing cyanosis or edema skin warm and dry Neuro/Psych; alert and oriented x4, grossly nonfocal mood and affect appropriate       Assessment & Plan:   #66 39 year old white female with prior history of diverticulitis, last episode 2018 who presents with 2-day history of recurrent left lower quadrant pain, similar to prior episodes of diverticulitis though not as severe by patient report.  Plan; start Cipro 500 mg p.o. twice  daily x7 days, take with food. Continue MiraLAX 17 g in 8 ounces of water daily for the next few days until bowel movements more regular. In general for constipation we discussed higher fiber diet and increase in water intake to at least 60 ounces per day. Patient was advised to call and asked to speak to my nurse if she develops any worsening of symptoms or if symptoms fail to resolve when she completes the course of antibiotics.   Alene Bergerson S Jarry Manon PA-C 12/21/2019   Cc: Orlena Sheldon, PA-C

## 2019-12-21 NOTE — Progress Notes (Signed)
Reviewed and agree with management plan.  Samara Stankowski T. Nathalee Smarr, MD FACG [facility]  

## 2019-12-21 NOTE — Patient Instructions (Addendum)
If you are age 40 or older, your body mass index should be between 23-30. Your Body mass index is 32.65 kg/m. If this is out of the aforementioned range listed, please consider follow up with your Primary Care Provider.  If you are age 48 or younger, your body mass index should be between 19-25. Your Body mass index is 32.65 kg/m. If this is out of the aformentioned range listed, please consider follow up with your Primary Care Provider.   START Cipro 500 mg 1 tablet twice daily for 14 days.  START Miralax 1 capful in 8 ounces of juice or water daily.  Be sure to increase water intake to at least 60 ounces daily.  Call the office if symptoms do not resolve or worsen after you complete your antibiotic.  Follow up as needed.

## 2019-12-27 ENCOUNTER — Telehealth: Payer: Self-pay | Admitting: Physician Assistant

## 2019-12-27 ENCOUNTER — Other Ambulatory Visit: Payer: Self-pay

## 2019-12-27 ENCOUNTER — Emergency Department
Admission: EM | Admit: 2019-12-27 | Discharge: 2019-12-27 | Disposition: A | Discharge status: Home / Self Care | Payer: BC Managed Care – PPO | Attending: Emergency Medicine | Admitting: Emergency Medicine

## 2019-12-27 ENCOUNTER — Encounter: Payer: Self-pay | Admitting: Emergency Medicine

## 2019-12-27 ENCOUNTER — Emergency Department: Payer: BC Managed Care – PPO

## 2019-12-27 DIAGNOSIS — K5793 Diverticulitis of intestine, part unspecified, without perforation or abscess with bleeding: Secondary | ICD-10-CM | POA: Diagnosis not present

## 2019-12-27 DIAGNOSIS — F17210 Nicotine dependence, cigarettes, uncomplicated: Secondary | ICD-10-CM | POA: Insufficient documentation

## 2019-12-27 DIAGNOSIS — E860 Dehydration: Secondary | ICD-10-CM | POA: Insufficient documentation

## 2019-12-27 DIAGNOSIS — Z79899 Other long term (current) drug therapy: Secondary | ICD-10-CM | POA: Diagnosis not present

## 2019-12-27 DIAGNOSIS — R109 Unspecified abdominal pain: Secondary | ICD-10-CM | POA: Diagnosis present

## 2019-12-27 DIAGNOSIS — K5792 Diverticulitis of intestine, part unspecified, without perforation or abscess without bleeding: Secondary | ICD-10-CM

## 2019-12-27 LAB — COMPREHENSIVE METABOLIC PANEL
ALT: 24 U/L (ref 0–44)
AST: 26 U/L (ref 15–41)
Albumin: 4.1 g/dL (ref 3.5–5.0)
Alkaline Phosphatase: 59 U/L (ref 38–126)
Anion gap: 10 (ref 5–15)
BUN: 8 mg/dL (ref 6–20)
CO2: 21 mmol/L — ABNORMAL LOW (ref 22–32)
Calcium: 8.9 mg/dL (ref 8.9–10.3)
Chloride: 104 mmol/L (ref 98–111)
Creatinine, Ser: 0.58 mg/dL (ref 0.44–1.00)
GFR calc Af Amer: >60 mL/min (ref >60)
GFR calc non Af Amer: >60 mL/min (ref >60)
Glucose, Bld: 99 mg/dL (ref 70–99)
Potassium: 4.1 mmol/L (ref 3.5–5.1)
Sodium: 135 mmol/L (ref 135–145)
Total Bilirubin: 1 mg/dL (ref 0.3–1.2)
Total Protein: 7.3 g/dL (ref 6.5–8.1)

## 2019-12-27 LAB — URINALYSIS, COMPLETE (UACMP) WITH MICROSCOPIC
Bacteria, UA: NONE SEEN
Bilirubin Urine: NEGATIVE
Glucose, UA: NEGATIVE mg/dL
Ketones, ur: NEGATIVE mg/dL
Leukocytes,Ua: NEGATIVE
Nitrite: NEGATIVE
Protein, ur: NEGATIVE mg/dL
Specific Gravity, Urine: >1.046 — ABNORMAL HIGH (ref 1.005–1.030)
pH: 6 (ref 5.0–8.0)

## 2019-12-27 LAB — POCT PREGNANCY, URINE: Preg Test, Ur: NEGATIVE

## 2019-12-27 LAB — CBC
HCT: 40 % (ref 36.0–46.0)
Hemoglobin: 13.6 g/dL (ref 12.0–15.0)
MCH: 31.9 pg (ref 26.0–34.0)
MCHC: 34 g/dL (ref 30.0–36.0)
MCV: 93.7 fL (ref 80.0–100.0)
Platelets: 434 10*3/uL — ABNORMAL HIGH (ref 150–400)
RBC: 4.27 MIL/uL (ref 3.87–5.11)
RDW: 12.7 % (ref 11.5–15.5)
WBC: 6.3 10*3/uL (ref 4.0–10.5)
nRBC: 0 % (ref 0.0–0.2)

## 2019-12-27 LAB — LIPASE, BLOOD: Lipase: 22 U/L (ref 11–51)

## 2019-12-27 MED ORDER — MORPHINE SULFATE (PF) 4 MG/ML IV SOLN
4.0000 mg | Freq: Once | INTRAVENOUS | Status: AC
  Administered 2019-12-27: 4 mg via INTRAVENOUS
  Filled 2019-12-27: qty 1

## 2019-12-27 MED ORDER — HYDROCODONE-ACETAMINOPHEN 5-325 MG PO TABS: 1.0000 | ORAL_TABLET | Freq: Four times a day (QID) | ORAL | 0 refills | Status: DC | PRN

## 2019-12-27 MED ORDER — ONDANSETRON HCL 4 MG/2ML IJ SOLN
4.0000 mg | Freq: Once | INTRAMUSCULAR | Status: AC
  Administered 2019-12-27: 4 mg via INTRAVENOUS
  Filled 2019-12-27: qty 2

## 2019-12-27 MED ORDER — ONDANSETRON 4 MG PO TBDP
4.0000 mg | ORAL_TABLET | Freq: Three times a day (TID) | ORAL | 0 refills | Status: DC | PRN
Start: 2019-12-27 — End: 2020-10-10

## 2019-12-27 MED ORDER — IOHEXOL 300 MG/ML  SOLN
100.0000 mL | Freq: Once | INTRAMUSCULAR | Status: AC | PRN
  Administered 2019-12-27: 100 mL via INTRAVENOUS
  Filled 2019-12-27: qty 100

## 2019-12-27 MED ORDER — LEVOFLOXACIN 750 MG PO TABS
750.0000 mg | ORAL_TABLET | Freq: Every day | ORAL | 0 refills | Status: AC
Start: 2019-12-27 — End: 2020-01-01

## 2019-12-27 MED ORDER — LACTATED RINGERS IV BOLUS
1000.0000 mL | Freq: Once | INTRAVENOUS | Status: AC
  Administered 2019-12-27: 1000 mL via INTRAVENOUS

## 2019-12-27 NOTE — ED Provider Notes (Signed)
[facility] Emergency Department Provider Note  ____________________________________________   First MD Initiated Contact with Patient 12/27/19 [phone]     (approximate)  I have reviewed the triage vital signs and the nursing notes.   HISTORY  Chief Complaint Abdominal Pain and Nausea    HPI Krystal Blackwell is a 39 y.o. female  With h/o bipolar disorder, diverticulitis, here with abdominal pain. Pt reports nearly one week of abdominal pain, nausea, and initially diarrhea. She saw her PCP who put her on cipro (no flagyl 2/2 intolerance), and she had some mild improvement though over the past several days has had worsening LLQ pain. Pain is aching, gnawing, worse with palpation. She now has some mild constipation. Has had poor appetite. No fevers. No CP, cough. No urinary sx. No vaginal bleeding or discharge.        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 Authorizing Provider  etonogestrel-ethinyl estradiol (NUVARING) 0.12-0.015 MG/24HR vaginal ring Insert vaginally and leave in place for 3 consecutive weeks, then remove for 1 week. 01/06/19   Shambley, Melody N, CNM  HYDROcodone-acetaminophen (NORCO/VICODIN) 5-325 MG tablet Take 1-2 tablets by mouth every 6 (six) hours as needed for moderate pain or severe pain.  12/27/19 12/26/20  Duffy Bruce, MD  lamoTRIgine (LAMICTAL) 100 MG tablet Take 1 tablet (100 mg total) by mouth daily. 12/14/19   [place] Crane, MD  levofloxacin (LEVAQUIN) 750 MG tablet Take 1 tablet (750 mg total) by mouth daily for 5 days. 12/27/19 01/01/20  Duffy Bruce, MD  ondansetron (ZOFRAN ODT) 4 MG disintegrating tablet Take 1 tablet (4 mg total) by mouth every 8 (eight) hours as needed for nausea or vomiting. 12/27/19   Duffy Bruce, MD  Probiotic Product (SOLUBLE FIBER/PROBIOTICS PO) Take 1 tablet by mouth 1 day or 1 dose.    [provider]  traZODone (DESYREL) 50 MG tablet Take 1 tablet (50 mg total) by mouth at bedtime as needed for sleep. 12/14/19   Nevada Crane, MD    Allergies Abilify [aripiprazole] and Avelox [moxifloxacin hcl in nacl]  Family History  Problem Relation Age of Onset   Asthma Mother    Hypertension Mother    Miscarriages / [place] Mother    Alcohol abuse Father    Drug abuse Father    Pancreatic cancer Father    Arthritis Maternal Grandmother    Colon cancer Neg Hx    Stomach cancer Neg Hx    Esophageal cancer Neg Hx    Rectal cancer Neg Hx    Liver cancer Neg Hx     Social History Social History   Tobacco Use   Smoking status: Current Some Day Smoker   Smokeless tobacco: Never Used   Tobacco comment: 2-3 CIGARETTES EVERY  TWO WEEKS  Substance Use Topics   Alcohol use: Not Currently    Alcohol/week: 6.0 standard drinks    Types: 6 Cans of beer per week    Comment: SOCIALLY   Drug use: No    Review of Systems  Review of Systems  Constitutional: Positive for fatigue. Negative for fever.  HENT: Negative for congestion and sore throat.   Eyes: Negative for visual disturbance.  Respiratory: Negative for cough and shortness of breath.   Cardiovascular: Negative for chest pain.  Gastrointestinal: Positive for abdominal pain, constipation and nausea. Negative for diarrhea and vomiting.  Genitourinary: Negative  for flank pain.  Musculoskeletal: Negative for back pain and neck pain.  Skin: Negative for rash and wound.  Neurological: Positive for weakness.  All other systems reviewed and are negative.    ____________________________________________  PHYSICAL EXAM:      VITAL SIGNS: ED Triage Vitals  Enc Vitals Group     BP 12/27/19 0900 (!) 117/93     Pulse Rate 12/27/19 0900 94     Resp 12/27/19 0900 18     Temp 12/27/19 0900 98.5 F (36.9 C)     Temp Source 12/27/19 0900 Oral     SpO2 12/27/19 0900 100 %     Weight 12/27/19 0900 180 lb (81.6 kg)     Height 12/27/19 0900 5\' 1"  (1.549 m)     Head Circumference --      Peak Flow --      Pain Score 12/27/19 0910 6     Pain Loc --      Pain Edu? --      Excl. in [place]? --      Physical Exam Vitals and nursing note reviewed.  Constitutional:      General: She is not in acute distress.    Appearance: She is well-developed.  HENT:     Head: Normocephalic and atraumatic.  Eyes:     Conjunctiva/sclera: Conjunctivae normal.  Cardiovascular:     Rate and Rhythm: Normal rate and regular rhythm.     Heart sounds: Normal heart sounds.  Pulmonary:     Effort: Pulmonary effort is normal. No respiratory distress.     Breath sounds: No wheezing.  Abdominal:     General: There is no distension.     Tenderness: There is abdominal tenderness in the periumbilical area and left lower quadrant.  Musculoskeletal:     Cervical back: Neck supple.  Skin:    General: Skin is warm.     Capillary Refill: Capillary refill takes less than 2 seconds.     Findings: No rash.  Neurological:     Mental Status: She is alert and oriented to person, place, and time.     Motor: No abnormal muscle tone.       ____________________________________________   LABS (all labs ordered are listed, but only abnormal results are displayed)  Labs Reviewed  COMPREHENSIVE METABOLIC PANEL - Abnormal; Notable for the following components:      Result Value   CO2  21 (*)    All other components within normal limits  CBC - Abnormal; Notable for the following components:   Platelets 434 (*)    All other components within normal limits  URINALYSIS, COMPLETE (UACMP) WITH MICROSCOPIC - Abnormal; Notable for the following components:   Color, Urine STRAW (*)    APPearance HAZY (*)    Specific Gravity, Urine >1.046 (*)    Hgb urine dipstick SMALL (*)  All other components within normal limits  LIPASE, BLOOD  POCT PREGNANCY, URINE    ____________________________________________  EKG: None ________________________________________  RADIOLOGY All imaging, including plain films, CT scans, and ultrasounds, independently reviewed by me, and interpretations confirmed via formal radiology reads.  ED MD interpretation:   CT A/P: No acute abnormality  Official radiology report(s): CT ABDOMEN PELVIS W CONTRAST  Result Date: 12/27/2019 CLINICAL DATA:  Acute left lower quadrant abdominal pain. EXAM: CT ABDOMEN AND PELVIS WITH CONTRAST TECHNIQUE: Multidetector CT imaging of the abdomen and pelvis was performed using the standard protocol following bolus administration of intravenous contrast. CONTRAST:  136mL OMNIPAQUE IOHEXOL 300 MG/ML  SOLN COMPARISON:  June 01, 2017. FINDINGS: Lower chest: No acute abnormality. Hepatobiliary: No focal liver abnormality is seen. No gallstones, gallbladder wall thickening, or biliary dilatation. Pancreas: Unremarkable. No pancreatic ductal dilatation or surrounding inflammatory changes. Spleen: Normal in size without focal abnormality. Adrenals/Urinary Tract: Adrenal glands are unremarkable. Kidneys are normal, without renal calculi, focal lesion, or hydronephrosis. Bladder is unremarkable. Stomach/Bowel: Stomach is within normal limits. Appendix appears normal. No evidence of bowel wall thickening, distention, or inflammatory changes. Vascular/Lymphatic: No significant vascular findings are present. No enlarged abdominal or  pelvic lymph nodes. Reproductive: Uterus and bilateral adnexa are unremarkable. Other: No abdominal wall hernia or abnormality. No abdominopelvic ascites. Musculoskeletal: No acute or significant osseous findings. IMPRESSION: No abnormality seen in the abdomen or pelvis. Electronically Signed   By: Marijo Conception M.D.   On: 12/27/2019 11:58    ____________________________________________  PROCEDURES   Procedure(s) performed (including Critical Care):  Procedures  ____________________________________________  INITIAL IMPRESSION / MDM / [place] / ED COURSE  As part of my medical decision making, I reviewed the following data within the [place] notes reviewed and incorporated, Old chart reviewed, Notes from prior ED visits, and [place] Controlled Substance Database       *DARIANNA AGNE was evaluated in Emergency Department on 12/27/2019 for the symptoms described in the history of present illness. She was evaluated in the context of the global COVID-19 pandemic, which necessitated consideration that the patient might be at risk for infection with the SARS-CoV-2 virus that causes COVID-19. Institutional protocols and algorithms that pertain to the evaluation of patients at risk for COVID-19 are in a state of rapid change based on information released by regulatory bodies including the CDC and federal and state organizations. These policies and algorithms were followed during the patient's care in the ED.  Some ED evaluations and interventions may be delayed as a result of limited staffing during the pandemic.*     Medical Decision Making:  39 yo F here with abdominal pain, nausea, LLQ pain. Pt concerned about complicated diverticulitis which is reasonable given her history. Her abdomen is soft, NT, ND but given duration of sx and history, CT obtained which fortunately is negative. Labs are also reassuring with normal WBC, normal renal function and LFTs. UA  with mild dehydration but no signs of UTI. No other acute abnormality noted on her CT. While I suspect her sx are GI related and possibly from a partially treated diverticulitis, she is concerned that the cipro is not strong enough. She is adamant that she will not tolerate flagyl. I am hesitant to add Clinda given reassuring exam and risk of C. Diff. I feel the most reasonable option is continuation of current course but switching to levaquin for slightly broader anaerobic coverage and once-daily dosing for better tolerance, to  complete a total 7 day course. Return precautions given.  ____________________________________________  FINAL CLINICAL IMPRESSION(S) / ED DIAGNOSES  Final diagnoses:  Dehydration  Diverticulitis     MEDICATIONS GIVEN DURING THIS VISIT:  Medications  lactated ringers bolus 1,000 mL (0 mLs Intravenous Stopped 12/27/19 1351)  morphine 4 MG/ML injection 4 mg (4 mg Intravenous Given 12/27/19 1101)  ondansetron (ZOFRAN) injection 4 mg (4 mg Intravenous Given 12/27/19 1101)  iohexol (OMNIPAQUE) 300 MG/ML solution 100 mL (100 mLs Intravenous Contrast Given 12/27/19 1115)     ED Discharge Orders         Ordered    levofloxacin (LEVAQUIN) 750 MG tablet  Daily     12/27/19 1336    HYDROcodone-acetaminophen (NORCO/VICODIN) 5-325 MG tablet  Every 6 hours PRN     12/27/19 1336    ondansetron (ZOFRAN ODT) 4 MG disintegrating tablet  Every 8 hours PRN     12/27/19 1336    clindamycin (CLEOCIN) 300 MG capsule  3 times daily     Pending           Note:  This document was prepared using Dragon voice recognition software and may include unintentional dictation errors.   Duffy Bruce, MD 12/27/19 1558

## 2019-12-27 NOTE — Telephone Encounter (Signed)
Patient stating she had diverticulitis and has been taking antibiotics however she woke up this morning with a lot of pain doesn't know if she should go to ED

## 2019-12-27 NOTE — Discharge Instructions (Signed)
Stop your Cipro and switch to Levaquin.  While these are similar antibiotics, Levaquin has better anaerobic coverage for your diverticulitis.  The goal would be to complete a 7-day course.  If you have taken the Cipro for several days, just take enough of the Levaquin to complete a 7-day course.  I have given you a full 5-day course which would be adequate on its own.  Typically, I would recommend Flagyl but given that you cannot tolerate this, I am hesitant to give clindamycin as your CT scan looked reassuring.  Take the prescribed pain medication and nausea medicine.  Take Colace, a stool softener, twice a day for the next several days.

## 2019-12-27 NOTE — Telephone Encounter (Signed)
Patient is in the [facility] ED presently. Labs and imaging ordered.

## 2019-12-27 NOTE — Telephone Encounter (Signed)
Ok, thanks for update 

## 2019-12-27 NOTE — ED Triage Notes (Signed)
Pt reports hx of diverticulitis and is having a flare up. Pt reports it feels the same as it always does with the pain in her left lower abd.

## 2019-12-27 NOTE — ED Notes (Signed)
Pt c/o LLQ abd pain since Tuesday. Pt states that she was started on Cipro Wednesday. Pt denies diarrhea. Pt denies vomiting. Pt reports pain got some better with Cipro but has been worse since last night. Pt denies fevers but states that she has had chills.

## 2020-02-10 ENCOUNTER — Telehealth: Payer: BC Managed Care – PPO | Admitting: Psychiatry

## 2020-02-10 ENCOUNTER — Telehealth (HOSPITAL_COMMUNITY): Payer: BC Managed Care – PPO | Admitting: Psychiatry

## 2020-02-10 ENCOUNTER — Other Ambulatory Visit: Payer: Self-pay

## 2020-02-14 ENCOUNTER — Other Ambulatory Visit: Payer: Self-pay

## 2020-02-14 ENCOUNTER — Encounter (HOSPITAL_COMMUNITY): Payer: Self-pay | Admitting: Psychiatry

## 2020-02-14 ENCOUNTER — Telehealth (INDEPENDENT_AMBULATORY_CARE_PROVIDER_SITE_OTHER): Payer: BC Managed Care – PPO | Admitting: Psychiatry

## 2020-02-14 DIAGNOSIS — F3181 Bipolar II disorder: Secondary | ICD-10-CM

## 2020-02-14 MED ORDER — LAMOTRIGINE 100 MG PO TABS: 100.0000 mg | ORAL_TABLET | Freq: Every day | ORAL | 1 refills | Status: DC

## 2020-02-14 MED ORDER — BUPROPION HCL ER (XL) 150 MG PO TB24: 150.0000 mg | ORAL_TABLET | ORAL | 1 refills | Status: DC

## 2020-02-14 MED ORDER — TRAZODONE HCL 50 MG PO TABS: 50.0000 mg | ORAL_TABLET | Freq: Every evening | ORAL | 1 refills | Status: DC | PRN

## 2020-02-14 NOTE — Progress Notes (Signed)
[facility] MD/PA/NP OP Progress Note  Virtual Visit via Video Note  I connected with Krystal Blackwell on 02/14/20 at  9:30 AM EDT by a video enabled telemedicine application and verified that I am speaking with the correct person using two identifiers.  Location: Patient: Home Provider: Clinic   I discussed the limitations of evaluation and management by telemedicine and the availability of in person appointments. The patient expressed understanding and agreed to proceed.  I provided 17 minutes of non-face-to-face time during this encounter.    02/14/2020 9:53 AM Krystal Blackwell  MRN:  [id number]  Chief Complaint:  " I feel sad and down on some days."  HPI: Patient reported that she was feeling well for a few weeks however for the past few days she has been feeling really down with low energy levels.  She stated that she feels sad and does not feel like doing anything.  She has low motivation levels.  She is unable to stay on task and gets easily distracted.  She reported that her sleep is improved after she started taking trazodone.  She does not take it every night and only takes it as needed. She was agreeable to trying Wellbutrin to help her depressed mood and low energy levels. Potential side effects of medication and risks vs benefits of treatment vs non-treatment were explained and discussed. All questions were answered.   Visit Diagnosis:    ICD-10-CM   1. Bipolar 2 disorder ([place])  F31.81     Past Psychiatric History: Depression, anxiety  Past Medical History:  Past Medical History:  Diagnosis Date   Blood transfusion without reported diagnosis    [id number]   Depression    Diverticulitis    Diverticulosis    Ovarian cyst    Pelvic pain in female    Recurrent sinus infections    Scoliosis     Past Surgical History:  Procedure Laterality Date   NASAL SEPTUM SURGERY  11/2013   SPINE SURGERY  97,98,99,00   scoliosis    Family Psychiatric History:  Mother-anxiety, depression, 9 year old son diagnosed with bipolar disorder    Family History:  Family History  Problem Relation Age of Onset   Asthma Mother    Hypertension Mother    Miscarriages / [place] Mother    Alcohol abuse Father    Drug abuse Father    Pancreatic cancer Father    Arthritis Maternal Grandmother    Colon cancer Neg Hx    Stomach cancer Neg Hx    Esophageal cancer Neg Hx    Rectal cancer Neg Hx    Liver cancer Neg Hx     Social History:  Social History   Socioeconomic History   Marital status: Legally Separated    Spouse name: Not on file   Number of children: 2   Years of education: Not on file   Highest education level: Not on file  Occupational History   Occupation: EC Teacher  Tobacco Use   Smoking status: Current Some Day Smoker   Smokeless tobacco: Never Used   Tobacco comment: 2-3 CIGARETTES EVERY TWO WEEKS  Vaping Use   Vaping Use: Never used  Substance and Sexual Activity   Alcohol use: Not Currently    Alcohol/week: 6.0 standard drinks    Types: 6 Cans of beer per week    Comment: SOCIALLY   Drug use: No   Sexual activity: Yes    Birth control/protection: None, Other-see comments, Inserts  Other Topics Concern  Not on file  Social History Narrative   Entered 06/2014:   Has 2 children--both are boys--one is 68 y/o. The other is "teenager"--not sure exact age.   At home --it is her and her 2 sons.   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   Social Determinants of Health   Financial Resource Strain:    Difficulty of Paying Living Expenses:   Food Insecurity:    Worried About Charity fundraiser in the Last Year:    Arboriculturist in the Last Year:   Transportation Needs:    Film/video editor (Medical):    Lack of Transportation (Non-Medical):   Physical Activity:    Days of Exercise per Week:    Minutes of Exercise per Session:   Stress:    Feeling of  Stress :   Social Connections:    Frequency of Communication with Friends and Family:    Frequency of Social Gatherings with Friends and Family:    Attends Religious Services:    Active Member of Clubs or Organizations:    Attends Archivist Meetings:    Marital Status:     Allergies:  Allergies  Allergen Reactions   Abilify [Aripiprazole]     Made jaw twitch   Avelox [Moxifloxacin Hcl In Nacl]     Metabolic Disorder Labs: Lab Results  Component Value Date   HGBA1C 4.9 11/12/2017   No results found for: PROLACTIN Lab Results  Component Value Date   CHOL 200 (H) 08/17/2015   TRIG 118 08/17/2015   HDL 84 08/17/2015   CHOLHDL 2.4 08/17/2015   LDLCALC 92 08/17/2015   Lab Results  Component Value Date   TSH 1.560 11/12/2017   TSH 2.160 08/17/2015    Therapeutic Level Labs: No results found for: LITHIUM No results found for: VALPROATE No components found for:  CBMZ  Current Medications: Current Outpatient Medications  Medication Sig Dispense Refill   etonogestrel-ethinyl estradiol (NUVARING) 0.12-0.015 MG/24HR vaginal ring Insert vaginally and leave in place for 3 consecutive weeks, then remove for 1 week. 1 each 12   HYDROcodone-acetaminophen (NORCO/VICODIN) 5-325 MG tablet Take 1-2 tablets by mouth every 6 (six) hours as needed for moderate pain or severe pain. 12 tablet 0   lamoTRIgine (LAMICTAL) 100 MG tablet Take 1 tablet (100 mg total) by mouth daily. 30 tablet 1   ondansetron (ZOFRAN ODT) 4 MG disintegrating tablet Take 1 tablet (4 mg total) by mouth every 8 (eight) hours as needed for nausea or vomiting. 12 tablet 0   Probiotic Product (SOLUBLE FIBER/PROBIOTICS PO) Take 1 tablet by mouth 1 day or 1 dose.     traZODone (DESYREL) 50 MG tablet Take 1 tablet (50 mg total) by mouth at bedtime as needed for sleep. 30 tablet 1   No current facility-administered medications for this visit.     Musculoskeletal: Strength & Muscle Tone: unable  to assess due to telemed visit Gait & Station: unable to assess due to telemed visit Patient leans: unable to assess due to telemed visit   Psychiatric Specialty Exam: Review of Systems  There were no vitals taken for this visit.There is no height or weight on file to calculate BMI.  General Appearance: Well Groomed  Eye Contact:  Good  Speech:  Clear and Coherent and Normal Rate  Volume:  Normal  Mood:  Depressed  Affect:  Congruent  Thought Process:  Goal Directed, Linear and Descriptions of Associations: Intact  Orientation:  Full (Time, Place, and Person)  Thought Content: Logical   Suicidal Thoughts:  No  Homicidal Thoughts:  No  Memory:  Recent;   Good Remote;   Good  Judgement:  Good  Insight:  Good  Psychomotor Activity:  Normal  Concentration:  Concentration: Good and Attention Span: Good  Recall:  Good  Fund of Knowledge: Good  Language: Good  Akathisia:  Negative  Handed:  Right  AIMS (if indicated): not done  Assets:  Communication Skills Desire for Improvement Financial Resources/Insurance Housing  ADL's:  Intact  Cognition: WNL  Sleep:  Good with help of Trazodone     Screenings: PHQ2-9     Office Visit from 05/12/2019 in [facility] Visit from 02/17/2019 in [facility] Visit from 01/06/2019 in [facility] Visit from 07/14/2018 in [facility] Visit from 03/12/2017 in [facility]  PHQ-2 Total Score 5 2 6 2 6   PHQ-9 Total Score 17 4 19 5 18        Assessment and Plan: Patient complained of depressed mood and low energy levels and was agreeable to adding a low-dose antidepressant namely Wellbutrin to her regimen for improvement of symptoms. Potential side effects of medication and risks vs benefits of treatment vs non-treatment were explained and discussed. All questions were answered.   1. Bipolar 2 disorder ([facility])  - lamoTRIgine (LAMICTAL) 100 MG tablet; Take 1 tablet (100 mg  total) by mouth daily.  Dispense: 30 tablet; Refill: 1 - traZODone (DESYREL) 50 MG tablet; Take 1 tablet (50 mg total) by mouth at bedtime as needed for sleep.  Dispense: 30 tablet; Refill: 1 - Start buPROPion (WELLBUTRIN XL) 150 MG 24 hr tablet; Take 1 tablet (150 mg total) by mouth every morning.  Dispense: 30 tablet; Refill: 1    Follow-up in 2 months.   [place] Crane, MD 02/14/2020, 9:53 AM

## 2020-03-09 ENCOUNTER — Telehealth (HOSPITAL_COMMUNITY): Payer: Self-pay | Admitting: *Deleted

## 2020-03-09 NOTE — Telephone Encounter (Signed)
Opened an encounter in error, no further documentation required.

## 2020-03-09 NOTE — Telephone Encounter (Signed)
Krystal Blackwell after consulting with NP who recommended she titrate up on her Lamictal starting with 25 mg for one week then 50 mg for one week and then contacting Dr Toy Care. Alerted her to any rash she should develop to stop Lamictal immediately and go to an ED. She verbalized her understanding of the instructions.

## 2020-03-09 NOTE — Telephone Encounter (Signed)
Issue resolved, no further documentation required.

## 2020-03-09 NOTE — Telephone Encounter (Signed)
VM from patient stating she lapsed on taking her Lamictal for approx three weeks and when she restarted it recently she started it back at 100 mg. Initially when starting it she was given a titration schedule per Dr Toy Care. She is reporting feeling very anxious and has an elevated heart rate but didn't report the number. Will consult with NP as Dr Toy Care is not in the office his pm. And call patient with recommendation.

## 2020-03-12 ENCOUNTER — Telehealth (HOSPITAL_COMMUNITY): Payer: Self-pay | Admitting: *Deleted

## 2020-03-12 MED ORDER — LAMOTRIGINE 25 MG PO TABS
ORAL_TABLET | ORAL | 1 refills | Status: DC
Start: 2020-03-12 — End: 2020-10-10

## 2020-03-12 NOTE — Addendum Note (Signed)
Addended by: [place] Crane on: 03/12/2020 10:59 AM   Modules accepted: Orders

## 2020-03-12 NOTE — Telephone Encounter (Signed)
VM left for writer stating she would like to have a RX of Lamictal for 100 mg now that she has restarted it. She called on Fri and had been off of it for three weeks and then restarted it at 100mg  but was having a lot of anxiety, restlessness and elevated HR. Consulted Eulis Canner NP and she directed her to titrate up but taking 25 mg for a week then 50 mg a week and following up with Dr from there. She only had 100 mg pills so used them by quartering them to take the 25 mg for the weekend but states she is out now. Will refer her concern to Dr Toy Care.

## 2020-03-12 NOTE — Telephone Encounter (Signed)
Refill with titration instructions sent to pharmacy

## 2020-03-12 NOTE — Telephone Encounter (Signed)
Called her back for clarification. She has RX available at the pharmacy for 100 mg but not available to pick up until 8/17. She is asking if she can get a short RX of 25 mg while she titrates back up to 100mg . She is no day 3 of taking 25 mg since she had lapsed on her Lamictal 100 mg for three weeks and when she restarted herself at 100 mg it was too much and she was having a lot of anxiety with it. Will consult with Dr Toy Care re this request.

## 2020-03-29 ENCOUNTER — Encounter: Payer: BC Managed Care – PPO | Admitting: Obstetrics and Gynecology

## 2020-04-12 ENCOUNTER — Telehealth (HOSPITAL_COMMUNITY): Payer: BC Managed Care – PPO | Admitting: Psychiatry

## 2020-04-12 ENCOUNTER — Other Ambulatory Visit: Payer: Self-pay

## 2020-06-11 ENCOUNTER — Emergency Department: Payer: BC Managed Care – PPO

## 2020-06-11 ENCOUNTER — Emergency Department
Admission: EM | Admit: 2020-06-11 | Discharge: 2020-06-11 | Disposition: A | Discharge status: Home / Self Care | Payer: BC Managed Care – PPO | Attending: Emergency Medicine | Admitting: Emergency Medicine

## 2020-06-11 ENCOUNTER — Other Ambulatory Visit: Payer: Self-pay

## 2020-06-11 ENCOUNTER — Encounter: Payer: Self-pay | Admitting: *Deleted

## 2020-06-11 DIAGNOSIS — R519 Headache, unspecified: Secondary | ICD-10-CM | POA: Insufficient documentation

## 2020-06-11 DIAGNOSIS — F17200 Nicotine dependence, unspecified, uncomplicated: Secondary | ICD-10-CM | POA: Diagnosis not present

## 2020-06-11 DIAGNOSIS — I10 Essential (primary) hypertension: Secondary | ICD-10-CM | POA: Diagnosis not present

## 2020-06-11 DIAGNOSIS — G4489 Other headache syndrome: Secondary | ICD-10-CM

## 2020-06-11 DIAGNOSIS — G5601 Carpal tunnel syndrome, right upper limb: Secondary | ICD-10-CM | POA: Diagnosis not present

## 2020-06-11 DIAGNOSIS — R2231 Localized swelling, mass and lump, right upper limb: Secondary | ICD-10-CM | POA: Diagnosis not present

## 2020-06-11 DIAGNOSIS — Z79899 Other long term (current) drug therapy: Secondary | ICD-10-CM | POA: Insufficient documentation

## 2020-06-11 LAB — BASIC METABOLIC PANEL
Anion gap: 9 (ref 5–15)
BUN: 14 mg/dL (ref 6–20)
CO2: 21 mmol/L — ABNORMAL LOW (ref 22–32)
Calcium: 9 mg/dL (ref 8.9–10.3)
Chloride: 105 mmol/L (ref 98–111)
Creatinine, Ser: 0.7 mg/dL (ref 0.44–1.00)
GFR, Estimated: >60 mL/min (ref >60)
Glucose, Bld: 94 mg/dL (ref 70–99)
Potassium: 3.7 mmol/L (ref 3.5–5.1)
Sodium: 135 mmol/L (ref 135–145)

## 2020-06-11 LAB — CBC
HCT: 35.9 % — ABNORMAL LOW (ref 36.0–46.0)
Hemoglobin: 12.3 g/dL (ref 12.0–15.0)
MCH: 32.9 pg (ref 26.0–34.0)
MCHC: 34.3 g/dL (ref 30.0–36.0)
MCV: 96 fL (ref 80.0–100.0)
Platelets: 387 10*3/uL (ref 150–400)
RBC: 3.74 MIL/uL — ABNORMAL LOW (ref 3.87–5.11)
RDW: 12.6 % (ref 11.5–15.5)
WBC: 9.2 10*3/uL (ref 4.0–10.5)
nRBC: 0 % (ref 0.0–0.2)

## 2020-06-11 LAB — TROPONIN I (HIGH SENSITIVITY): Troponin I (High Sensitivity): 3 ng/L (ref <18)

## 2020-06-11 LAB — POCT PREGNANCY, URINE: Preg Test, Ur: NEGATIVE

## 2020-06-11 LAB — POC URINE PREG, ED: Preg Test, Ur: NEGATIVE

## 2020-06-11 MED ORDER — HYDROCHLOROTHIAZIDE 12.5 MG PO CAPS: 12.5000 mg | ORAL_CAPSULE | Freq: Every day | ORAL | 0 refills | Status: DC

## 2020-06-11 MED ORDER — LORAZEPAM 1 MG PO TABS
1.0000 mg | ORAL_TABLET | Freq: Once | ORAL | Status: AC
  Administered 2020-06-11: 1 mg via ORAL
  Filled 2020-06-11: qty 1

## 2020-06-11 NOTE — ED Triage Notes (Addendum)
Pt ambulatory to triage.  Pt reports a headache today.  Pt took tylenol without relief.  Pt also reports blood pressure elevated at school today.  Pt has swelling to hands.  No hx htn.  Pt alert  Speech clear.

## 2020-06-11 NOTE — ED Notes (Signed)
Patient transported to CT 

## 2020-06-11 NOTE — ED Provider Notes (Signed)
Krystal Blackwell  ____________________________________________   First MD Initiated Contact with Patient 06/11/20 1941     (approximate)  I have reviewed the triage vital signs and the nursing notes.   HISTORY  Chief Complaint Headache    HPI Krystal Blackwell is a 39 y.o. female  With h/o bipolar d/o here with headache, high BP, tingling/swelling in her R arm. Pt reports that sx started several weeks ago with intermittent mild frontal headaches, as well as tingling in her R hand. Re: HA - she feels like this is related to stressors. No acute onset/thunderclap onset. NO fever, chills, photophobia. No focal numbness or weakness. Re: tingling - she has a long h/o carpal tunnel, feels it is related to this as well as >10 lb weight gain in past month. She attributes her weight gain to stress @ work, and is not sleeping well either. She states she was having a discussion with someone at school today when she began to feel her HA. Her RN at school checked her BP and it was elevated, so she presents for eval. No known h/o HTN.        Past Medical History:  Diagnosis Date  . Blood transfusion without reported diagnosis    [id number]  . Depression   . Diverticulitis   . Diverticulosis   . Ovarian cyst   . Pelvic pain in female   . Recurrent sinus infections   . Scoliosis     Patient Active Problem List   Diagnosis Date Noted  . Bipolar 2 disorder ([place]) 11/02/2019  . Diverticulitis of colon 06/16/2016  . 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 Vitamin D deficiency 06/19/2014  . Depression with anxiety     Past Surgical History:  Procedure Laterality Date  . NASAL SEPTUM SURGERY  11/2013  . SPINE SURGERY  97,98,99,00   scoliosis    Prior to Admission medications   Medication Sig Start Date End Date Taking? Authorizing Provider  buPROPion (WELLBUTRIN XL) 150 MG 24 hr tablet Take 1 tablet (150 mg total) by mouth every  morning. 02/14/20   [place] Crane, MD  etonogestrel-ethinyl estradiol (NUVARING) 0.12-0.015 MG/24HR vaginal ring Insert vaginally and leave in place for 3 consecutive weeks, then remove for 1 week. 01/06/19   Shambley, Melody N, CNM  hydrochlorothiazide (MICROZIDE) 12.5 MG capsule Take 1 capsule (12.5 mg total) by mouth daily. 06/11/20 07/11/20  Duffy Bruce, MD  HYDROcodone-acetaminophen (NORCO/VICODIN) 5-325 MG tablet Take 1-2 tablets by mouth every 6 (six) hours as needed for moderate pain or severe pain. 12/27/19 12/26/20  Duffy Bruce, MD  lamoTRIgine (LAMICTAL) 100 MG tablet Take 1 tablet (100 mg total) by mouth daily. 02/14/20   [place] Crane, MD  lamoTRIgine (LAMICTAL) 25 MG tablet Take one tab daily for 2 weeks then take 2 tabs daily for 2 weeks then take 3 tabs daily 03/12/20   [place] Crane, MD  ondansetron (ZOFRAN ODT) 4 MG disintegrating tablet Take 1 tablet (4 mg total) by mouth every 8 (eight) hours as needed for nausea or vomiting. 12/27/19   Duffy Bruce, MD  Probiotic Product (SOLUBLE FIBER/PROBIOTICS PO) Take 1 tablet by mouth 1 day or 1 dose.    [provider]  traZODone (DESYREL) 50 MG tablet Take 1 tablet (50 mg total) by mouth at bedtime as needed for sleep. 02/14/20   [place] Crane, MD    Allergies Abilify [aripiprazole] and Avelox [moxifloxacin hcl in nacl]  Family History  Problem Relation Age of  Onset  . Asthma Mother   . Hypertension Mother   . Miscarriages / [place] Mother   . Alcohol abuse Father   . Drug abuse Father   . Pancreatic cancer Father   . Arthritis Maternal Grandmother   . Colon cancer Neg Hx   . Stomach cancer Neg Hx   . Esophageal cancer Neg Hx   . Rectal cancer Neg Hx   . Liver cancer Neg Hx     Social History Social History   Tobacco Use  . Smoking status: Current Some Day Smoker  . Smokeless tobacco: Never Used  . Tobacco comment: 2-3 CIGARETTES EVERY TWO WEEKS  Vaping Use  . Vaping Use: Never used  Substance Use  Topics  . Alcohol use: Not Currently    Alcohol/week: 6.0 standard drinks    Types: 6 Cans of beer per week    Comment: SOCIALLY  . Drug use: No    Review of Systems  Review of Systems  Constitutional: Positive for fatigue. Negative for fever.  HENT: Negative for congestion and sore throat.   Eyes: Negative for visual disturbance.  Respiratory: Negative for cough and shortness of breath.   Cardiovascular: Negative for chest pain.  Gastrointestinal: Negative for abdominal pain, diarrhea, nausea and vomiting.  Genitourinary: Negative for flank pain.  Musculoskeletal: Negative for back pain and neck pain.  Skin: Negative for rash and wound.  Neurological: Positive for numbness and headaches. Negative for weakness.  All other systems reviewed and are negative.    ____________________________________________  PHYSICAL EXAM:      VITAL SIGNS: ED Triage Vitals  Enc Vitals Group     BP 06/11/20 1553 (!) 145/77     Pulse Rate 06/11/20 1553 84     Resp 06/11/20 1553 20     Temp 06/11/20 1553 98.9 F (37.2 C)     Temp Source 06/11/20 1553 Oral     SpO2 06/11/20 1553 98 %     Weight 06/11/20 1550 177 lb (80.3 kg)     Height 06/11/20 1550 5' (1.524 m)     Head Circumference --      Peak Flow --      Pain Score 06/11/20 1550 6     Pain Loc --      Pain Edu? --      Excl. in [place]? --      Physical Exam Vitals and nursing Blackwell reviewed.  Constitutional:      General: She is not in acute distress.    Appearance: She is well-developed.  HENT:     Head: Normocephalic and atraumatic.  Eyes:     Conjunctiva/sclera: Conjunctivae normal.  Cardiovascular:     Rate and Rhythm: Normal rate and regular rhythm.     Heart sounds: Normal heart sounds. No murmur heard.  No friction rub.  Pulmonary:     Effort: Pulmonary effort is normal. No respiratory distress.     Breath sounds: Normal breath sounds. No wheezing or rales.  Abdominal:     General: There is no distension.      Palpations: Abdomen is soft.     Tenderness: There is no abdominal tenderness.  Musculoskeletal:     Cervical back: Neck supple.     Comments: Minimal TTP over R carpal tunnel area and thenar eminence. Strength 5/5 bl UE and LE, with normal grip strength, opposition, abduction/adduction of R hand. Normal sensation distally in median, ulnar, and radial nerve distributions.  Skin:    General: Skin is  warm.     Capillary Refill: Capillary refill takes less than 2 seconds.  Neurological:     Mental Status: She is alert and oriented to person, place, and time.     GCS: GCS eye subscore is 4. GCS verbal subscore is 5. GCS motor subscore is 6.     Cranial Nerves: Cranial nerves are intact. No cranial nerve deficit.     Motor: Motor function is intact. No abnormal muscle tone.     Coordination: Coordination is intact.     Gait: Gait is intact.       ____________________________________________   LABS (all labs ordered are listed, but only abnormal results are displayed)  Labs Reviewed  BASIC METABOLIC PANEL - Abnormal; Notable for the following components:      Result Value   CO2 21 (*)    All other components within normal limits  CBC - Abnormal; Notable for the following components:   RBC 3.74 (*)    HCT 35.9 (*)    All other components within normal limits  TSH  POC URINE PREG, ED  POCT PREGNANCY, URINE  TROPONIN I (HIGH SENSITIVITY)    ____________________________________________  EKG: Normal sinus rhythm, VR 71. PR 112, QRS 96, QTc 417. No acute ST elevations or depressions. No ischemia or infarct. ________________________________________  RADIOLOGY All imaging, including plain films, CT scans, and ultrasounds, independently reviewed by me, and interpretations confirmed via formal radiology reads.  ED MD interpretation:   CXR: CLear, no acute abnormality CT Head: [place]  Official radiology report(s): DG Chest 2 View  Result Date: 06/11/2020 CLINICAL DATA:  Chest  pain EXAM: CHEST - 2 VIEW COMPARISON:  None. FINDINGS: The heart size and mediastinal contours are within normal limits. Both lungs are clear. No pleural effusion. Mild dextroscoliosis of the thoracic spine. IMPRESSION: No acute process in the chest. Electronically Signed   By: Macy Mis M.D.   On: 06/11/2020 16:44   CT Head Wo Contrast  Result Date: 06/11/2020 CLINICAL DATA:  Headache, hemorrhage EXAM: CT HEAD WITHOUT CONTRAST TECHNIQUE: Contiguous axial images were obtained from the base of the skull through the vertex without intravenous contrast. COMPARISON:  Maxillofacial CT 07/04/2017 FINDINGS: Brain: No evidence of acute infarction, hemorrhage, hydrocephalus, extra-axial collection, visible mass lesion or mass effect. Vascular: No hyperdense vessel or unexpected calcification. Skull: No calvarial fracture or suspicious osseous lesion. No scalp swelling or hematoma. Sinuses/Orbits: Paranasal sinuses and mastoid air cells are predominantly clear. Included orbital structures are unremarkable. Other: None IMPRESSION: No acute intracranial abnormality. Electronically Signed   By: Lovena Le M.D.   On: 06/11/2020 20:39    ____________________________________________  PROCEDURES   Procedure(s) performed (including Critical Care):  Procedures  ____________________________________________  INITIAL IMPRESSION / MDM / [place] / ED COURSE  As part of my medical decision making, I reviewed the following data within the [place] notes reviewed and incorporated, Old chart reviewed, Notes from prior ED visits, and [place] Controlled Substance Database       *SHAWNE BULOW was evaluated in Emergency Department on 06/11/2020 for the symptoms described in the history of present illness. She was evaluated in the context of the global COVID-19 pandemic, which necessitated consideration that the patient might be at risk for infection with the SARS-CoV-2 virus  that causes COVID-19. Institutional protocols and algorithms that pertain to the evaluation of patients at risk for COVID-19 are in a state of rapid change based on information released by regulatory bodies including  the [employer] and federal and state organizations. These policies and algorithms were followed during the patient's care in the ED.  Some ED evaluations and interventions may be delayed as a result of limited staffing during the pandemic.*     Medical Decision Making:  39 yo F here with multiple complaints. Suspect many of her sx are complications of recent increased stress and weight gain. Her mild HA, SOB is likely related to HTN - she has been noted as high as 150/100 in ED. She admits to significant stressors and suspect this is cause. No prior h/o HTN. EKG nonischemic, trop neg - doubt ACS, STEMI. Renal function is normal. UPT negative. Re: her hand numbness, she has a known h/o carpal tunnel. DDx includes atypical paresthesias from neck. No apparent emergent pathology.  CT Head reviewed and is negative. No focal neuro deficits. BP elevated but improving.  Given the degree of her HTN and sx, will trial low-dose HCTZ. She will f/u with PCP in a week. Otherwise, return precautions, supportive care.  ____________________________________________  FINAL CLINICAL IMPRESSION(S) / ED DIAGNOSES  Final diagnoses:  Primary hypertension  Other headache syndrome  Carpal tunnel syndrome of right wrist     MEDICATIONS GIVEN DURING THIS VISIT:  Medications  LORazepam (ATIVAN) tablet 1 mg (1 mg Oral Given 06/11/20 2024)     ED Discharge Orders         Ordered    hydrochlorothiazide (MICROZIDE) 12.5 MG capsule  Daily        06/11/20 2058           Blackwell:  This document was prepared using Dragon voice recognition software and may include unintentional dictation errors.   Duffy Bruce, MD 06/11/20 2200

## 2020-06-11 NOTE — Discharge Instructions (Addendum)
Start taking the Hydrochlorothiazide tomorrow  It is very important that you follow-up with your doctor in the next week for repeat labs and check-up

## 2020-06-19 ENCOUNTER — Encounter: Payer: BC Managed Care – PPO | Admitting: Certified Nurse Midwife

## 2020-06-19 DIAGNOSIS — I10 Essential (primary) hypertension: Secondary | ICD-10-CM | POA: Insufficient documentation

## 2020-07-31 DIAGNOSIS — K5730 Diverticulosis of large intestine without perforation or abscess without bleeding: Secondary | ICD-10-CM | POA: Insufficient documentation

## 2020-08-22 ENCOUNTER — Encounter: Payer: BC Managed Care – PPO | Admitting: Obstetrics and Gynecology

## 2020-08-23 ENCOUNTER — Other Ambulatory Visit: Payer: Self-pay

## 2020-08-23 ENCOUNTER — Ambulatory Visit (INDEPENDENT_AMBULATORY_CARE_PROVIDER_SITE_OTHER): Payer: BC Managed Care – PPO | Admitting: Obstetrics and Gynecology

## 2020-08-23 ENCOUNTER — Encounter: Payer: Self-pay | Admitting: Obstetrics and Gynecology

## 2020-08-23 VITALS — BP 117/80 | HR 85 | Ht 61.0 in | Wt 174.4 lb

## 2020-08-23 DIAGNOSIS — N938 Other specified abnormal uterine and vaginal bleeding: Secondary | ICD-10-CM

## 2020-08-23 DIAGNOSIS — R5383 Other fatigue: Secondary | ICD-10-CM

## 2020-08-23 DIAGNOSIS — I10 Essential (primary) hypertension: Secondary | ICD-10-CM

## 2020-08-23 MED ORDER — ETONOGESTREL-ETHINYL ESTRADIOL 0.12-0.015 MG/24HR VA RING: VAGINAL_RING | VAGINAL | 2 refills | Status: DC

## 2020-08-23 NOTE — Progress Notes (Signed)
HPI:      [name] is a 40 y.o. G3P1001 who LMP was Patient's last menstrual period was 08/10/2020.  Subjective:   She presents today with 3 issues.  #1 the patient is stressed and feels fatigue.  She has previously received B12 shots and would like to be tested for B12 deficiency. Secondly she has been using NuvaRing for birth control but suddenly had a gush of blood on new years that was completely unexpected.  She saw a small circular object that she thought may have been a pregnancy sac.  That was her only day of irregular bleeding.  Of significant note patient had steroid injections for carpal tunnel at the end of November. Third issue is that the patient was seen in the emergency department for headaches and hypertension.  Her headaches have improved and she has no evidence of hypertension today.    Hx: The following portions of the patient's history were reviewed and updated as appropriate:             She  has a past medical history of Blood transfusion without reported diagnosis, Depression, Diverticulitis, Diverticulosis, Ovarian cyst, Pelvic pain in female, Recurrent sinus infections, and Scoliosis. She does not have any pertinent problems on file. She  has a past surgical history that includes Spine surgery [id number]) and Nasal septum surgery (11/2013). Her family history includes Alcohol abuse in her father; Arthritis in her maternal grandmother; Asthma in her mother; Drug abuse in her father; Hypertension in her mother; Miscarriages / Stillbirths in her mother; Pancreatic cancer in her father. She  reports that she has been smoking. She has never used smokeless tobacco. She reports previous alcohol use of about 6.0 standard drinks of alcohol per week. She reports that she does not use drugs. She has a current medication list which includes the following prescription(s): bupropion, etonogestrel-ethinyl estradiol, hydrochlorothiazide, hydrocodone-acetaminophen, lamotrigine,  lamotrigine, ondansetron, probiotic product, and trazodone. She is allergic to abilify [aripiprazole] and avelox [moxifloxacin hcl in nacl].       Review of Systems:  Review of Systems  Constitutional: Denied constitutional symptoms, night sweats, recent illness, fatigue, fever, insomnia and weight loss.  Eyes: Denied eye symptoms, eye pain, photophobia, vision change and visual disturbance.  Ears/Nose/Throat/Neck: Denied ear, nose, throat or neck symptoms, hearing loss, nasal discharge, sinus congestion and sore throat.  Cardiovascular: Denied cardiovascular symptoms, arrhythmia, chest pain/pressure, edema, exercise intolerance, orthopnea and palpitations.  Respiratory: Denied pulmonary symptoms, asthma, pleuritic pain, productive sputum, cough, dyspnea and wheezing.  Gastrointestinal: Denied, gastro-esophageal reflux, melena, nausea and vomiting.  Genitourinary: Denied genitourinary symptoms including symptomatic vaginal discharge, pelvic relaxation issues, and urinary complaints.  Musculoskeletal: Denied musculoskeletal symptoms, stiffness, swelling, muscle weakness and myalgia.  Dermatologic: Denied dermatology symptoms, rash and scar.  Neurologic: Denied neurology symptoms, dizziness, headache, neck pain and syncope.  Psychiatric: Denied psychiatric symptoms, anxiety and depression.  Endocrine: Denied endocrine symptoms including hot flashes and night sweats.   Meds:   Current Outpatient Medications on File Prior to Visit  Medication Sig Dispense Refill  . buPROPion (WELLBUTRIN XL) 150 MG 24 hr tablet Take 1 tablet (150 mg total) by mouth every morning. 30 tablet 1  . hydrochlorothiazide (MICROZIDE) 12.5 MG capsule Take 1 capsule (12.5 mg total) by mouth daily. 30 capsule 0  . HYDROcodone-acetaminophen (NORCO/VICODIN) 5-325 MG tablet Take 1-2 tablets by mouth every 6 (six) hours as needed for moderate pain or severe pain. (Patient not taking: Reported on 08/23/2020) 12 tablet 0  .  lamoTRIgine (LAMICTAL) 100 MG tablet Take 1 tablet (100 mg total) by mouth daily. 30 tablet 1  . lamoTRIgine (LAMICTAL) 25 MG tablet Take one tab daily for 2 weeks then take 2 tabs daily for 2 weeks then take 3 tabs daily (Patient not taking: Reported on 08/23/2020) 60 tablet 1  . ondansetron (ZOFRAN ODT) 4 MG disintegrating tablet Take 1 tablet (4 mg total) by mouth every 8 (eight) hours as needed for nausea or vomiting. (Patient not taking: Reported on 08/23/2020) 12 tablet 0  . Probiotic Product (SOLUBLE FIBER/PROBIOTICS PO) Take 1 tablet by mouth 1 day or 1 dose. (Patient not taking: Reported on 08/23/2020)    . traZODone (DESYREL) 50 MG tablet Take 1 tablet (50 mg total) by mouth at bedtime as needed for sleep. (Patient not taking: Reported on 08/23/2020) 30 tablet 1   No current facility-administered medications on file prior to visit.       The pregnancy intention screening data noted above was reviewed. Potential methods of contraception were discussed. The patient elected to proceed with Vaginal Ring.     Objective:     Vitals:   08/23/20 1247  BP: 117/80  Pulse: 85   Filed Weights   08/23/20 1247  Weight: 174 lb 6.4 oz (79.1 kg)                Assessment:    G3P1001 Patient Active Problem List   Diagnosis Date Noted  . Bipolar 2 disorder ([facility]) 11/02/2019  . Diverticulitis of colon 06/16/2016  . 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 Vitamin D deficiency 06/19/2014  . Depression with anxiety      1. Fatigue, unspecified type   2. Dysfunctional uterine bleeding   3. Hypertension, unspecified type     Impossible to know at this time whether she had dysfunctional bleeding from her steroid injections, some other cause, or a possible miscarriage.  Doubt miscarriage because of use of NuvaRing and the single day of bleeding.  No evidence of hypertension at this time.   Plan:            1.  Continue NuvaRing  2.  Blood draw for B12  3.  Patient to follow-up for annual  examination in the next 1 to 2 months. Orders Orders Placed This Encounter  Procedures  . B12     Meds ordered this encounter  Medications  . etonogestrel-ethinyl estradiol (NUVARING) 0.12-0.015 MG/24HR vaginal ring    Sig: Insert vaginally and leave in place for 3 consecutive weeks, then remove for 1 week.    Dispense:  1 each    Refill:  2      F/U  Return for Annual Physical. I spent 22 minutes involved in the care of this patient preparing to see the patient by obtaining and reviewing her medical history (including labs, imaging tests and prior procedures), documenting clinical information in the electronic health record (EHR), counseling and coordinating care plans, writing and sending prescriptions, ordering tests or procedures and directly communicating with the patient by discussing pertinent items from her history and physical exam as well as detailing my assessment and plan as noted above so that she has an informed understanding.  All of her questions were answered.  Elonda Husky, M.D. 08/23/2020 1:18 PM

## 2020-08-24 ENCOUNTER — Other Ambulatory Visit: Payer: Self-pay

## 2020-10-08 ENCOUNTER — Telehealth: Payer: Self-pay

## 2020-10-08 DIAGNOSIS — N938 Other specified abnormal uterine and vaginal bleeding: Secondary | ICD-10-CM

## 2020-10-08 MED ORDER — ETONOGESTREL-ETHINYL ESTRADIOL 0.12-0.015 MG/24HR VA RING: VAGINAL_RING | VAGINAL | 0 refills | Status: DC

## 2020-10-08 NOTE — Telephone Encounter (Signed)
LVM  Erx 1 nuvaring. Pt will need to schedule an AE for further refills. Last AE 7/20.

## 2020-10-08 NOTE — Telephone Encounter (Signed)
Pt called in and stated that she needs a refill on her nuvaring sent to [facility] on [address]. The pt has called the pharmacy and stated that they don't see the refill in there system. Please adsive

## 2020-10-10 ENCOUNTER — Emergency Department: Payer: BC Managed Care – PPO

## 2020-10-10 ENCOUNTER — Other Ambulatory Visit: Payer: Self-pay

## 2020-10-10 ENCOUNTER — Inpatient Hospital Stay
Admission: EM | Admit: 2020-10-10 | Discharge: 2020-10-14 | DRG: 776 | Disposition: A | Discharge status: Home / Self Care | Payer: BC Managed Care – PPO | Attending: Obstetrics and Gynecology | Admitting: Obstetrics and Gynecology

## 2020-10-10 DIAGNOSIS — O8604 Sepsis following an obstetrical procedure: Secondary | ICD-10-CM | POA: Diagnosis not present

## 2020-10-10 DIAGNOSIS — Z20822 Contact with and (suspected) exposure to covid-19: Secondary | ICD-10-CM | POA: Diagnosis present

## 2020-10-10 DIAGNOSIS — Z811 Family history of alcohol abuse and dependence: Secondary | ICD-10-CM

## 2020-10-10 DIAGNOSIS — R1032 Left lower quadrant pain: Secondary | ICD-10-CM | POA: Diagnosis not present

## 2020-10-10 DIAGNOSIS — O0484 Damage to pelvic organs following (induced) termination of pregnancy: Secondary | ICD-10-CM | POA: Diagnosis present

## 2020-10-10 DIAGNOSIS — Z79899 Other long term (current) drug therapy: Secondary | ICD-10-CM

## 2020-10-10 DIAGNOSIS — R102 Pelvic and perineal pain: Secondary | ICD-10-CM

## 2020-10-10 DIAGNOSIS — F17210 Nicotine dependence, cigarettes, uncomplicated: Secondary | ICD-10-CM | POA: Diagnosis present

## 2020-10-10 DIAGNOSIS — Z813 Family history of other psychoactive substance abuse and dependence: Secondary | ICD-10-CM

## 2020-10-10 DIAGNOSIS — Z800 Family history of malignant neoplasm of digestive organs: Secondary | ICD-10-CM

## 2020-10-10 DIAGNOSIS — Z888 Allergy status to other drugs, medicaments and biological substances status: Secondary | ICD-10-CM

## 2020-10-10 DIAGNOSIS — A419 Sepsis, unspecified organism: Secondary | ICD-10-CM | POA: Diagnosis present

## 2020-10-10 DIAGNOSIS — Z8261 Family history of arthritis: Secondary | ICD-10-CM

## 2020-10-10 DIAGNOSIS — O0480 (Induced) termination of pregnancy with unspecified complications: Secondary | ICD-10-CM

## 2020-10-10 DIAGNOSIS — Z8249 Family history of ischemic heart disease and other diseases of the circulatory system: Secondary | ICD-10-CM

## 2020-10-10 DIAGNOSIS — Z825 Family history of asthma and other chronic lower respiratory diseases: Secondary | ICD-10-CM

## 2020-10-10 LAB — COMPREHENSIVE METABOLIC PANEL
ALT: 15 U/L (ref 0–44)
AST: 24 U/L (ref 15–41)
Albumin: 3.7 g/dL (ref 3.5–5.0)
Alkaline Phosphatase: 58 U/L (ref 38–126)
Anion gap: 11 (ref 5–15)
BUN: 7 mg/dL (ref 6–20)
CO2: 22 mmol/L (ref 22–32)
Calcium: 9.1 mg/dL (ref 8.9–10.3)
Chloride: 100 mmol/L (ref 98–111)
Creatinine, Ser: 0.59 mg/dL (ref 0.44–1.00)
GFR, Estimated: >60 mL/min (ref >60)
Glucose, Bld: 99 mg/dL (ref 70–99)
Potassium: 3 mmol/L — ABNORMAL LOW (ref 3.5–5.1)
Sodium: 133 mmol/L — ABNORMAL LOW (ref 135–145)
Total Bilirubin: 0.4 mg/dL (ref 0.3–1.2)
Total Protein: 7.4 g/dL (ref 6.5–8.1)

## 2020-10-10 LAB — URINALYSIS, COMPLETE (UACMP) WITH MICROSCOPIC
Bilirubin Urine: NEGATIVE
Glucose, UA: NEGATIVE mg/dL
Ketones, ur: NEGATIVE mg/dL
Nitrite: NEGATIVE
Protein, ur: NEGATIVE mg/dL
RBC / HPF: >50 RBC/hpf — ABNORMAL HIGH (ref 0–5)
Specific Gravity, Urine: 1.013 (ref 1.005–1.030)
WBC, UA: >50 WBC/hpf — ABNORMAL HIGH (ref 0–5)
pH: 6 (ref 5.0–8.0)

## 2020-10-10 LAB — CBC
HCT: 35.8 % — ABNORMAL LOW (ref 36.0–46.0)
Hemoglobin: 12.5 g/dL (ref 12.0–15.0)
MCH: 33.2 pg (ref 26.0–34.0)
MCHC: 34.9 g/dL (ref 30.0–36.0)
MCV: 95.2 fL (ref 80.0–100.0)
Platelets: 276 10*3/uL (ref 150–400)
RBC: 3.76 MIL/uL — ABNORMAL LOW (ref 3.87–5.11)
RDW: 12.7 % (ref 11.5–15.5)
WBC: 13.6 10*3/uL — ABNORMAL HIGH (ref 4.0–10.5)
nRBC: 0 % (ref 0.0–0.2)

## 2020-10-10 LAB — HCG, QUANTITATIVE, PREGNANCY: hCG, Beta Chain, Quant, S: 112 m[IU]/mL — ABNORMAL HIGH (ref <5)

## 2020-10-10 LAB — RESP PANEL BY RT-PCR (FLU A&B, COVID) ARPGX2
Influenza A by PCR: NEGATIVE
Influenza B by PCR: NEGATIVE
SARS Coronavirus 2 by RT PCR: NEGATIVE

## 2020-10-10 LAB — LIPASE, BLOOD: Lipase: 27 U/L (ref 11–51)

## 2020-10-10 LAB — LACTIC ACID, PLASMA: Lactic Acid, Venous: 1.9 mmol/L (ref 0.5–1.9)

## 2020-10-10 MED ORDER — SODIUM CHLORIDE 0.9 % IV SOLN
2.0000 g | INTRAVENOUS | Status: AC
  Administered 2020-10-11 – 2020-10-13 (×3): 2 g via INTRAVENOUS
  Filled 2020-10-10 (×3): qty 2

## 2020-10-10 MED ORDER — LACTATED RINGERS IV BOLUS
1000.0000 mL | Freq: Once | INTRAVENOUS | Status: AC
  Administered 2020-10-10: 1000 mL via INTRAVENOUS

## 2020-10-10 MED ORDER — METRONIDAZOLE IN NACL 5-0.79 MG/ML-% IV SOLN
500.0000 mg | Freq: Two times a day (BID) | INTRAVENOUS | Status: DC
  Filled 2020-10-10: qty 100

## 2020-10-10 MED ORDER — OXYCODONE HCL 5 MG PO TABS
10.0000 mg | ORAL_TABLET | ORAL | Status: DC | PRN
  Administered 2020-10-10 – 2020-10-12 (×9): 10 mg via ORAL
  Filled 2020-10-10 (×9): qty 2

## 2020-10-10 MED ORDER — DEXTROSE IN LACTATED RINGERS 5 % IV SOLN: INTRAVENOUS | Status: DC

## 2020-10-10 MED ORDER — MORPHINE SULFATE (PF) 4 MG/ML IV SOLN
4.0000 mg | Freq: Once | INTRAVENOUS | Status: AC
  Administered 2020-10-10: 4 mg via INTRAVENOUS
  Filled 2020-10-10: qty 1

## 2020-10-10 MED ORDER — POTASSIUM CHLORIDE 10 MEQ/100ML IV SOLN
10.0000 meq | INTRAVENOUS | Status: AC
  Administered 2020-10-10: 10 meq via INTRAVENOUS
  Filled 2020-10-10 (×2): qty 100

## 2020-10-10 MED ORDER — ONDANSETRON HCL 4 MG/2ML IJ SOLN
4.0000 mg | Freq: Four times a day (QID) | INTRAMUSCULAR | Status: DC | PRN
  Administered 2020-10-11 – 2020-10-13 (×3): 4 mg via INTRAVENOUS
  Filled 2020-10-10 (×4): qty 2

## 2020-10-10 MED ORDER — IOHEXOL 300 MG/ML  SOLN
100.0000 mL | Freq: Once | INTRAMUSCULAR | Status: AC | PRN
  Administered 2020-10-10: 100 mL via INTRAVENOUS

## 2020-10-10 MED ORDER — SODIUM CHLORIDE 0.9 % IV SOLN
2.0000 g | Freq: Once | INTRAVENOUS | Status: AC
  Administered 2020-10-10: 2 g via INTRAVENOUS
  Filled 2020-10-10: qty 20

## 2020-10-10 MED ORDER — METRONIDAZOLE IN NACL 5-0.79 MG/ML-% IV SOLN
500.0000 mg | Freq: Once | INTRAVENOUS | Status: AC
  Administered 2020-10-10: 500 mg via INTRAVENOUS
  Filled 2020-10-10: qty 100

## 2020-10-10 MED ORDER — MORPHINE SULFATE (PF) 2 MG/ML IV SOLN
1.0000 mg | INTRAVENOUS | Status: DC | PRN
  Administered 2020-10-10: 2 mg via INTRAVENOUS
  Filled 2020-10-10: qty 1

## 2020-10-10 MED ORDER — ACETAMINOPHEN 500 MG PO TABS
ORAL_TABLET | ORAL | Status: AC
  Filled 2020-10-10: qty 2

## 2020-10-10 MED ORDER — ACETAMINOPHEN 500 MG PO TABS
1000.0000 mg | ORAL_TABLET | Freq: Once | ORAL | Status: AC
  Administered 2020-10-10: 1000 mg via ORAL
  Filled 2020-10-10: qty 2

## 2020-10-10 MED ORDER — ONDANSETRON HCL 4 MG/2ML IJ SOLN
4.0000 mg | INTRAMUSCULAR | Status: AC
  Administered 2020-10-10: 4 mg via INTRAVENOUS
  Filled 2020-10-10: qty 2

## 2020-10-10 MED ORDER — ACETAMINOPHEN 500 MG PO TABS
1000.0000 mg | ORAL_TABLET | Freq: Four times a day (QID) | ORAL | Status: DC | PRN
  Administered 2020-10-10 – 2020-10-13 (×6): 1000 mg via ORAL
  Filled 2020-10-10 (×7): qty 2

## 2020-10-10 MED ORDER — ONDANSETRON HCL 4 MG PO TABS
4.0000 mg | ORAL_TABLET | Freq: Four times a day (QID) | ORAL | Status: DC | PRN
  Administered 2020-10-13: 4 mg via ORAL
  Filled 2020-10-10: qty 1

## 2020-10-10 NOTE — ED Notes (Signed)
poct pregnancy Negative 

## 2020-10-10 NOTE — ED Triage Notes (Signed)
Pt to ER via POV from home.   Pt reports history of diverticulitis, pt states she has lower generalized abdominal pain that started yesterday morning, episodes where she felt clammy with possible fevers at home, headaches today at work. Reports last BM was 10/09/20.   Pt states she took tylenol 1130, 800mg  ibuprofen PTA.

## 2020-10-10 NOTE — ED Notes (Signed)
GYN at bedside with ultrasound in progress

## 2020-10-10 NOTE — ED Notes (Signed)
Pt has left lower abd pain, fever, h/a, nausea.  Sx began yesterday, worse today.  Hx diverticulitis.  No v/d  Menses now.  Pt alert  Speech clear.

## 2020-10-10 NOTE — Consult Note (Signed)
CODE SEPSIS - PHARMACY COMMUNICATION  **Broad Spectrum Antibiotics should be administered within 1 hour of Sepsis diagnosis**  Time Code Sepsis Called/Page Received: 1657  Antibiotics Ordered: ceftriaxone, metronidazole   Time of 1st antibiotic administration: 1761  Additional action taken by pharmacy: n/a  If necessary, Name of Provider/Nurse Contacted: [place] ,PharmD, BCPS Clinical Pharmacist  10/10/2020  5:02 PM

## 2020-10-10 NOTE — Sepsis Progress Note (Signed)
Krystal Blackwell is tracking the Code Sepsis.

## 2020-10-10 NOTE — ED Notes (Signed)
Pt to ct scan.

## 2020-10-10 NOTE — ED Notes (Signed)
Pt awake and alert; has ambulated to in room commode since shift change-- Pt reports ongoing lower abd pain rated 8/10 described as pressure- denies n/v -- pt fever has improved since PO Tylenol and fluids administered; currently 99.1f oral.  Pt on continuous pulse ox and cardiac monitoring; sinus tach HR 114 - all other VSS.  Pt updated on plan for pending u/s and is agreeable -- denies any additional needs, questions, concerns at this time.  Will monitor for acute changes and maintain plan of care

## 2020-10-10 NOTE — ED Provider Notes (Signed)
[facility] Department Provider Note   ____________________________________________   Event Date/Time   First MD Initiated Contact with Patient 10/10/20 1652     (approximate)  I have reviewed the triage vital signs and the nursing notes.   HISTORY  Chief Complaint Abdominal Pain    HPI Krystal Blackwell is a 40 y.o. female here for evaluation of abdominal pain  Patient reports last night started having increasing pain in her left lower abdomen.  Felt a little funny the day before, thought she might have gas cramps but now believes she may have diverticulitis which she has a history of in the past.  Experiencing fevers nausea but no vomiting.  No diarrhea.  No chest pain or trouble breathing.   Pain is severe, cramp-like worsening located in the lower primarily left to mid lower abdomen.  Denies any recent vaginal discharge than typical.  Would like bleeding.  She has been on her menstrual cycle for a couple days  Patient denies pregnancy.  Patient reports no previous major medical history aside from diverticulitis  Past Medical History:  Diagnosis Date  . Blood transfusion without reported diagnosis    0630,1601  . Depression   . Diverticulitis   . Diverticulosis   . Ovarian cyst   . Pelvic pain in female   . Recurrent sinus infections   . Scoliosis     Patient Active Problem List   Diagnosis Date Noted  . Bipolar 2 disorder ([place]) 11/02/2019  . Diverticulitis of colon 06/16/2016  . 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 Vitamin D deficiency 06/19/2014  . Depression with anxiety     Past Surgical History:  Procedure Laterality Date  . NASAL SEPTUM SURGERY  11/2013  . SPINE SURGERY  97,98,99,00   scoliosis    Prior to Admission medications   Medication Sig Start Date End Date Taking? Authorizing Provider  buPROPion (WELLBUTRIN XL) 150 MG 24 hr tablet Take 1 tablet (150 mg total) by mouth every morning. 02/14/20   Nevada Crane, MD  etonogestrel-ethinyl estradiol (NUVARING) 0.12-0.015 MG/24HR vaginal ring Insert vaginally and leave in place for 3 consecutive weeks, then remove for 1 week. 10/08/20   Harlin Heys, MD  hydrochlorothiazide (MICROZIDE) 12.5 MG capsule Take 1 capsule (12.5 mg total) by mouth daily. 06/11/20 07/11/20  Duffy Bruce, MD  HYDROcodone-acetaminophen (NORCO/VICODIN) 5-325 MG tablet Take 1-2 tablets by mouth every 6 (six) hours as needed for moderate pain or severe pain. Patient not taking: Reported on 08/23/2020 12/27/19 12/26/20  Duffy Bruce, MD  lamoTRIgine (LAMICTAL) 100 MG tablet Take 1 tablet (100 mg total) by mouth daily. 02/14/20   Nevada Crane, MD  lamoTRIgine (LAMICTAL) 25 MG tablet Take one tab daily for 2 weeks then take 2 tabs daily for 2 weeks then take 3 tabs daily Patient not taking: Reported on 08/23/2020 03/12/20   Nevada Crane, MD  ondansetron (ZOFRAN ODT) 4 MG disintegrating tablet Take 1 tablet (4 mg total) by mouth every 8 (eight) hours as needed for nausea or vomiting. Patient not taking: Reported on 08/23/2020 12/27/19   Duffy Bruce, MD  Probiotic Product (SOLUBLE FIBER/PROBIOTICS PO) Take 1 tablet by mouth 1 day or 1 dose. Patient not taking: Reported on 08/23/2020    [provider]  traZODone (DESYREL) 50 MG tablet Take 1 tablet (50 mg total) by mouth at bedtime as needed for sleep. Patient not taking: Reported on 08/23/2020 02/14/20   Nevada Crane, MD    Allergies Abilify [aripiprazole] and  Avelox [moxifloxacin hcl in nacl]  Family History  Problem Relation Age of Onset  . Asthma Mother   . Hypertension Mother   . Miscarriages / [place] Mother   . Alcohol abuse Father   . Drug abuse Father   . Pancreatic cancer Father   . Arthritis Maternal Grandmother   . Colon cancer Neg Hx   . Stomach cancer Neg Hx   . Esophageal cancer Neg Hx   . Rectal cancer Neg Hx   . Liver cancer Neg Hx     Social History Social History   Tobacco Use  .  Smoking status: Current Some Day Smoker  . Smokeless tobacco: Never Used  . Tobacco comment: 2-3 CIGARETTES EVERY TWO WEEKS  Vaping Use  . Vaping Use: Never used  Substance Use Topics  . Alcohol use: Not Currently    Alcohol/week: 6.0 standard drinks    Types: 6 Cans of beer per week    Comment: SOCIALLY  . Drug use: No    Review of Systems Constitutional: Fevers chills and fatigue Eyes: No visual changes. ENT: No sore throat. Cardiovascular: Denies chest pain. Respiratory: Denies shortness of breath. Gastrointestinal: See HPI Genitourinary: Negative for dysuria. Musculoskeletal: Negative for back pain. Skin: Negative for rash. Neurological: Negative for headaches, areas of focal weakness or numbness.    ____________________________________________   PHYSICAL EXAM:  VITAL SIGNS: ED Triage Vitals  Enc Vitals Group     BP 10/10/20 1621 (!) 143/95     Pulse Rate 10/10/20 1621 (!) 127     Resp 10/10/20 1621 18     Temp 10/10/20 1621 (!) 102.1 F (38.9 C)     Temp Source 10/10/20 1621 Oral     SpO2 10/10/20 1621 99 %     Weight 10/10/20 1622 180 lb (81.6 kg)     Height 10/10/20 1622 5\' 1"  (1.549 m)     Head Circumference --      Peak Flow --      Pain Score 10/10/20 1621 10     Pain Loc --      Pain Edu? --      Excl. in [place]? --     Constitutional: Alert and oriented.  Mildly ill-appearing, appears in significant pain, wincing in pain Eyes: Conjunctivae are normal. Head: Atraumatic. Nose: No congestion/rhinnorhea. Mouth/Throat: Mucous membranes are slightly dry. Neck: No stridor.  Cardiovascular: Tachycardic rate, regular rhythm. Grossly normal heart sounds.  Good peripheral circulation.  Sinus tachycardia Respiratory: Normal respiratory effort.  No retractions. Lungs CTAB. Gastrointestinal: Soft and patient exquisitely tender Musculoskeletal: No lower extremity tenderness nor edema. Neurologic:  Normal speech and language. No gross focal neurologic deficits  are appreciated.  Skin:  Skin is warm, dry and intact. No rash noted. Psychiatric: Mood and affect are normal. Speech and behavior are normal.  ____________________________________________   LABS (all labs ordered are listed, but only abnormal results are displayed)  Labs Reviewed  COMPREHENSIVE METABOLIC PANEL - Abnormal; Notable for the following components:      Result Value   Sodium 133 (*)    Potassium 3.0 (*)    All other components within normal limits  CBC - Abnormal; Notable for the following components:   WBC 13.6 (*)    RBC 3.76 (*)    HCT 35.8 (*)    All other components within normal limits  URINALYSIS, COMPLETE (UACMP) WITH MICROSCOPIC - Abnormal; Notable for the following components:   Color, Urine YELLOW (*)    APPearance HAZY (*)  Hgb urine dipstick LARGE (*)    Leukocytes,Ua LARGE (*)    RBC / HPF >50 (*)    WBC, UA >50 (*)    Bacteria, UA RARE (*)    All other components within normal limits  HCG, QUANTITATIVE, PREGNANCY - Abnormal; Notable for the following components:   hCG, Beta Chain, Quant, S 112 (*)    All other components within normal limits  RESP PANEL BY RT-PCR (FLU A&B, COVID) ARPGX2  CULTURE, BLOOD (ROUTINE X 2)  CULTURE, BLOOD (ROUTINE X 2)  LIPASE, BLOOD  LACTIC ACID, PLASMA  POC URINE PREG, ED   ____________________________________________  EKG   ____________________________________________  RADIOLOGY  CT ABDOMEN PELVIS W CONTRAST  Result Date: 10/10/2020 CLINICAL DATA:  Lower generalized abdominal pain EXAM: CT ABDOMEN AND PELVIS WITH CONTRAST TECHNIQUE: Multidetector CT imaging of the abdomen and pelvis was performed using the standard protocol following bolus administration of intravenous contrast. CONTRAST:  135mL OMNIPAQUE IOHEXOL 300 MG/ML  SOLN COMPARISON:  CT 12/27/2019, 06/01/2017 FINDINGS: Lower chest: Lung bases demonstrate no acute consolidation or pleural effusion. Normal cardiac size. Hepatobiliary: No focal liver  abnormality is seen. No gallstones, gallbladder wall thickening, or biliary dilatation. Pancreas: Unremarkable. No pancreatic ductal dilatation or surrounding inflammatory changes. Spleen: Borderline enlarged at 14 cm. Adrenals/Urinary Tract: Right adrenal gland is normal. Increased left adrenal gland nodule measuring 11 mm. Kidneys show no hydronephrosis. The urinary bladder is unremarkable. Stomach/Bowel: The stomach is nonenlarged. Fluid filled nondistended mid small bowel without bowel wall thickening. Negative appendix. Vascular/Lymphatic: Nonaneurysmal aorta.  No suspicious nodes. Reproductive: The uterus is prominent in size and diffusely hyperemic. There is heterogenous endometrial fluid or thickening measuring up to 19 mm in size. Small amount of right adnexal fluid and mild soft tissue stranding at the right adnexa. Questionable linear defect within the right lower uterine segment/upper cervix, coronal series 5, image number 59. No adnexal mass lesion. Other: Negative for free air.  Trace free fluid in the pelvis. Musculoskeletal: No acute or significant osseous findings. Scoliosis IMPRESSION: 1. Prominent size of uterus with uterine hyperemia. Linear hypodensity/questionable defect within the right lower uterine segment, indeterminate for small area of perforation, correlate for any history of recent intervention. Small amount of right adnexal fluid and mild right adnexal inflammatory process. Heterogenous fluid or irregular endometrial thickening up to 19 mm, recommend correlation with pelvic ultrasound. 2. Borderline to mild splenomegaly 3. 11 mm left adrenal gland nodule increased compared to prior. Suggest further evaluation with nonemergent adrenal CT. Electronically Signed   By: Donavan Foil M.D.   On: 10/10/2020 18:13    CT imaging is reviewed, limited imaging as above read by radiologist I am significantly concerned though about consistent findings in and around the uterus and adnexa.  There  is concern for perforation ____________________________________________   PROCEDURES  Procedure(s) performed: None  Procedures  Critical Care performed: Yes, see critical care note(s)  CRITICAL CARE Performed by: Delman Kitten   Total critical care time: 40 minutes  Critical care time was exclusive of separately billable procedures and treating other patients.  Critical care was necessary to treat or prevent imminent or life-threatening deterioration.  Critical care was time spent personally by me on the following activities: development of treatment plan with patient and/or surrogate as well as nursing, discussions with consultants, evaluation of patient's response to treatment, examination of patient, obtaining history from patient or surrogate, ordering and performing treatments and interventions, ordering and review of laboratory studies, ordering and review of radiographic studies,  pulse oximetry and re-evaluation of patient's condition.  Sepsis and concern for organ perforation.  Patient requiring emergent consultation with OB/GYN, broad-spectrum antibiotic coverage for concerns of acute intra-abdominal infection including high risk for sepsis post abortion with possible endometritis ____________________________________________   INITIAL IMPRESSION / ASSESSMENT AND PLAN / ED COURSE  Pertinent labs & imaging results that were available during my care of the patient were reviewed by me and considered in my medical decision making (see chart for details).   Differential diagnosis includes but is not limited to, abdominal perforation, aortic dissection, cholecystitis, appendicitis, diverticulitis, colitis, esophagitis/gastritis, kidney stone, pyelonephritis, urinary tract infection, aortic aneurysm. All are considered in decision and treatment plan. Based upon the patient's presentation and risk factors, given the patient's history and location of pain primary in the left lower abdomen  and suprapubic region I am concerned about recurrent diverticulitis primarily but other etiologies such as acute obstetric, urinary, etc. etiologies are considered.  Proceed with CT imaging and initiate broad-spectrum antibiotics due to a very high concern for sepsis.   Clinical Course as of 10/10/20 2018  Wed Oct 10, 2020  1858 Paged Dr. Amalia Hailey, OB/GYN for consultation request for concerns of sepsis possibly associated with a uterine perforation.  Patient did now tell me that she did not want to speak to it earlier, but she had a elective abortion at the end of last week and an abortion clinic in [place].  With this finding in keeping with her CT scan I am very concerned about uterine perforation and sepsis. [MQ]  1917 Gust with Dr. Jeannie Fend - consult request and concerns for sepsis with possible uterine perforation (on CT), he request transvaginal ultrasound and also a hCG add-on.  He will be coming to see the patient in consult shortly. [MQ]  1953 Patient reports pain improved resting at this time.  Awaiting ultrasound and Dr. Amalia Hailey arrival.  She is alert well oriented without distress.  Overall appears to be improving [MQ]    Clinical Course User Index [MQ] Delman Kitten, MD  ----------------------------------------- 7:16 PM on 10/10/2020 -----------------------------------------   Sepsis reassessment patient status approximately the same, remains slightly tachycardic, normotensive, pain starting to come back up will give additional morphine.  Capillary refill normal.  Clear lungs.  Discussed with Dr. Amalia Hailey in the ER, he is admitting the patient to his service for further treatment and care. ____________________________________________   FINAL CLINICAL IMPRESSION(S) / ED DIAGNOSES  Final diagnoses:  Complication following medical abortion  Sepsis, due to unspecified organism, unspecified whether acute organ dysfunction present [facility])        Note:  This document was prepared  using Dragon voice recognition software and may include unintentional dictation errors       Delman Kitten, MD 10/10/20 2019

## 2020-10-10 NOTE — Progress Notes (Signed)
Notified bedside nurse of need to draw blood cultures.  

## 2020-10-10 NOTE — H&P (Signed)
ADMIT NOTE  HPI:      Krystal Blackwell is a 40 y.o. G3P1001 who LMP was Patient's last menstrual period was 10/09/2020..  Subjective: She presents today complaining of worsening abdominal pain which began yesterday and has steadily become worse over the last day. Significant history includes patient had an elective abortion on Friday and states that she noted no complications or issues Friday Saturday or Sunday. She does report that she did not feel quite right on Monday and that yesterday she had a low-grade fever and felt clammy. She continued to work as normal. Today her pain became worse and she felt febrile. She presented to emergency department. She is having a small amount of vaginal bleeding like a light menstrual period. She reports a normal bowel movement yesterday and she has had no issues with urination. She has been able to take food and liquids without problem.          HISTORY Allergies  Allergen Reactions  . Avelox [Moxifloxacin Hcl In Nacl] Swelling  . Abilify [Aripiprazole] Swelling    Made jaw twitch    OB History  OB History  Gravida Para Term Preterm AB Living  3 3 1     1   SAB IAB Ectopic Multiple Live Births        0 1    # Outcome Date GA Lbr Len/2nd Weight Sex Delivery Anes PTL Lv  3 Term 05/29/18 [redacted]w[redacted]d / 00:28 3590 g F Vag-Spont EPI  LIV  2 Para           1 Para             Past Medical History  Past Medical History:  Diagnosis Date  . Blood transfusion without reported diagnosis    [id number]  . Depression   . Diverticulitis   . Diverticulosis   . Ovarian cyst   . Pelvic pain in female   . Recurrent sinus infections   . Scoliosis     Past Surgical History  Past Surgical History:  Procedure Laterality Date  . NASAL SEPTUM SURGERY  11/2013  . SPINE SURGERY  97,98,99,00   scoliosis      Past Social History:  Social History   Socioeconomic History  . Marital status: Legally Separated    Spouse name: Not on file  . Number  of children: 2  . Years of education: Not on file  . Highest education level: Not on file  Occupational History  . Occupation: EC Teacher  Tobacco Use  . Smoking status: Current Some Day Smoker  . Smokeless tobacco: Never Used  . Tobacco comment: 2-3 CIGARETTES EVERY TWO WEEKS  Vaping Use  . Vaping Use: Never used  Substance and Sexual Activity  . Alcohol use: Not Currently    Alcohol/week: 6.0 standard drinks    Types: 6 Cans of beer per week    Comment: SOCIALLY  . Drug use: No  . Sexual activity: Yes    Birth control/protection: None, Other-see comments, Inserts  Other Topics Concern  . Not on file  Social History Narrative   Entered 06/2014:   Has 2 children--both are boys--one is 64 y/o. The other is "teenager"--not sure exact age.   At home --it is her and her 2 sons.   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   Social Determinants of Health   Financial Resource Strain: Not on file  Food Insecurity: Not on  file  Transportation Needs: Not on file  Physical Activity: Not on file  Stress: Not on file  Social Connections: Not on file    Family History  Family History  Problem Relation Age of Onset  . Asthma Mother   . Hypertension Mother   . Miscarriages / [place] Mother   . Alcohol abuse Father   . Drug abuse Father   . Pancreatic cancer Father   . Arthritis Maternal Grandmother   . Colon cancer Neg Hx   . Stomach cancer Neg Hx   . Esophageal cancer Neg Hx   . Rectal cancer Neg Hx   . Liver cancer Neg Hx      ROS: Constitutional: See HPI for additional information.  Eyes: Denied eye symptoms, eye pain, photophobia, vision change and visual disturbance.  Ears/Nose/Throat/Neck: Denied ear, nose, throat or neck symptoms, hearing loss, nasal discharge, sinus congestion and sore throat.  Cardiovascular: Denied cardiovascular symptoms, arrhythmia, chest pain/pressure, edema, exercise intolerance, orthopnea and palpitations.   Respiratory: Denied pulmonary symptoms, asthma, pleuritic pain, productive sputum, cough, dyspnea and wheezing.  Gastrointestinal: Denied, gastro-esophageal reflux, melena, nausea and vomiting.  Genitourinary: See HPI for additional information.  Musculoskeletal: Denied musculoskeletal symptoms, stiffness, swelling, muscle weakness and myalgia.  Dermatologic: Denied dermatology symptoms, rash and scar.  Neurologic: Denied neurology symptoms, dizziness, headache, neck pain and syncope.  Psychiatric: Denied psychiatric symptoms, anxiety and depression.  Endocrine: Denied endocrine symptoms including hot flashes and night sweats.   Medications    Patient's Medications  New Prescriptions   No medications on file  Previous Medications   BUPROPION (WELLBUTRIN XL) 150 MG 24 HR TABLET    Take 1 tablet (150 mg total) by mouth every morning.   ETONOGESTREL-ETHINYL ESTRADIOL (NUVARING) 0.12-0.015 MG/24HR VAGINAL RING    Insert vaginally and leave in place for 3 consecutive weeks, then remove for 1 week.   HYDROCHLOROTHIAZIDE (MICROZIDE) 12.5 MG CAPSULE    Take 1 capsule (12.5 mg total) by mouth daily.  Modified Medications   No medications on file  Discontinued Medications   HYDROCODONE-ACETAMINOPHEN (NORCO/VICODIN) 5-325 MG TABLET    Take 1-2 tablets by mouth every 6 (six) hours as needed for moderate pain or severe pain.   LAMOTRIGINE (LAMICTAL) 100 MG TABLET    Take 1 tablet (100 mg total) by mouth daily.   LAMOTRIGINE (LAMICTAL) 25 MG TABLET    Take one tab daily for 2 weeks then take 2 tabs daily for 2 weeks then take 3 tabs daily   ONDANSETRON (ZOFRAN ODT) 4 MG DISINTEGRATING TABLET    Take 1 tablet (4 mg total) by mouth every 8 (eight) hours as needed for nausea or vomiting.   PROBIOTIC PRODUCT (SOLUBLE FIBER/PROBIOTICS PO)    Take 1 tablet by mouth 1 day or 1 dose.   TRAZODONE (DESYREL) 50 MG TABLET    Take 1 tablet (50 mg total) by mouth at bedtime as needed for sleep.   @ENCMED @    Objective: Vitals:   10/10/20 1826 10/10/20 1934  BP: (!) 117/105 124/73  Pulse: (!) 110 (!) 114  Resp: 20 18  Temp: (!) 101.1 F (38.4 C) 99.2 F (37.3 C)  SpO2: 99% 97%    @ENCLABS @  HEENT: Grossly within normal limits.  Normo-cephalic.  Neck Supple.  Pupils reactive.  Thyroid Smooth without nodularity or enlargement.  Skin No rashes, lesions or ulceration  Breasts:  Deferred  Lungs: Clear to auscultation.  No rales or wheezes.  Heart: NSR.  No murmurs or rubs appreciated.  Abdomen: Soft. Moderately tender with some guarding. Pain is worse in the left lower quadrant.  Extremities: Moves all appropriately.  Normal ROM for age.  Neuro: Oriented to PPT.  Normal mood.   CT Reproductive: The uterus is prominent in size and diffusely hyperemic. There is heterogenous endometrial fluid or thickening measuring up to 19 mm in size. Small amount of right adnexal fluid and mild soft tissue stranding at the right adnexa. Questionable linear defect within the right lower uterine segment/upper cervix, coronal series 5, image number 59. No adnexal mass lesion.  Other: Negative for free air.  Trace free fluid in the pelvis.  Musculoskeletal: No acute or significant osseous findings. Scoliosis  IMPRESSION: 1. Prominent size of uterus with uterine hyperemia. Linear hypodensity/questionable defect within the right lower uterine segment, indeterminate for small area of perforation, correlate for any history of recent intervention. Small amount of right adnexal fluid and mild right adnexal inflammatory process. Heterogenous fluid or irregular endometrial thickening up to 19 mm, recommend correlation with pelvic ultrasound.  US IMPRESSION: Normal pelvic ultrasound.  ASSESSMENT:  1. Complication following elective abortion  Likely uterine perforation with intraperitoneal infection. Low likelihood of bowel perforation. 2. Sepsis from uterine/intra-abdominal infection.  PLAN: 1.  Continue IV antibiotics 2. Pain management 3. Follow white count and fever 4. N.p.o. -advance diet very slowly over the next few days 5. Expect resolution of fever white count and pain with continued antibiotics.  Jeannie Fend ,MD 10/10/2020,8:42 PM

## 2020-10-11 DIAGNOSIS — O0480 (Induced) termination of pregnancy with unspecified complications: Secondary | ICD-10-CM | POA: Diagnosis present

## 2020-10-11 DIAGNOSIS — Z8249 Family history of ischemic heart disease and other diseases of the circulatory system: Secondary | ICD-10-CM | POA: Diagnosis not present

## 2020-10-11 DIAGNOSIS — Z825 Family history of asthma and other chronic lower respiratory diseases: Secondary | ICD-10-CM | POA: Diagnosis not present

## 2020-10-11 DIAGNOSIS — R1032 Left lower quadrant pain: Secondary | ICD-10-CM | POA: Diagnosis present

## 2020-10-11 DIAGNOSIS — Z813 Family history of other psychoactive substance abuse and dependence: Secondary | ICD-10-CM | POA: Diagnosis not present

## 2020-10-11 DIAGNOSIS — Z79899 Other long term (current) drug therapy: Secondary | ICD-10-CM | POA: Diagnosis not present

## 2020-10-11 DIAGNOSIS — O0484 Damage to pelvic organs following (induced) termination of pregnancy: Secondary | ICD-10-CM | POA: Diagnosis present

## 2020-10-11 DIAGNOSIS — O8604 Sepsis following an obstetrical procedure: Secondary | ICD-10-CM | POA: Diagnosis present

## 2020-10-11 DIAGNOSIS — Z811 Family history of alcohol abuse and dependence: Secondary | ICD-10-CM | POA: Diagnosis not present

## 2020-10-11 DIAGNOSIS — Z8261 Family history of arthritis: Secondary | ICD-10-CM | POA: Diagnosis not present

## 2020-10-11 DIAGNOSIS — Z888 Allergy status to other drugs, medicaments and biological substances status: Secondary | ICD-10-CM | POA: Diagnosis not present

## 2020-10-11 DIAGNOSIS — Z20822 Contact with and (suspected) exposure to covid-19: Secondary | ICD-10-CM | POA: Diagnosis present

## 2020-10-11 DIAGNOSIS — Z800 Family history of malignant neoplasm of digestive organs: Secondary | ICD-10-CM | POA: Diagnosis not present

## 2020-10-11 DIAGNOSIS — F17210 Nicotine dependence, cigarettes, uncomplicated: Secondary | ICD-10-CM | POA: Diagnosis present

## 2020-10-11 LAB — CBC WITH DIFFERENTIAL/PLATELET
Abs Immature Granulocytes: 0.09 10*3/uL — ABNORMAL HIGH (ref 0.00–0.07)
Basophils Absolute: 0 10*3/uL (ref 0.0–0.1)
Basophils Relative: 0 %
Eosinophils Absolute: 0 10*3/uL (ref 0.0–0.5)
Eosinophils Relative: 0 %
HCT: 32 % — ABNORMAL LOW (ref 36.0–46.0)
Hemoglobin: 11 g/dL — ABNORMAL LOW (ref 12.0–15.0)
Immature Granulocytes: 1 %
Lymphocytes Relative: 1 %
Lymphs Abs: 0.1 10*3/uL — ABNORMAL LOW (ref 0.7–4.0)
MCH: 33 pg (ref 26.0–34.0)
MCHC: 34.4 g/dL (ref 30.0–36.0)
MCV: 96.1 fL (ref 80.0–100.0)
Monocytes Absolute: 0.2 10*3/uL (ref 0.1–1.0)
Monocytes Relative: 1 %
Neutro Abs: 13 10*3/uL — ABNORMAL HIGH (ref 1.7–7.7)
Neutrophils Relative %: 97 %
Platelets: 239 10*3/uL (ref 150–400)
RBC: 3.33 MIL/uL — ABNORMAL LOW (ref 3.87–5.11)
RDW: 12.9 % (ref 11.5–15.5)
WBC: 13.4 10*3/uL — ABNORMAL HIGH (ref 4.0–10.5)
nRBC: 0 % (ref 0.0–0.2)

## 2020-10-11 LAB — BASIC METABOLIC PANEL
Anion gap: 10 (ref 5–15)
BUN: 6 mg/dL (ref 6–20)
CO2: 20 mmol/L — ABNORMAL LOW (ref 22–32)
Calcium: 7.8 mg/dL — ABNORMAL LOW (ref 8.9–10.3)
Chloride: 99 mmol/L (ref 98–111)
Creatinine, Ser: 0.59 mg/dL (ref 0.44–1.00)
GFR, Estimated: >60 mL/min (ref >60)
Glucose, Bld: 133 mg/dL — ABNORMAL HIGH (ref 70–99)
Potassium: 2.9 mmol/L — ABNORMAL LOW (ref 3.5–5.1)
Sodium: 129 mmol/L — ABNORMAL LOW (ref 135–145)

## 2020-10-11 MED ORDER — POTASSIUM CHLORIDE 10 MEQ/100ML IV SOLN
10.0000 meq | Freq: Once | INTRAVENOUS | Status: AC
  Administered 2020-10-11: 10 meq via INTRAVENOUS
  Filled 2020-10-11: qty 100

## 2020-10-11 MED ORDER — IBUPROFEN 600 MG PO TABS
600.0000 mg | ORAL_TABLET | Freq: Four times a day (QID) | ORAL | Status: DC | PRN
  Administered 2020-10-12 – 2020-10-14 (×4): 600 mg via ORAL
  Filled 2020-10-11 (×4): qty 1

## 2020-10-11 MED ORDER — SODIUM CHLORIDE 0.9 % IV SOLN
INTRAVENOUS | Status: DC | PRN
  Administered 2020-10-11 – 2020-10-13 (×2): 500 mL via INTRAVENOUS

## 2020-10-11 MED ORDER — METRONIDAZOLE IN NACL 5-0.79 MG/ML-% IV SOLN
500.0000 mg | Freq: Two times a day (BID) | INTRAVENOUS | Status: AC
  Administered 2020-10-11 – 2020-10-13 (×6): 500 mg via INTRAVENOUS
  Filled 2020-10-11 (×7): qty 100

## 2020-10-11 MED ORDER — IBUPROFEN 600 MG PO TABS
ORAL_TABLET | ORAL | Status: AC
  Filled 2020-10-11: qty 1

## 2020-10-11 MED ORDER — POTASSIUM CHLORIDE 10 MEQ/100ML IV SOLN
10.0000 meq | INTRAVENOUS | Status: AC
  Administered 2020-10-11 (×2): 10 meq via INTRAVENOUS
  Filled 2020-10-11 (×2): qty 100

## 2020-10-11 MED ORDER — IBUPROFEN 600 MG PO TABS
600.0000 mg | ORAL_TABLET | ORAL | Status: AC
  Administered 2020-10-11: 600 mg via ORAL

## 2020-10-11 NOTE — Progress Notes (Signed)
Patient ID: Krystal Blackwell, female   DOB: 02-06-1981, 40 y.o.   MRN: [id number]        HOSPITAL DAY # 1  Subjective:   The patient states she feels as if she is doing better.  She is having less abdominal pain.  She is generally able to take p.o. medications.  She is ambulating well. She is urinating without difficulty and is passing flatus.  After drinking a glass of water this morning she did have some vomiting, but now feels better.   Objective:  BP 122/74 (BP Location: Left Arm)   Pulse (!) 108   Temp 99.3 F (37.4 C) (Axillary)   Resp 20   Ht 5\' 1"  (1.549 m)   Wt 81.6 kg   LMP 10/09/2020   SpO2 100%   BMI 34.01 kg/m     Abdomen:                          Abdomen soft and nontender without distention, masses    +BS       Temperature spike this morning to 100.6.  T-max at 3 AM 103.2. White count unchanged Blood cultures negative     Assessment/Plan:   Slowly improving.   Sepsis improved   She is feeling generally better.   Continue with clear liquids at this time.  Follow for any evidence of bowel involvement.   Follow fever, WBCs.   Continue antibiotics.   Consider advancing diet slowly tomorrow if tolerated.  Discussed care plan and current diagnosis in detail with the patient.  All questions answered.      Finis Bud, M.D. 10/11/2020 1:35 PM

## 2020-10-12 LAB — CBC WITH DIFFERENTIAL/PLATELET
Abs Immature Granulocytes: 0.09 10*3/uL — ABNORMAL HIGH (ref 0.00–0.07)
Basophils Absolute: 0 10*3/uL (ref 0.0–0.1)
Basophils Relative: 0 %
Eosinophils Absolute: 0 10*3/uL (ref 0.0–0.5)
Eosinophils Relative: 0 %
HCT: 37.9 % (ref 36.0–46.0)
Hemoglobin: 13.1 g/dL (ref 12.0–15.0)
Immature Granulocytes: 1 %
Lymphocytes Relative: 4 %
Lymphs Abs: 0.3 10*3/uL — ABNORMAL LOW (ref 0.7–4.0)
MCH: 33.2 pg (ref 26.0–34.0)
MCHC: 34.6 g/dL (ref 30.0–36.0)
MCV: 96.2 fL (ref 80.0–100.0)
Monocytes Absolute: 0.3 10*3/uL (ref 0.1–1.0)
Monocytes Relative: 4 %
Neutro Abs: 6.6 10*3/uL (ref 1.7–7.7)
Neutrophils Relative %: 91 %
Platelets: 190 10*3/uL (ref 150–400)
RBC: 3.94 MIL/uL (ref 3.87–5.11)
RDW: 12.9 % (ref 11.5–15.5)
WBC: 7.3 10*3/uL (ref 4.0–10.5)
nRBC: 0 % (ref 0.0–0.2)

## 2020-10-12 LAB — RESP PANEL BY RT-PCR (FLU A&B, COVID) ARPGX2
Influenza A by PCR: NEGATIVE
Influenza B by PCR: NEGATIVE
SARS Coronavirus 2 by RT PCR: NEGATIVE

## 2020-10-12 MED ORDER — OXYCODONE HCL 5 MG PO TABS
5.0000 mg | ORAL_TABLET | ORAL | Status: DC | PRN
  Administered 2020-10-12: 10 mg via ORAL
  Administered 2020-10-13: 5 mg via ORAL
  Administered 2020-10-13 (×2): 10 mg via ORAL
  Administered 2020-10-13 – 2020-10-14 (×3): 5 mg via ORAL
  Filled 2020-10-12 (×2): qty 1
  Filled 2020-10-12: qty 2
  Filled 2020-10-12: qty 1
  Filled 2020-10-12 (×2): qty 2
  Filled 2020-10-12: qty 1

## 2020-10-12 MED ORDER — DIPHENHYDRAMINE HCL 25 MG PO CAPS
25.0000 mg | ORAL_CAPSULE | Freq: Four times a day (QID) | ORAL | Status: DC | PRN
  Administered 2020-10-12 – 2020-10-13 (×3): 25 mg via ORAL
  Filled 2020-10-12 (×3): qty 1

## 2020-10-12 NOTE — Progress Notes (Signed)
Patient ID: Krystal Blackwell, female   DOB: Dec 24, 1980, 40 y.o.   MRN: [id number]       HOSPITAL DAY # 2  Subjective:   The patient does not have complaints.  She says that she feels better each day.  Describes decreasing abdominal pain.  She is ambulating well. She is taking PO well. Her pain is well controlled with her current medications and she has been decreasing her use of narcotics. She is urinating without difficulty and is passing flatus.  She drank liquids yesterday and has not thrown up since early in the morning on 2/24.    She does state that she developed a cough a little bit last night and a little this morning.  She attributes this to fans in her room to keep her cool.   Objective:  BP 102/72 (BP Location: Right Arm)   Pulse 88   Temp 98 F (36.7 C) (Oral)   Resp 18   Ht 5\' 1"  (1.549 m)   Wt 81.6 kg   LMP 10/09/2020   SpO2 97%   BMI 34.01 kg/m     Abdomen:                          Abdomen soft and nontender without distention.   Normal bowel sounds   Lungs clear.  T-max last night 102.3.  Otherwise afebrile. WBCs-7 (significant decrease)    Assessment:   She continues to show improvement daily.  Her T-max have decreased each day.   Significant WBC decrease.   Cough?   Currently no evidence of bowel involvement.     Plan:        Advance diet slowly today to full diet.   Continue intravenous antibiotics and antipyretics as needed   Retest for Covid because of newly developed cough  Discussed condition and care plan in detail with the patient.  She agrees with the above plan.   Finis Bud, M.D. 10/12/2020 11:05 AM

## 2020-10-13 ENCOUNTER — Inpatient Hospital Stay: Payer: BC Managed Care – PPO

## 2020-10-13 MED ORDER — IOHEXOL 300 MG/ML  SOLN
100.0000 mL | Freq: Once | INTRAMUSCULAR | Status: AC | PRN
  Administered 2020-10-13: 100 mL via INTRAVENOUS

## 2020-10-13 MED ORDER — CALCIUM CARBONATE ANTACID 500 MG PO CHEW
1.0000 | CHEWABLE_TABLET | ORAL | Status: DC | PRN
  Administered 2020-10-13 (×2): 200 mg via ORAL
  Filled 2020-10-13 (×2): qty 1

## 2020-10-13 MED ORDER — MENTHOL 3 MG MT LOZG
1.0000 | LOZENGE | OROMUCOSAL | Status: DC | PRN
  Administered 2020-10-13: 3 mg via ORAL
  Filled 2020-10-13: qty 9

## 2020-10-13 MED ORDER — FAMOTIDINE 20 MG PO TABS
20.0000 mg | ORAL_TABLET | Freq: Two times a day (BID) | ORAL | Status: DC
  Administered 2020-10-13 – 2020-10-14 (×2): 20 mg via ORAL
  Filled 2020-10-13 (×2): qty 1

## 2020-10-13 NOTE — Progress Notes (Signed)
Patient to CT via wheelchair.

## 2020-10-14 MED ORDER — AMOXICILLIN-POT CLAVULANATE 875-125 MG PO TABS: 1.0000 | ORAL_TABLET | Freq: Two times a day (BID) | ORAL | 0 refills | Status: AC

## 2020-10-14 MED ORDER — METRONIDAZOLE 500 MG PO TABS: 500.0000 mg | ORAL_TABLET | Freq: Two times a day (BID) | ORAL | 0 refills | Status: DC

## 2020-10-14 NOTE — Progress Notes (Addendum)
Patient ID: Krystal Blackwell, female   DOB: Jun 26, 1981, 40 y.o.   MRN: [id number]      Discharge Summary  Admit date: 10/10/2020  Discharge Date and Time:10/14/2020  11:29 AM  Discharge to:  Home  Admission Diagnosis: Present on Admission: . Sepsis ([place]) . Sepsis after obstetrical procedure                     Discharge  Diagnoses: Active Problems:   Sepsis ([place])   Sepsis after obstetrical procedure   OR Procedures:                                Discharge Day Progress Note:   Subjective:   The patient does not have complaints.  She is ambulating well. She is taking PO well. Her pain is resolved. She is urinating without difficulty and is passing flatus.She strongly desires discharge.   Objective:  BP (!) 125/92 (BP Location: Left Arm)   Pulse 77   Temp 98 F (36.7 C)   Resp 18   Ht 5\' 1"  (1.549 m)   Wt 81.6 kg   LMP 10/09/2020   SpO2 100%   BMI 34.01 kg/m     Abdomen:                           Soft, Nt, BS +    CT from 2/26 - No significant changes.    Afebrile for > 24 hours    Assessment:   Symptomatically well.   afeb   Sepsis resolved   Plan:        Discharge home.                       10 days of antibiotics   FU 1 week    Condition at Discharge:  good Discharge Medications:  Allergies as of 10/14/2020      Reactions   Avelox [moxifloxacin Hcl In Nacl] Swelling   Abilify [aripiprazole] Swelling   Made jaw twitch      Medication List    TAKE these medications   amoxicillin-clavulanate 875-125 MG tablet Commonly known as: Augmentin Take 1 tablet by mouth every 12 (twelve) hours for 10 days.   buPROPion 150 MG 24 hr tablet Commonly known as: Wellbutrin XL Take 1 tablet (150 mg total) by mouth every morning.   etonogestrel-ethinyl estradiol 0.12-0.015 MG/24HR vaginal ring Commonly known as: NUVARING Insert vaginally and leave in place for 3 consecutive weeks, then remove for 1 week.   hydrochlorothiazide 12.5 MG capsule Commonly known  as: Microzide Take 1 capsule (12.5 mg total) by mouth daily.   metroNIDAZOLE 500 MG tablet Commonly known as: FLAGYL Take 1 tablet (500 mg total) by mouth 2 (two) times daily for 10 days.        Follow Up:    Follow-up Information    Harlin Heys, MD Follow up in 1 week(s).   Specialties: Obstetrics and Gynecology, Radiology Contact information: [address] [place] [id number] [phone]              I spent 48 minutes involved in the care of this patient preparing to see the patient by obtaining and reviewing her medical history (including labs, imaging tests and prior procedures), documenting clinical information in the electronic health record (EHR), counseling and coordinating care plans, writing and  sending prescriptions, ordering tests or procedures and directly communicating with the patient by discussing pertinent items from her history and physical exam as well as detailing my assessment and plan as noted above so that she has an informed understanding.  All of her questions were answered.  Finis Bud, M.D. 10/14/2020 11:29 AM

## 2020-10-14 NOTE — Progress Notes (Signed)
Pt discharged home.  Discharge instructions, prescriptions and follow up appointment given to and reviewed with pt.  Pt verbalized understanding.  Escorted by auxillary. 

## 2020-10-14 NOTE — Progress Notes (Signed)
Patient ID: Krystal Blackwell, female   DOB: [birth year], 40 y.o.   MRN: [id number]   Delayed note from 10/13/20 [facility] 9:30AM       HOSPITAL  DAY # 3  Subjective:   The patient does not have complaints.  Took PO liquids and some foods without problem other than one episode of vomiting in AM. Her pain is well controlled with her current medications and she continues to require less medication. She is urinating without difficulty and is passing flatus.   Objective:  BP (!) 125/92 (BP Location: Left Arm)   Pulse 77   Temp 98 F (36.7 C)   Resp 18   Ht 5\' 1"  (1.549 m)   Wt 81.6 kg   LMP 10/09/2020   SpO2 100%   BMI 34.01 kg/m     Abdomen:                          Abdomen soft and nontender without distention  BS+      Tmax 102.3     Assessment:   Symptomatically better, but still spike to 102.3  Plan:        Called ID and discussed case.  Recommend repeat CT. No change in abx or other interventions.   CT ordered.   Finis Bud, M.D. 10/14/2020 11:10 AM

## 2020-10-14 NOTE — Discharge Instructions (Signed)
Sepsis, Self Care, Adult Sepsis is a serious illness that may require intensive care in the hospital. The following information explains what you need to know in order to manage your condition after you are discharged from the hospital. What are the risks? After being treated for sepsis and discharged from the hospital, you may be at a higher risk for certain problems. These problems may be physical or mental. Physical problems:  Weakness and tiredness.  Shortness of breath.  Pain in many areas of the body.  Difficulty walking.  Dry, itchy skin.  Lack of appetite. This may lead to weight loss.  Organ failure. Mental problems:  Difficulty sleeping.  Depression.  Confusion.  Anxiety and worry caused by having gone through a bad experience (post-traumatic stress disorder,PTSD).  Low self-esteem. Follow these instructions at home: Medicines  Take over-the-counter and prescription medicines only as told by your health care provider.  If you were prescribed an antibiotic, antiviral, or antifungal medicine, take it as told by your health care provider. Do not stop taking the medicine even if you start to feel better. Eating and drinking  Eat a healthy diet that includes plenty of vegetables, fruits, whole grains, low-fat dairy products, and lean protein. Ask your health care provider if you should avoid certain foods.  Drink enough fluid to keep your urine pale yellow.   Alcohol use  Do not drink alcohol if: ? Your health care provider tells you not to drink. ? You are pregnant, may be pregnant, or are planning to become pregnant.  If you drink alcohol, limit how much you use to: ? 0-1 drink a day for women. ? 0-2 drinks a day for men.  Be aware of how much alcohol is in your drink. In the [place] one drink equals one 12 oz bottle of beer (355 mL), one 5 oz glass of wine (148 mL), or one 1 oz glass of hard liquor (44 mL). Activity  Rest and gradually return to your  normal activities. Ask your health care provider what activities are safe for you.  Avoid sitting for a long time without moving. Get up to take short walks every 1-2 hours. This is important to improve blood flow and breathing. Ask for help if you feel weak or unsteady.  Try to set small, achievable goals each week, such as dressing yourself, bathing, or walking up the stairs. It may take a while to rebuild your strength.  Try to exercise regularly if you feel healthy enough to do so. Ask your health care provider what exercises are safe for you. Preventing infection  Keep your vaccinations up to date. Get the flu shot every year.  Wash your hands often using soap and water. Use hand sanitizer if soap and water are not available.  Practice good hygiene. Keep cuts clean and covered until healed.   Managing stress Talk with your health care provider or counselor about ways to reduce stress. He or she may suggest:  Meditation, muscle relaxation, and breathing exercises.  Talk therapy.  Spending time on hobbies and activities that you enjoy. General instructions  Get the right amount and quality of sleep. Most adults need 7-9 hours of sleep each night. To help with sleep: ? Keep your bedroom cool and dark. ? Do not eat a heavy meal within one hour of bedtime. ? Do not drink alcohol or caffeinated drinks before bed. ? Avoid screen time, such as television, computers, tablets, or cell phones before bed.  Do not use  any products that contain nicotine or tobacco, such as cigarettes, e-cigarettes, and chewing tobacco. If you need help quitting, ask your health care provider.  Talk to trusted family members and friends about your condition. Explain your symptoms to them, and let them know that you are working with a health care provider to treat your condition. This can provide you with one way to get support and guidance.  Keep all follow-up visits as told by your health care provider. This  is important. Questions to ask your health care provider:  What physical and emotional changes do I need to report?  Do I need to have someone with me all the time?  Is it safe for me to drive? Contact a health care provider if you:  Do not feel like you are getting better or regaining strength.  Have muscle or joint pain.  Frequently feel tired.  Are having trouble coping with your recovery.  Have nightmares, or trouble falling asleep or staying asleep.  Feel sad, down, or depressed more often than not, every day for more than 2 weeks.  Have difficulty concentrating.  Feel irritable or you cry for no reason. Get help right away if you:  Have difficulty breathing.  Have a rapid or skipping heartbeat.  Become confused or disoriented.  See, hear, or feel things that do not exist (hallucinations).  Have a high fever.  Have an infection that is getting worse or not getting better.  You have thoughts of hurting yourself or others. If you ever feel like you may hurt yourself or others, or have thoughts about taking your own life, get help right away. You can go to your nearest emergency department or call:  Your local emergency services (911 in the [place]  A suicide crisis helpline, such as the [facility] at 706-201-1411. This is open 24 hours a day. Summary  Sepsis is a serious illness that may require intensive care in a hospital. You may experience long-term health effects after you are discharged from the hospital.  Try to set small, achievable goals each week, such as dressing yourself, bathing, or walking up the stairs. It may take a while to rebuild your strength.  Keep all follow-up visits as told by your health care provider. This is important.  Know what symptoms you should get help right away for. This information is not intended to replace advice given to you by your health care provider. Make sure you discuss any questions you  have with your health care provider. Document Revised: 03/12/2018 Document Reviewed: 03/12/2018 Elsevier Patient Education  [place].

## 2020-10-15 ENCOUNTER — Telehealth: Payer: Self-pay | Admitting: Obstetrics and Gynecology

## 2020-10-15 LAB — CULTURE, BLOOD (ROUTINE X 2)
Culture: NO GROWTH
Culture: NO GROWTH
Special Requests: ADEQUATE
Special Requests: ADEQUATE

## 2020-10-15 NOTE — Telephone Encounter (Signed)
New message    Pt c/o medication issue:  1. Name of Medication: Flagyl   2. How are you currently taking this medication (dosage and times per day)? 500 mg twice a day   3. Are you having a reaction (difficulty breathing--STAT)? No   4. What is your medication issue? Per patient makes not herself and nauseate

## 2020-10-15 NOTE — Telephone Encounter (Signed)
Pt states she can not take flagyl. It makes her nauseated and feel blah. She needs to be about to function.   Pt advised to stop the mediation. She is aware [facility] will get this message 3/1.   Pls advise.

## 2020-10-16 ENCOUNTER — Other Ambulatory Visit: Payer: Self-pay | Admitting: Obstetrics and Gynecology

## 2020-10-16 ENCOUNTER — Telehealth: Payer: Self-pay | Admitting: Obstetrics and Gynecology

## 2020-10-16 DIAGNOSIS — O0480 (Induced) termination of pregnancy with unspecified complications: Secondary | ICD-10-CM

## 2020-10-16 MED ORDER — SULFAMETHOXAZOLE-TRIMETHOPRIM 800-160 MG PO TABS
1.0000 | ORAL_TABLET | Freq: Two times a day (BID) | ORAL | 1 refills | Status: DC
Start: 2020-10-16 — End: 2020-11-22

## 2020-10-16 NOTE — Telephone Encounter (Signed)
Pt aware.

## 2020-10-16 NOTE — Telephone Encounter (Signed)
Pt aware letter left up front for pick up.

## 2020-10-16 NOTE — Telephone Encounter (Signed)
New message    Patient need a note for work due to hospitalization 2.23.22 to 2.28.22.

## 2020-10-23 ENCOUNTER — Encounter: Payer: Self-pay | Admitting: Obstetrics and Gynecology

## 2020-10-23 ENCOUNTER — Other Ambulatory Visit: Payer: Self-pay

## 2020-10-23 ENCOUNTER — Ambulatory Visit (INDEPENDENT_AMBULATORY_CARE_PROVIDER_SITE_OTHER): Payer: Self-pay | Admitting: Obstetrics and Gynecology

## 2020-10-23 VITALS — BP 174/70 | HR 105 | Ht 61.0 in | Wt 169.2 lb

## 2020-10-23 DIAGNOSIS — A419 Sepsis, unspecified organism: Secondary | ICD-10-CM

## 2020-10-23 DIAGNOSIS — O0480 (Induced) termination of pregnancy with unspecified complications: Secondary | ICD-10-CM

## 2020-10-23 NOTE — Progress Notes (Signed)
HPI:      [name] is a 40 y.o. G3P1001 who LMP was Patient's last menstrual period was 10/09/2020.  Subjective:   She presents today stating that she feels much better.  She has her energy back.  She has no further pelvic pain.  Bowel movements and urination without difficulty.  She has 2 days of antibiotics left.  Denies fevers chills etc.  She has restarted NuvaRing.  She has returned to work.    Hx: The following portions of the patient's history were reviewed and updated as appropriate:             She  has a past medical history of Blood transfusion without reported diagnosis, Depression, Diverticulitis, Diverticulosis, Ovarian cyst, Pelvic pain in female, Recurrent sinus infections, and Scoliosis. She does not have any pertinent problems on file. She  has a past surgical history that includes Spine surgery (29,52,84,13) and Nasal septum surgery (11/2013). Her family history includes Alcohol abuse in her father; Arthritis in her maternal grandmother; Asthma in her mother; Drug abuse in her father; Hypertension in her mother; Miscarriages / Stillbirths in her mother; Pancreatic cancer in her father. She  reports that she has been smoking. She has never used smokeless tobacco. She reports previous alcohol use of about 6.0 standard drinks of alcohol per week. She reports that she does not use drugs. She has a current medication list which includes the following prescription(s): amoxicillin-clavulanate, bupropion, etonogestrel-ethinyl estradiol, sulfamethoxazole-trimethoprim, and hydrochlorothiazide. She is allergic to avelox [moxifloxacin hcl in nacl] and abilify [aripiprazole].       Review of Systems:  Review of Systems  Constitutional: Denied constitutional symptoms, night sweats, recent illness, fatigue, fever, insomnia and weight loss.  Eyes: Denied eye symptoms, eye pain, photophobia, vision change and visual disturbance.  Ears/Nose/Throat/Neck: Denied ear, nose, throat or  neck symptoms, hearing loss, nasal discharge, sinus congestion and sore throat.  Cardiovascular: Denied cardiovascular symptoms, arrhythmia, chest pain/pressure, edema, exercise intolerance, orthopnea and palpitations.  Respiratory: Denied pulmonary symptoms, asthma, pleuritic pain, productive sputum, cough, dyspnea and wheezing.  Gastrointestinal: Denied, gastro-esophageal reflux, melena, nausea and vomiting.  Genitourinary: Denied genitourinary symptoms including symptomatic vaginal discharge, pelvic relaxation issues, and urinary complaints.  Musculoskeletal: Denied musculoskeletal symptoms, stiffness, swelling, muscle weakness and myalgia.  Dermatologic: Denied dermatology symptoms, rash and scar.  Neurologic: Denied neurology symptoms, dizziness, headache, neck pain and syncope.  Psychiatric: Denied psychiatric symptoms, anxiety and depression.  Endocrine: Denied endocrine symptoms including hot flashes and night sweats.   Meds:   Current Outpatient Medications on File Prior to Visit  Medication Sig Dispense Refill  . amoxicillin-clavulanate (AUGMENTIN) 875-125 MG tablet Take 1 tablet by mouth every 12 (twelve) hours for 10 days. 20 tablet 0  . buPROPion (WELLBUTRIN XL) 150 MG 24 hr tablet Take 1 tablet (150 mg total) by mouth every morning. 30 tablet 1  . etonogestrel-ethinyl estradiol (NUVARING) 0.12-0.015 MG/24HR vaginal ring Insert vaginally and leave in place for 3 consecutive weeks, then remove for 1 week. 1 each 0  . sulfamethoxazole-trimethoprim (BACTRIM DS) 800-160 MG tablet Take 1 tablet by mouth 2 (two) times daily. 14 tablet 1  . hydrochlorothiazide (MICROZIDE) 12.5 MG capsule Take 1 capsule (12.5 mg total) by mouth daily. 30 capsule 0   No current facility-administered medications on file prior to visit.          Objective:     Vitals:   10/23/20 0950  BP: (!) 174/70  Pulse: (!) 105   [employer]  10/23/20 0950  Weight: 169 lb 3.2 oz (76.7 kg)                 Assessment:    G3P1001 Patient Active Problem List   Diagnosis Date Noted  . Sepsis after obstetrical procedure 10/11/2020  . Sepsis ([place]) 10/10/2020  . Bipolar 2 disorder ([place]) 11/02/2019  . Diverticulitis of colon 06/16/2016  . 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 Vitamin D deficiency 06/19/2014  . Depression with anxiety      1. Complication following medical abortion   2. Sepsis, due to unspecified organism, unspecified whether acute organ dysfunction present [facility])     Patient doing very well at this time.  No further issues with pelvic infection.  Hypertension noted.  Patient is on antihypertensives and will get this checked several times in the next few days "at work".   Plan:            1.  Complete antibiotics.  2.  Hypertension checks  3.  Follow-up in near future for annual exam. Orders No orders of the defined types were placed in this encounter.   No orders of the defined types were placed in this encounter.     F/U  Return for Annual Physical. I spent 12 minutes involved in the care of this patient preparing to see the patient by obtaining and reviewing her medical history (including labs, imaging tests and prior procedures), documenting clinical information in the electronic health record (EHR), counseling and coordinating care plans, writing and sending prescriptions, ordering tests or procedures and directly communicating with the patient by discussing pertinent items from her history and physical exam as well as detailing my assessment and plan as noted above so that she has an informed understanding.  All of her questions were answered.  Finis Bud, M.D. 10/23/2020 10:27 AM

## 2020-11-22 ENCOUNTER — Encounter: Payer: Self-pay | Admitting: Obstetrics and Gynecology

## 2020-11-22 ENCOUNTER — Ambulatory Visit (INDEPENDENT_AMBULATORY_CARE_PROVIDER_SITE_OTHER): Payer: BC Managed Care – PPO | Admitting: Obstetrics and Gynecology

## 2020-11-22 ENCOUNTER — Other Ambulatory Visit: Payer: Self-pay

## 2020-11-22 VITALS — BP 122/80 | HR 85 | Ht 61.0 in | Wt 171.9 lb

## 2020-11-22 DIAGNOSIS — Z1231 Encounter for screening mammogram for malignant neoplasm of breast: Secondary | ICD-10-CM | POA: Diagnosis not present

## 2020-11-22 DIAGNOSIS — Z01419 Encounter for gynecological examination (general) (routine) without abnormal findings: Secondary | ICD-10-CM | POA: Diagnosis not present

## 2020-11-22 MED ORDER — ETONOGESTREL-ETHINYL ESTRADIOL 0.12-0.015 MG/24HR VA RING: VAGINAL_RING | VAGINAL | 3 refills | Status: DC

## 2020-11-22 NOTE — Progress Notes (Signed)
HPI:      [name] is a 40 y.o. [phone] who LMP was Patient's last menstrual period was 11/08/2020.  Subjective:   She presents today for her annual examination.  She has no residual problems from her previous infection.  She says that she has returned to normal activities.  She is using NuvaRing for birth control and would like to continue that. She reports no family history of breast cancer.    Hx: The following portions of the patient's history were reviewed and updated as appropriate:             She  has a past medical history of Blood transfusion without reported diagnosis, Depression, Diverticulitis, Diverticulosis, Ovarian cyst, Pelvic pain in female, Recurrent sinus infections, and Scoliosis. She does not have any pertinent problems on file. She  has a past surgical history that includes Spine surgery (46,56,81,27) and Nasal septum surgery (11/2013). Her family history includes Alcohol abuse in her father; Arthritis in her maternal grandmother; Asthma in her mother; Drug abuse in her father; Hypertension in her mother; Miscarriages / Stillbirths in her mother; Pancreatic cancer in her father. She  reports that she has been smoking. She has never used smokeless tobacco. She reports previous alcohol use of about 6.0 standard drinks of alcohol per week. She reports that she does not use drugs. She has a current medication list which includes the following prescription(s): bupropion, etonogestrel-ethinyl estradiol, etonogestrel-ethinyl estradiol, and hydrochlorothiazide. She is allergic to avelox [moxifloxacin hcl in nacl] and abilify [aripiprazole].       Review of Systems:  Review of Systems  Constitutional: Denied constitutional symptoms, night sweats, recent illness, fatigue, fever, insomnia and weight loss.  Eyes: Denied eye symptoms, eye pain, photophobia, vision change and visual disturbance.  Ears/Nose/Throat/Neck: Denied ear, nose, throat or neck symptoms, hearing  loss, nasal discharge, sinus congestion and sore throat.  Cardiovascular: Denied cardiovascular symptoms, arrhythmia, chest pain/pressure, edema, exercise intolerance, orthopnea and palpitations.  Respiratory: Denied pulmonary symptoms, asthma, pleuritic pain, productive sputum, cough, dyspnea and wheezing.  Gastrointestinal: Denied, gastro-esophageal reflux, melena, nausea and vomiting.  Genitourinary: Denied genitourinary symptoms including symptomatic vaginal discharge, pelvic relaxation issues, and urinary complaints.  Musculoskeletal: Denied musculoskeletal symptoms, stiffness, swelling, muscle weakness and myalgia.  Dermatologic: Denied dermatology symptoms, rash and scar.  Neurologic: Denied neurology symptoms, dizziness, headache, neck pain and syncope.  Psychiatric: Denied psychiatric symptoms, anxiety and depression.  Endocrine: Denied endocrine symptoms including hot flashes and night sweats.   Meds:   Current Outpatient Medications on File Prior to Visit  Medication Sig Dispense Refill  . buPROPion (WELLBUTRIN XL) 150 MG 24 hr tablet Take 1 tablet (150 mg total) by mouth every morning. 30 tablet 1  . etonogestrel-ethinyl estradiol (NUVARING) 0.12-0.015 MG/24HR vaginal ring Insert vaginally and leave in place for 3 consecutive weeks, then remove for 1 week. 1 each 0  . hydrochlorothiazide (MICROZIDE) 12.5 MG capsule Take 1 capsule (12.5 mg total) by mouth daily. 30 capsule 0   No current facility-administered medications on file prior to visit.       The pregnancy intention screening data noted above was reviewed. Potential methods of contraception were discussed. The patient elected to proceed with Vaginal Ring.     Objective:     Vitals:   11/22/20 0810  BP: 122/80  Pulse: 85   Filed Weights   11/22/20 0810  Weight: 171 lb 14.4 oz (78 kg)  Physical examination General NAD, Conversant  HEENT Atraumatic; Op clear with mmm.  Normo-cephalic. Pupils  reactive. Anicteric sclerae  Thyroid/Neck Smooth without nodularity or enlargement. Normal ROM.  Neck Supple.  Skin No rashes, lesions or ulceration. Normal palpated skin turgor. No nodularity.  Breasts: No masses or discharge.  Symmetric.  No axillary adenopathy.  Lungs: Clear to auscultation.No rales or wheezes. Normal Respiratory effort, no retractions.  Heart: NSR.  No murmurs or rubs appreciated. No periferal edema  Abdomen: Soft.  Non-tender.  No masses.  No HSM. No hernia  Extremities: Moves all appropriately.  Normal ROM for age. No lymphadenopathy.  Neuro: Oriented to PPT.  Normal mood. Normal affect.     Pelvic:   Vulva: Normal appearance.  No lesions.  Vagina: No lesions or abnormalities noted.  Support: Normal pelvic support.  Urethra No masses tenderness or scarring.  Meatus Normal size without lesions or prolapse.  Cervix: Normal appearance.  No lesions.  Anus: Normal exam.  No lesions.  Perineum: Normal exam.  No lesions.        Bimanual   Uterus: Normal size.  Non-tender.  Mobile.  AV.  Adnexae: No masses.  Non-tender to palpation.  Cul-de-sac: Negative for abnormality.      Assessment:    [id number] Patient Active Problem List   Diagnosis Date Noted  . Sepsis after obstetrical procedure 10/11/2020  . Sepsis ([place]) 10/10/2020  . Bipolar 2 disorder ([place]) 11/02/2019  . Diverticulitis of colon 06/16/2016  . 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 Vitamin D deficiency 06/19/2014  . Depression with anxiety      1. Well woman exam with routine gynecological exam   2. Encounter for screening mammogram for malignant neoplasm of breast        Plan:            1.  Basic Screening Recommendations The basic screening recommendations for asymptomatic women were discussed with the patient during her visit.  The age-appropriate recommendations were discussed with her and the rational for the tests reviewed.  When I am informed by the patient that another primary care  physician has previously obtained the age-appropriate tests and they are up-to-date, only outstanding tests are ordered and referrals given as necessary.  Abnormal results of tests will be discussed with her when all of her results are completed.  Routine preventative health maintenance measures emphasized: Exercise/Diet/Weight control, Tobacco Warnings, Alcohol/Substance use risks and Stress Management Baseline mammogram ordered-patient to return for fasting blood work. 2.  Continue NuvaRing for birth control Orders Orders Placed This Encounter  Procedures  . MM DIGITAL SCREENING BILATERAL  . CBC  . Glucose, random  . Hemoglobin A1c  . TSH  . Lipid panel     Meds ordered this encounter  Medications  . etonogestrel-ethinyl estradiol (NUVARING) 0.12-0.015 MG/24HR vaginal ring    Sig: Insert vaginally and leave in place for 3 consecutive weeks, then remove for 1 week.    Dispense:  3 each    Refill:  3      F/U  No follow-ups on file.  Finis Bud, M.D. 11/22/2020 8:42 AM

## 2021-06-14 ENCOUNTER — Encounter: Payer: BC Managed Care – PPO | Admitting: Certified Nurse Midwife

## 2021-06-14 ENCOUNTER — Encounter: Payer: Self-pay | Admitting: Certified Nurse Midwife

## 2021-06-14 DIAGNOSIS — Z3480 Encounter for supervision of other normal pregnancy, unspecified trimester: Secondary | ICD-10-CM

## 2021-06-25 ENCOUNTER — Telehealth: Payer: Self-pay | Admitting: Certified Nurse Midwife

## 2021-06-25 NOTE — Telephone Encounter (Signed)
Pt called stating that she has been having shortness of breath, fever for a few days, now chills, congestion, wet / productive cough stomach muscle pains on right, dizziness. Pt is currently 5 months pregnant. No PCP. Pt has attempted to treat with cough drops and OTC decongestants. Please Advise.  Cancelled pt's apt on Friday 11-11. Would you want to do a Video Visit instead?

## 2021-06-28 ENCOUNTER — Observation Stay
Admission: EM | Admit: 2021-06-28 | Discharge: 2021-06-28 | Disposition: A | Discharge status: Home / Self Care | Payer: BC Managed Care – PPO | Attending: Obstetrics and Gynecology | Admitting: Obstetrics and Gynecology

## 2021-06-28 ENCOUNTER — Other Ambulatory Visit: Payer: Self-pay

## 2021-06-28 ENCOUNTER — Encounter: Payer: Self-pay | Admitting: Obstetrics and Gynecology

## 2021-06-28 ENCOUNTER — Observation Stay: Payer: BC Managed Care – PPO

## 2021-06-28 ENCOUNTER — Encounter: Payer: BC Managed Care – PPO | Admitting: Certified Nurse Midwife

## 2021-06-28 DIAGNOSIS — Z3A25 25 weeks gestation of pregnancy: Secondary | ICD-10-CM | POA: Diagnosis not present

## 2021-06-28 DIAGNOSIS — O09523 Supervision of elderly multigravida, third trimester: Secondary | ICD-10-CM

## 2021-06-28 DIAGNOSIS — O0930 Supervision of pregnancy with insufficient antenatal care, unspecified trimester: Secondary | ICD-10-CM

## 2021-06-28 DIAGNOSIS — O99512 Diseases of the respiratory system complicating pregnancy, second trimester: Secondary | ICD-10-CM

## 2021-06-28 DIAGNOSIS — O368120 Decreased fetal movements, second trimester, not applicable or unspecified: Secondary | ICD-10-CM | POA: Diagnosis not present

## 2021-06-28 DIAGNOSIS — O0932 Supervision of pregnancy with insufficient antenatal care, second trimester: Secondary | ICD-10-CM | POA: Diagnosis not present

## 2021-06-28 DIAGNOSIS — J111 Influenza due to unidentified influenza virus with other respiratory manifestations: Secondary | ICD-10-CM

## 2021-06-28 DIAGNOSIS — O0933 Supervision of pregnancy with insufficient antenatal care, third trimester: Secondary | ICD-10-CM

## 2021-06-28 DIAGNOSIS — O219 Vomiting of pregnancy, unspecified: Secondary | ICD-10-CM | POA: Diagnosis not present

## 2021-06-28 DIAGNOSIS — Z3490 Encounter for supervision of normal pregnancy, unspecified, unspecified trimester: Secondary | ICD-10-CM

## 2021-06-28 LAB — URINALYSIS, COMPLETE (UACMP) WITH MICROSCOPIC
Bacteria, UA: NONE SEEN
Bilirubin Urine: NEGATIVE
Glucose, UA: NEGATIVE mg/dL
Hgb urine dipstick: NEGATIVE
Ketones, ur: NEGATIVE mg/dL
Nitrite: NEGATIVE
Protein, ur: NEGATIVE mg/dL
Specific Gravity, Urine: 1.016 (ref 1.005–1.030)
pH: 6 (ref 5.0–8.0)

## 2021-06-28 MED ORDER — SODIUM CHLORIDE 0.9 % IV SOLN
Freq: Once | INTRAVENOUS | Status: AC
  Administered 2021-06-28: 1000 mL via INTRAVENOUS

## 2021-06-28 MED ORDER — ONDANSETRON 4 MG PO TBDP
4.0000 mg | ORAL_TABLET | Freq: Four times a day (QID) | ORAL | Status: DC | PRN
  Administered 2021-06-28: 4 mg via ORAL
  Filled 2021-06-28: qty 1

## 2021-06-28 MED ORDER — ONDANSETRON 4 MG PO TBDP: 4.0000 mg | ORAL_TABLET | Freq: Four times a day (QID) | ORAL | 0 refills | Status: DC | PRN

## 2021-06-28 MED ORDER — OSELTAMIVIR PHOSPHATE 75 MG PO CAPS
75.0000 mg | ORAL_CAPSULE | Freq: Two times a day (BID) | ORAL | Status: DC
  Administered 2021-06-28: 75 mg via ORAL
  Filled 2021-06-28: qty 1

## 2021-06-28 MED ORDER — GUAIFENESIN-CODEINE 100-10 MG/5ML PO SOLN
10.0000 mL | Freq: Four times a day (QID) | ORAL | Status: DC | PRN
  Administered 2021-06-28: 10 mL via ORAL
  Filled 2021-06-28 (×3): qty 10

## 2021-06-28 MED ORDER — LABETALOL HCL 100 MG PO TABS: 100.0000 mg | ORAL_TABLET | Freq: Two times a day (BID) | ORAL | 2 refills | Status: DC

## 2021-06-28 MED ORDER — OSELTAMIVIR PHOSPHATE 75 MG PO CAPS: 75.0000 mg | ORAL_CAPSULE | Freq: Two times a day (BID) | ORAL | 0 refills | Status: DC

## 2021-06-28 NOTE — OB Triage Note (Signed)
Pt discharged home per order.  Pt stable and ambulatory and an After Visit Summary was printed and given to the patient. Discharge education completed with patient/family including follow up instructions, appointments, and medication list. Pt reports positive fetal movement and no contractions. Pt received labor and bleeding precautions. Patient able to verbalize understanding, all questions fully answered upon discharge. Pt discharged home via personal vehicle.

## 2021-06-28 NOTE — OB Triage Note (Signed)
Patient is a 40 yo, G5P3, with unknown gestational age. Patient states her last menstrual period was 5/22 but also reports that she "bled all through June and July." Patient presents with complaints of decreased fetal movement, Influenza A+, SOB, Nausea and Vomiting, and states she is unable to keep food or fluids down. Patient denies any vaginal bleeding, LOF, or ctx. Monitors applied and assessing. VSS. Initial fetal heart tone 140. Will continue to monitor.

## 2021-06-28 NOTE — Final Progress Note (Signed)
L&D OB Triage Note  HPI:  Krystal Blackwell is a 40 y.o. [phone] female with LMP unsure (thinks 01/06/2021, however notes abnormal bleeding in June and July),with EDD unknown who presented to trriage with complaints of decreased fetal movement, nausea and vomiting, unable to keep food or fluids down, and recent history of Influenza infection (tested positive 2 days ago at [facility], was given a Z-pack for suspected bronchitis and URI. Reports she has take 2 days of this..  She denies vaginal bleeding, leaking fluids, or contractions.  She has not received prenatal care I this pregnancy thus far.  She has a prior history of essential HTNShe was evaluated by the nurses. Vital signs stable. An NST was performed and has been reviewed by MD. She was treated with IVF bolus, PO Zofran, and Tamiflu.    Exam:  Blood pressure 117/72, pulse 79, temperature 98 F (36.7 C), temperature source Oral, resp. rate 20, last menstrual period 11/08/2020, currently breastfeeding.  NST INTERPRETATION: Indications: decreased fetal movement, patient reassurance, and rule out uterine contractions  Mode: Doppler Baseline Rate (A): 140 bpm (fht) Variability: Moderate Accelerations: Present Decelerations: Absent     Contraction Frequency (min): none  Impression: reactive    Labs:  Results for orders placed or performed during the hospital encounter of 06/28/21  Urinalysis, Complete w Microscopic Urine, Clean Catch  Result Value Ref Range   Color, Urine YELLOW (A) YELLOW   APPearance HAZY (A) CLEAR   Specific Gravity, Urine 1.016 1.005 - 1.030   pH 6.0 5.0 - 8.0   Glucose, UA NEGATIVE NEGATIVE mg/dL   Hgb urine dipstick NEGATIVE NEGATIVE   Bilirubin Urine NEGATIVE NEGATIVE   Ketones, ur NEGATIVE NEGATIVE mg/dL   Protein, ur NEGATIVE NEGATIVE mg/dL   Nitrite NEGATIVE NEGATIVE   Leukocytes,Ua TRACE (A) NEGATIVE   RBC / HPF 0-5 0 - 5 RBC/hpf   WBC, UA 0-5 0 - 5 WBC/hpf   Bacteria, UA NONE SEEN NONE  SEEN   Squamous Epithelial / LPF 0-5 0 - 5   Mucus PRESENT     Review in Care Everywhere reveals that patient tested positive for Influenza A 2 days ago.    Imaging:  US OB Comp + 14 Wk CLINICAL DATA:  No prenatal care.  Decreased fetal movement.  EXAM: OBSTETRICAL ULTRASOUND >14 WKS  FINDINGS: Number of Fetuses: 1  Heart Rate:  150 bpm  Movement: Yes  Presentation: Breech  Previa: No  Placental Location: Left lateral placenta extending from anterior to posterior. Succenturiate placenta with a left posterior lobe.  Amniotic Fluid (Subjective): Normal  Amniotic Fluid (Objective):  Vertical pocket = 4.1cm  FETAL BIOMETRY  BPD: 6.5cm 26w 2d  HC: 24.4cm  26w   4d  AC: 20.6cm  25w   1d  FL: 4.6cm  25w   2d  Current Mean GA: 25w 4d                     [place] EDC: 10/07/2021  FETAL ANATOMY  Lateral Ventricles: Appears normal  Thalami/CSP: Appears normal  Posterior Fossa:  Appears normal  Nuchal Region: Not visualized   NFT= N/A > 20 WKS  Upper Lip: Appears normal  Spine: Appears normal  4 Chamber Heart on Left: Appears normal  LVOT: Appears normal  RVOT: Appears normal  Stomach on Left: Appears normal  3 Vessel Cord: Appears normal  Cord Insertion site: Appears normal  Kidneys: Appears normal  Bladder: Appears normal  Extremities: Appears normal  Sex:  Female  Technically difficult due to: Fetal position  Maternal Findings:  Cervix:  4.2 cm  IMPRESSION: 1. Single live intrauterine pregnancy as detailed above. Nuchal region is not visualized. Otherwise, no focal fetal anatomic abnormality.  Electronically Signed   By: Kathreen Devoid M.D.   On: 06/28/2021 10:44  Plan: NST performed was reviewed and was found to be reactive. Tamiflu was prescribed  for treatment of the flu. Given a prescription for Zofran for nausea/vomiting. She can continue her Azithromycin. She was discharged home with bleeding/labor precautions.  Strongly encouraged to  establish prenatal care. Follow up with OB/GYN in 2 weeks.    Rubie Maid, MD [facility]

## 2021-06-28 NOTE — Progress Notes (Signed)
Pt returned from Ultrasound.

## 2021-07-08 ENCOUNTER — Other Ambulatory Visit: Payer: Self-pay

## 2021-07-09 ENCOUNTER — Other Ambulatory Visit: Payer: BC Managed Care – PPO

## 2021-07-09 ENCOUNTER — Encounter: Payer: BC Managed Care – PPO | Admitting: Obstetrics and Gynecology

## 2021-07-18 ENCOUNTER — Encounter: Payer: Self-pay | Admitting: Obstetrics and Gynecology

## 2021-08-14 ENCOUNTER — Telehealth: Payer: Self-pay | Admitting: Obstetrics and Gynecology

## 2021-08-14 NOTE — Telephone Encounter (Signed)
Patient is 8 months pregnant and was seen at the ER. She was told that she needed to follow up with and OB office. Please advise to when patient would need to be scheduled.   CB# [phone]

## 2021-08-14 NOTE — Telephone Encounter (Signed)
She can come on my schedule Jan 4th at 11:30. Please make sure she is aware that she will need to do her glucola and other labs at that visit, so probably needs to come in around 10:15 or 10:30.

## 2021-08-18 NOTE — L&D Delivery Note (Signed)
Delivery Summary for Krystal Blackwell  Labor Events:   Preterm labor: No data found  Rupture date: No data found  Rupture time: No data found  Rupture type: No data found  Fluid Color: No data found  Induction: No data found  Augmentation: No data found  Complications: No data found  Cervical ripening: No data found No data found   No data found     Delivery:   Episiotomy: No data found  Lacerations: No data found  Repair suture: No data found  Repair # of packets: No data found  Blood loss (ml): 100   Information for the patient's newborn:  [name], [name] [[id number]]   Delivery 09/27/2021 2:32 PM by  Vaginal, Spontaneous Sex:  female Gestational Age: [redacted]w[redacted]d Delivery Clinician:   Living?:         APGARS  One minute Five minutes Ten minutes  Skin color:        Heart rate:        Grimace:        Muscle tone:        Breathing:        Totals: 7  8      Presentation/position:      Resuscitation:   Cord information:    Disposition of cord blood:     Blood gases sent?  Complications:   Placenta: Delivered:       appearance Newborn Measurements: Weight: 6 lb 4.5 oz (2850 g)  Height: 19.69"  Head circumference:    Chest circumference:    Other providers:    Additional  information: Forceps:   Vacuum:   Breech:   Observed anomalies      Delivery Note At 2:32 PM a viable and healthy female was delivered via Vaginal, Spontaneous (Presentation: Vertex).  Delivery was performed by nurse due to precipitous labor after epidural placement. I arrived within 3 minutes after birth. APGAR: 7, 8; weight 2850 grams.   Placenta status: Spontaneous, Intact. Succenturiate lobe noted on placenta. Cord: 3 vessels with the following complications: Nuchal cord x 1, reduced after delivery of the fetal body.  Cord pH: not obtained.  Delayed cord clamping observed.   Anesthesia: Epidural Episiotomy: None Lacerations: None Suture Repair:  None Est. Blood Loss (mL): 100  Mom to postpartum.   Baby to Couplet care / Skin to Skin.  Rubie Maid, MD 09/27/2021, 2:48 PM

## 2021-08-21 ENCOUNTER — Other Ambulatory Visit: Payer: Self-pay

## 2021-08-21 ENCOUNTER — Ambulatory Visit (INDEPENDENT_AMBULATORY_CARE_PROVIDER_SITE_OTHER): Payer: BC Managed Care – PPO | Admitting: Obstetrics and Gynecology

## 2021-08-21 ENCOUNTER — Other Ambulatory Visit: Payer: BC Managed Care – PPO

## 2021-08-21 ENCOUNTER — Encounter: Payer: Self-pay | Admitting: Obstetrics and Gynecology

## 2021-08-21 VITALS — BP 124/88 | HR 72 | Wt 169.0 lb

## 2021-08-21 DIAGNOSIS — O43193 Other malformation of placenta, third trimester: Secondary | ICD-10-CM | POA: Diagnosis not present

## 2021-08-21 DIAGNOSIS — F418 Other specified anxiety disorders: Secondary | ICD-10-CM

## 2021-08-21 DIAGNOSIS — O0933 Supervision of pregnancy with insufficient antenatal care, third trimester: Secondary | ICD-10-CM

## 2021-08-21 DIAGNOSIS — O0993 Supervision of high risk pregnancy, unspecified, third trimester: Secondary | ICD-10-CM

## 2021-08-21 DIAGNOSIS — G479 Sleep disorder, unspecified: Secondary | ICD-10-CM

## 2021-08-21 DIAGNOSIS — O26893 Other specified pregnancy related conditions, third trimester: Secondary | ICD-10-CM

## 2021-08-21 DIAGNOSIS — O219 Vomiting of pregnancy, unspecified: Secondary | ICD-10-CM | POA: Diagnosis not present

## 2021-08-21 DIAGNOSIS — Z23 Encounter for immunization: Secondary | ICD-10-CM

## 2021-08-21 DIAGNOSIS — Z3A33 33 weeks gestation of pregnancy: Secondary | ICD-10-CM

## 2021-08-21 DIAGNOSIS — Z131 Encounter for screening for diabetes mellitus: Secondary | ICD-10-CM

## 2021-08-21 DIAGNOSIS — O10913 Unspecified pre-existing hypertension complicating pregnancy, third trimester: Secondary | ICD-10-CM | POA: Insufficient documentation

## 2021-08-21 DIAGNOSIS — O10919 Unspecified pre-existing hypertension complicating pregnancy, unspecified trimester: Secondary | ICD-10-CM

## 2021-08-21 DIAGNOSIS — O99343 Other mental disorders complicating pregnancy, third trimester: Secondary | ICD-10-CM

## 2021-08-21 LAB — POCT URINALYSIS DIPSTICK OB
Bilirubin, UA: NEGATIVE
Blood, UA: NEGATIVE
Glucose, UA: NEGATIVE
Ketones, UA: NEGATIVE
Leukocytes, UA: NEGATIVE
Nitrite, UA: NEGATIVE
POC,PROTEIN,UA: NEGATIVE
Spec Grav, UA: 1.025 (ref 1.010–1.025)
Urobilinogen, UA: NEGATIVE E.U./dL — AB
pH, UA: 6.5 (ref 5.0–8.0)

## 2021-08-21 NOTE — Progress Notes (Signed)
OBSTETRIC INITIAL PRENATAL VISIT  Subjective:    Krystal Blackwell is being seen today for her first obstetrical visit.  This  was not a planned pregnancy. She is a 41 y.o. [id number] female at [redacted]w[redacted]d gestation, Estimated Date of Delivery: 10/07/21 with Patient's last menstrual period was 11/08/2020.,  inconsistent with 25 week sono. Her obstetrical history is significant for advanced maternal age, obesity (mild), no prenatal care, h/o depression and anxiety, and chronic HTN. Relationship with FOB: significant other, living together. Patient does intend to breast feed. Pregnancy history fully reviewed.   Patient states that she did not seek care initially for several reasons, including social (lots of things going on at home), also was in denial about the pregnancy in the beginning.   She also reports recently being seen last week in a hospital while out of town in [place] due to hyperemesis and dehydration.  Still has intermittent episodes where she will go days to weeks with nausea/vomiting. Was given a prescription for 8 mg of Zofran after receiving IV hydration and IV anti-emetics. Notes that she previously had a prescription for 4 mg dosing.   Lastly, patient was seen at [facility] triage in November and was diagnosed with the flu.    OB History  Gravida Para Term Preterm AB Living  5 3 3  0 1 3  SAB IAB Ectopic Multiple Live Births  0 0 0 0 3    # Outcome Date GA Lbr Len/2nd Weight Sex Delivery Anes PTL Lv  5 Current           4 AB 08/01/20          3 Term 05/29/18 [redacted]w[redacted]d / 00:28 7 lb 14.6 oz (3.59 kg) F Vag-Spont EPI  LIV     Name: RYANNE, MORAND     Apgar1: 8  Apgar5: 9  2 Term 12/22/06    M Vag-Spont   LIV  1 Term 12/21/00    M Vag-Spont   LIV    Obstetric Comments  Please do not discuss previous pregnancy/abortion with other people in room per pt.     Gynecologic History:  Last pap smear was 03/07/2019.  Results were Normal. Denies h/o abnormal pap smears in the past.   Denies history of STIs.  Contraception prior to conception: None   Past Medical History:  Diagnosis Date   Blood transfusion without reported diagnosis    [id number]   Depression    Diverticulitis    Diverticulosis    Ovarian cyst    Pelvic pain in female    Recurrent sinus infections    Scoliosis     Family History  Problem Relation Age of Onset   Asthma Mother    Hypertension Mother    Miscarriages / [place] Mother    Alcohol abuse Father    Drug abuse Father    Pancreatic cancer Father    Arthritis Maternal Grandmother    Colon cancer Neg Hx    Stomach cancer Neg Hx    Esophageal cancer Neg Hx    Rectal cancer Neg Hx    Liver cancer Neg Hx     Past Surgical History:  Procedure Laterality Date   NASAL SEPTUM SURGERY  11/2013   SPINE SURGERY  97,98,99,00   scoliosis    Social History   Socioeconomic History   Marital status: Divorced    Spouse name: Not on file   Number of children: 2   Years of education: Not on file  Highest education level: Not on file  Occupational History   Occupation: EC Teacher  Tobacco Use   Smoking status: Some Days   Smokeless tobacco: Never   Tobacco comments:    2-3 CIGARETTES EVERY TWO WEEKS  Vaping Use   Vaping Use: Never used  Substance and Sexual Activity   Alcohol use: Not Currently    Alcohol/week: 6.0 standard drinks    Types: 6 Cans of beer per week    Comment: SOCIALLY   Drug use: No   Sexual activity: Yes    Birth control/protection: Other-see comments, Inserts  Other Topics Concern   Not on file  Social History Narrative   Entered 06/2014:   Has 2 children--both are boys--one is 58 y/o. The other is "teenager"--not sure exact age.   At home --it is her and her 2 sons.   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   Social Determinants of Health   Financial Resource Strain: Not on file  Food Insecurity: Not on file  Transportation Needs: Not on file  Physical Activity: Not  on file  Stress: Not on file  Social Connections: Not on file  Intimate Partner Violence: Not on file    Current Outpatient Medications on File Prior to Visit  Medication Sig Dispense Refill   buPROPion (WELLBUTRIN XL) 150 MG 24 hr tablet Take 1 tablet (150 mg total) by mouth every morning. 30 tablet 1   labetalol (NORMODYNE) 100 MG tablet Take 1 tablet (100 mg total) by mouth 2 (two) times daily. 60 tablet 2   traZODone (DESYREL) 50 MG tablet Take 25 mg by mouth as needed for sleep.     No current facility-administered medications on file prior to visit.    Allergies  Allergen Reactions   Avelox [Moxifloxacin Hcl In Nacl] Swelling   Abilify [Aripiprazole] Swelling    Made jaw twitch     Review of Systems General: Not Present- Fever, Weight Loss and Weight Gain. Skin: Not Present- Rash. HEENT: Not Present- Blurred Vision, Headache and Bleeding Gums. Respiratory: Not Present- Difficulty Breathing. Breast: Not Present- Breast Mass. Cardiovascular: Not Present- Chest Pain, Elevated Blood Pressure, Fainting / Blacking Out and Shortness of Breath. Gastrointestinal: Not Present- Abdominal Pain, Constipation.  Positive for Nausea and Vomiting. Female Genitourinary: Not Present- Frequency, Painful Urination, Pelvic Pain, Vaginal Bleeding, Vaginal Discharge, Contractions, regular, Fetal Movements Decreased, Urinary Complaints and Vaginal Fluid. Musculoskeletal: Not Present- Back Pain and Leg Cramps. Neurological: Not Present- Dizziness. Psychiatric: Not Present- Depression. Positive for difficulty sleeping    Objective:   Blood pressure 124/88, pulse 72, weight 169 lb (76.7 kg), last menstrual period 11/08/2020, currently breastfeeding.  Body mass index is 31.93 kg/m.  General Appearance:    Alert, cooperative, no distress, appears stated age, mild obesity  Head:    Normocephalic, without obvious abnormality, atraumatic  Eyes:    PERRL, conjunctiva/corneas clear, EOM's intact, both  eyes  Ears:    Normal external ear canals, both ears  Nose:   Nares normal, septum midline, mucosa normal, no drainage or sinus tenderness  Throat:   Lips, mucosa, and tongue normal; teeth and gums normal  Neck:   Supple, symmetrical, trachea midline, no adenopathy; thyroid: no enlargement/tenderness/nodules; no carotid bruit or JVD  Back:     Symmetric, no curvature, ROM normal, no CVA tenderness  Lungs:     Clear to auscultation bilaterally, respirations unlabored  Chest Wall:    No tenderness or deformity   Heart:  Regular rate and rhythm, S1 and S2 normal, no murmur, rub or gallop  Breast Exam:    No tenderness, masses, or nipple abnormality  Abdomen:     Soft, non-tender, bowel sounds active all four quadrants, no masses, no organomegaly.  FHT 142 bpm.  Genitalia:    Physical not performed. Pregnancy positive findings: uterine enlargement: 33 wk size, nontender.   Rectal:    Not performed.   Extremities:   Extremities normal, atraumatic, no cyanosis or edema  Pulses:   2+ and symmetric all extremities  Skin:   Skin color, texture, turgor normal, no rashes or lesions  Lymph nodes:   Cervical, supraclavicular, and axillary nodes normal  Neurologic:   CNII-XII intact, normal strength, sensation and reflexes throughout    GAD 7 : Generalized Anxiety Score 08/21/2021 05/12/2019  Nervous, Anxious, on Edge 2 0  Control/stop worrying 1 -  Worry too much - different things 1 -  Trouble relaxing 1 -  Restless 0 -  Easily annoyed or irritable 2 -  Afraid - awful might happen 0 -  Total GAD 7 Score 7 -  Anxiety Difficulty Somewhat difficult -     Depression screen [facility] 2/9 08/21/2021 05/12/2019 02/17/2019 01/06/2019 07/14/2018  Decreased Interest 1 2 1 3 1   Down, Depressed, Hopeless 1 3 1 3 1   PHQ - 2 Score 2 5 2 6 2   Altered sleeping 1 3 0 3 0  Tired, decreased energy 1 3 2 3  0  Change in appetite 1 3 0 3 0  Feeling bad or failure about yourself  0 2 0 1 1  Trouble concentrating 0 0 0 1 1   Moving slowly or fidgety/restless 1 0 0 1 1  Suicidal thoughts 0 1 0 1 0  PHQ-9 Score 6 17 4 19 5   Difficult doing work/chores - Somewhat difficult Not difficult at all Somewhat difficult -      Assessment:   1. Supervision of high risk pregnancy in third trimester   2. Depression with anxiety   3. No prenatal care in current pregnancy in third trimester   4. Nausea and vomiting in pregnancy   5. Pre-existing hypertension during pregnancy in third trimester, unspecified pre-existing hypertension type   6. Need for Tdap vaccination   7. Sleep disturbances   8. 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 - Prenatal vitamins encouraged. - Problem list reviewed and updated. - New OB counseling:  The patient has been given an overview regarding routine prenatal care.  Recommendations regarding diet, weight gain, and exercise in pregnancy were given. - Prenatal testing, optional genetic testing, and ultrasound use in pregnancy were reviewed.  Traditional genetic screening vs cell-fee DNA genetic screening discussed, including risks and benefits. Testing ordered ([id number]).  - Benefits of Breast Feeding were discussed. The patient is encouraged to consider nursing her baby post partum. - Performing 1 hour glucola today.  - Blood consent signed today.    2. Depression with anxiety - Patient reports doing better now that things have settled at home.  Baseline GAD/PHQ-9 wnl today. Continue current medication management.   3. No prenatal care in current pregnancy in third trimester - Discussed importance of prenatal care. Patient reports social issues and denial of pregnancy as reasons. Reports that she got [facility] in her previous pregnancies.   4. Nausea and vomiting in pregnancy - Currently on Zofran  8 mg.  Discussed that when prescription is completed, can attempt to wean back down to 4 mg  prn.    5. Pre-existing hypertension during pregnancy in third trimester, unspecified pre-existing hypertension type - Started on Labetalol in November during triage visit.  Patient notes that it made her feel "weird".  She decreased dose from 100 to 50 mg BID and feels much better. BPs controlled on current dosing.  - Discussed need to begin antenatal testing. Will schedule weekly.  - To be scheduled for growth scan as soon as possible.   6. Need for Tdap vaccination - Tdap given today.   7. Sleep disturbances - Currently drinking warm milk. Also advised on Benadryl/Unisom.   8. 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 Will need to assess placenta after delivery.   Follow up in 4 weeks.    Rubie Maid, MD [facility]

## 2021-08-21 NOTE — Patient Instructions (Signed)

## 2021-08-22 LAB — CBC
Hematocrit: 33.4 % — ABNORMAL LOW (ref 34.0–46.6)
Hemoglobin: 11.3 g/dL (ref 11.1–15.9)
MCH: 31.8 pg (ref 26.6–33.0)
MCHC: 33.8 g/dL (ref 31.5–35.7)
MCV: 94 fL (ref 79–97)
Platelets: 344 10*3/uL (ref 150–450)
RBC: 3.55 x10E6/uL — ABNORMAL LOW (ref 3.77–5.28)
RDW: 12.3 % (ref 11.7–15.4)
WBC: 9.4 10*3/uL (ref 3.4–10.8)

## 2021-08-22 LAB — COMPREHENSIVE METABOLIC PANEL
ALT: 14 IU/L (ref 0–32)
AST: 20 IU/L (ref 0–40)
Albumin/Globulin Ratio: 1.5 (ref 1.2–2.2)
Albumin: 3.7 g/dL — ABNORMAL LOW (ref 3.8–4.8)
Alkaline Phosphatase: 121 IU/L (ref 44–121)
BUN/Creatinine Ratio: 8 — ABNORMAL LOW (ref 9–23)
BUN: 5 mg/dL — ABNORMAL LOW (ref 6–24)
Bilirubin Total: 0.3 mg/dL (ref 0.0–1.2)
CO2: 18 mmol/L — ABNORMAL LOW (ref 20–29)
Calcium: 8.4 mg/dL — ABNORMAL LOW (ref 8.7–10.2)
Chloride: 103 mmol/L (ref 96–106)
Creatinine, Ser: 0.64 mg/dL (ref 0.57–1.00)
Globulin, Total: 2.5 g/dL (ref 1.5–4.5)
Glucose: 134 mg/dL — ABNORMAL HIGH (ref 70–99)
Potassium: 3.9 mmol/L (ref 3.5–5.2)
Sodium: 137 mmol/L (ref 134–144)
Total Protein: 6.2 g/dL (ref 6.0–8.5)
eGFR: 114 mL/min/{1.73_m2} (ref >59)

## 2021-08-22 LAB — HEPATITIS C ANTIBODY: Hep C Virus Ab: <0.1 s/co ratio (ref 0.0–0.9)

## 2021-08-22 LAB — RPR: RPR Ser Ql: NONREACTIVE

## 2021-08-22 LAB — PROTEIN / CREATININE RATIO, URINE
Creatinine, Urine: 136.7 mg/dL
Protein, Ur: 22.9 mg/dL
Protein/Creat Ratio: 168 mg/g creat (ref 0–200)

## 2021-08-22 LAB — GLUCOSE, 1 HOUR GESTATIONAL: Gestational Diabetes Screen: 139 mg/dL (ref 70–139)

## 2021-08-23 ENCOUNTER — Other Ambulatory Visit: Payer: Self-pay | Admitting: Obstetrics and Gynecology

## 2021-08-23 ENCOUNTER — Other Ambulatory Visit: Payer: Self-pay

## 2021-08-23 ENCOUNTER — Ambulatory Visit (INDEPENDENT_AMBULATORY_CARE_PROVIDER_SITE_OTHER): Payer: BC Managed Care – PPO

## 2021-08-23 DIAGNOSIS — O0993 Supervision of high risk pregnancy, unspecified, third trimester: Secondary | ICD-10-CM

## 2021-08-23 LAB — DRUG PROFILE, UR, 9 DRUGS (LABCORP)
Amphetamines, Urine: NEGATIVE ng/mL
Barbiturate Quant, Ur: NEGATIVE ng/mL
Benzodiazepine Quant, Ur: NEGATIVE ng/mL
Cannabinoid Quant, Ur: NEGATIVE ng/mL
Cocaine (Metab.): NEGATIVE ng/mL
Methadone Screen, Urine: NEGATIVE ng/mL
Opiate Quant, Ur: NEGATIVE ng/mL
PCP Quant, Ur: NEGATIVE ng/mL
Propoxyphene: NEGATIVE ng/mL

## 2021-08-26 ENCOUNTER — Other Ambulatory Visit: Payer: Self-pay | Admitting: Obstetrics and Gynecology

## 2021-08-26 ENCOUNTER — Telehealth: Payer: Self-pay | Admitting: Obstetrics and Gynecology

## 2021-08-26 LAB — MATERNIT21  PLUS CORE+ESS+SCA, BLOOD
11q23 deletion (Jacobsen): NOT DETECTED
15q11 deletion (PW Angelman): NOT DETECTED
1p36 deletion syndrome: NOT DETECTED
22q11 deletion (DiGeorge): NOT DETECTED
4p16 deletion(Wolf-Hirschhorn): NOT DETECTED
5p15 deletion (Cri-du-chat): NOT DETECTED
8q24 deletion (Langer-Giedion): NOT DETECTED
Fetal Fraction: 29
Monosomy X (Turner Syndrome): NOT DETECTED
Result (T21): NEGATIVE
Trisomy 13 (Patau syndrome): NEGATIVE
Trisomy 16: NOT DETECTED
Trisomy 18 (Edwards syndrome): NEGATIVE
Trisomy 21 (Down syndrome): NEGATIVE
Trisomy 22: NOT DETECTED
XXX (Triple X Syndrome): NOT DETECTED
XXY (Klinefelter Syndrome): NOT DETECTED
XYY (Jacobs Syndrome): NOT DETECTED

## 2021-08-26 MED ORDER — ONDANSETRON 8 MG PO TBDP: 8.0000 mg | ORAL_TABLET | Freq: Three times a day (TID) | ORAL | 1 refills | Status: DC | PRN

## 2021-08-26 NOTE — Telephone Encounter (Signed)
Pt called stating that she had an RX sent in (Zofran) her insurance will only cover 18 pills out of 21 days- she states that she is currently out and is already throwing up and can not hold anything down without medication. Her pharmacy ([facility] [address]) advised her to have RX revised for 8 mg - so she can cut in half and insurance will cover this. Please Advsie\.

## 2021-08-26 NOTE — Telephone Encounter (Signed)
Well, I just discontinued her 8 mg since the refill request was sent in for 4 mg.  I will refill the 8 mg.

## 2021-08-28 ENCOUNTER — Other Ambulatory Visit: Payer: Self-pay

## 2021-08-28 ENCOUNTER — Ambulatory Visit (INDEPENDENT_AMBULATORY_CARE_PROVIDER_SITE_OTHER): Payer: BC Managed Care – PPO | Admitting: Obstetrics and Gynecology

## 2021-08-28 DIAGNOSIS — O10913 Unspecified pre-existing hypertension complicating pregnancy, third trimester: Secondary | ICD-10-CM | POA: Diagnosis not present

## 2021-08-28 DIAGNOSIS — O09523 Supervision of elderly multigravida, third trimester: Secondary | ICD-10-CM

## 2021-08-29 NOTE — Progress Notes (Signed)
NONSTRESS TEST INTERPRETATION  INDICATIONS: Chronic hypertension and  AMA  FHR baseline: 130 bpm RESULTS:Reactive COMMENTS: 1 contraction.     PLAN: 1. Continue fetal kick counts twice a day. 2. Continue antepartum testing as scheduled-weekly    Rubie Maid, MD [facility]

## 2021-09-05 ENCOUNTER — Ambulatory Visit (INDEPENDENT_AMBULATORY_CARE_PROVIDER_SITE_OTHER): Payer: BC Managed Care – PPO | Admitting: Obstetrics and Gynecology

## 2021-09-05 ENCOUNTER — Other Ambulatory Visit: Payer: Self-pay

## 2021-09-05 ENCOUNTER — Encounter: Payer: Self-pay | Admitting: Obstetrics and Gynecology

## 2021-09-05 ENCOUNTER — Other Ambulatory Visit: Payer: BC Managed Care – PPO

## 2021-09-05 VITALS — BP 140/88 | HR 91 | Wt 171.5 lb

## 2021-09-05 DIAGNOSIS — O10913 Unspecified pre-existing hypertension complicating pregnancy, third trimester: Secondary | ICD-10-CM

## 2021-09-05 DIAGNOSIS — O09523 Supervision of elderly multigravida, third trimester: Secondary | ICD-10-CM | POA: Diagnosis not present

## 2021-09-05 DIAGNOSIS — Z3A35 35 weeks gestation of pregnancy: Secondary | ICD-10-CM

## 2021-09-05 LAB — POCT URINALYSIS DIPSTICK OB
Bilirubin, UA: NEGATIVE
Blood, UA: NEGATIVE
Glucose, UA: NEGATIVE
Ketones, UA: NEGATIVE
Leukocytes, UA: NEGATIVE
Nitrite, UA: NEGATIVE
POC,PROTEIN,UA: NEGATIVE
Spec Grav, UA: 1.01 (ref 1.010–1.025)
Urobilinogen, UA: 0.2 E.U./dL
pH, UA: 6.5 (ref 5.0–8.0)

## 2021-09-05 NOTE — Progress Notes (Signed)
ROB: She is doing well, no new concerns. NST today.

## 2021-09-05 NOTE — Progress Notes (Signed)
ROB: Doing well.  Daily fetal movement.  Blood pressures well controlled.  Repeat = 126/83.  Patient taking half a pill twice daily.  Succenturiate placenta discussed.  Fetal growth noted at 40th percentile with most recent ultrasound.  Necessity for daily fetal movement/kick counts discussed.  Cultures next visit.

## 2021-09-11 ENCOUNTER — Telehealth: Payer: Self-pay

## 2021-09-11 NOTE — Telephone Encounter (Signed)
Which dosage is she speaking of, as I refilled both the 8 and the 4 mg dosing?  I would advise however that it is not considered safe to take the high doses of the Zofran more frequently or for an extended period of time during her pregnancy and would not recommend doing so.

## 2021-09-12 MED ORDER — ONDANSETRON 4 MG PO TBDP: 4.0000 mg | ORAL_TABLET | Freq: Four times a day (QID) | ORAL | 1 refills | Status: DC | PRN

## 2021-09-12 NOTE — Telephone Encounter (Signed)
New prescription for the 4 mg dosing sent in.   Dr. Marcelline Mates

## 2021-09-12 NOTE — Telephone Encounter (Signed)
Patient called. The insurance will only cover every 21 days. If you can switch the prescription to once every 6 or 7 hours. Insurance will only cover 18 tablets. She hasn't been able to keep food down. She is not taking more than she has too, it's only because of the insurance. Please advise.

## 2021-09-12 NOTE — Telephone Encounter (Signed)
Sent message to patient via [facility].

## 2021-09-13 ENCOUNTER — Other Ambulatory Visit: Payer: BC Managed Care – PPO

## 2021-09-13 ENCOUNTER — Encounter: Payer: Self-pay | Admitting: Obstetrics and Gynecology

## 2021-09-13 ENCOUNTER — Ambulatory Visit (INDEPENDENT_AMBULATORY_CARE_PROVIDER_SITE_OTHER): Payer: BC Managed Care – PPO | Admitting: Obstetrics and Gynecology

## 2021-09-13 ENCOUNTER — Other Ambulatory Visit: Payer: Self-pay

## 2021-09-13 VITALS — BP 127/87 | HR 78 | Wt 173.0 lb

## 2021-09-13 DIAGNOSIS — O09523 Supervision of elderly multigravida, third trimester: Secondary | ICD-10-CM | POA: Diagnosis not present

## 2021-09-13 DIAGNOSIS — O10013 Pre-existing essential hypertension complicating pregnancy, third trimester: Secondary | ICD-10-CM

## 2021-09-13 DIAGNOSIS — Z113 Encounter for screening for infections with a predominantly sexual mode of transmission: Secondary | ICD-10-CM

## 2021-09-13 DIAGNOSIS — Z3A36 36 weeks gestation of pregnancy: Secondary | ICD-10-CM

## 2021-09-13 DIAGNOSIS — Z3685 Encounter for antenatal screening for Streptococcus B: Secondary | ICD-10-CM

## 2021-09-13 DIAGNOSIS — O10913 Unspecified pre-existing hypertension complicating pregnancy, third trimester: Secondary | ICD-10-CM

## 2021-09-13 LAB — POCT URINALYSIS DIPSTICK OB
Bilirubin, UA: NEGATIVE
Blood, UA: NEGATIVE
Glucose, UA: NEGATIVE
Ketones, UA: NEGATIVE
Leukocytes, UA: NEGATIVE
Nitrite, UA: NEGATIVE
POC,PROTEIN,UA: NEGATIVE
Spec Grav, UA: 1.015 (ref 1.010–1.025)
Urobilinogen, UA: NEGATIVE E.U./dL — AB
pH, UA: 7.5 (ref 5.0–8.0)

## 2021-09-13 NOTE — Progress Notes (Signed)
ROB: Denies major complaints.  Discussed contraception again, patient most certainly now desires BTL.  36 week cultures done. NST performed today was reviewed and was found to be reactive.  Continue recommended antenatal testing and prenatal care. RTC in 1 week. To begin discussions of IOL between 38-39 weeks for cHTN.    NONSTRESS TEST INTERPRETATION  INDICATIONS: Chronic hypertension and  AMA  FHR baseline: 140 bpm RESULTS:Reactive COMMENTS: No contractions, occasional irritability   PLAN: 1. Continue fetal kick counts twice a day. 2. Continue antepartum testing as scheduled-weekly

## 2021-09-13 NOTE — Progress Notes (Signed)
ROB: Patient has been noting more nausea and vomiting over the past few days as she ran out of her prescription of Zofran, but recently got prescription filled. Otherwise no issues. For 36 week cultures today.

## 2021-09-15 LAB — STREP GP B NAA: Strep Gp B NAA: NEGATIVE

## 2021-09-15 LAB — GC/CHLAMYDIA PROBE AMP
Chlamydia trachomatis, NAA: NEGATIVE
Neisseria Gonorrhoeae by PCR: NEGATIVE

## 2021-09-16 ENCOUNTER — Ambulatory Visit (INDEPENDENT_AMBULATORY_CARE_PROVIDER_SITE_OTHER): Payer: BC Managed Care – PPO

## 2021-09-16 ENCOUNTER — Other Ambulatory Visit: Payer: Self-pay

## 2021-09-16 DIAGNOSIS — O09523 Supervision of elderly multigravida, third trimester: Secondary | ICD-10-CM

## 2021-09-16 DIAGNOSIS — O10913 Unspecified pre-existing hypertension complicating pregnancy, third trimester: Secondary | ICD-10-CM

## 2021-09-18 ENCOUNTER — Telehealth: Payer: Self-pay | Admitting: Obstetrics and Gynecology

## 2021-09-18 NOTE — Telephone Encounter (Signed)
Patient called and stated that she has been having a lot of pressure and that her mucus plug is leaking.

## 2021-09-20 ENCOUNTER — Other Ambulatory Visit: Payer: Self-pay

## 2021-09-20 ENCOUNTER — Ambulatory Visit (INDEPENDENT_AMBULATORY_CARE_PROVIDER_SITE_OTHER): Payer: BC Managed Care – PPO | Admitting: Obstetrics and Gynecology

## 2021-09-20 ENCOUNTER — Encounter: Payer: Self-pay | Admitting: Obstetrics and Gynecology

## 2021-09-20 VITALS — BP 139/91 | HR 75 | Wt 177.3 lb

## 2021-09-20 DIAGNOSIS — Z3A37 37 weeks gestation of pregnancy: Secondary | ICD-10-CM

## 2021-09-20 DIAGNOSIS — O09523 Supervision of elderly multigravida, third trimester: Secondary | ICD-10-CM

## 2021-09-20 LAB — POCT URINALYSIS DIPSTICK OB
Bilirubin, UA: NEGATIVE
Blood, UA: NEGATIVE
Glucose, UA: NEGATIVE
Ketones, UA: NEGATIVE
Leukocytes, UA: NEGATIVE
Nitrite, UA: NEGATIVE
POC,PROTEIN,UA: NEGATIVE
Spec Grav, UA: 1.01 (ref 1.010–1.025)
Urobilinogen, UA: 0.2 E.U./dL
pH, UA: 7 (ref 5.0–8.0)

## 2021-09-20 NOTE — Progress Notes (Signed)
ROB: She is uncomfortable with sleeping, "swelling", and she believes she lost her mucous plug on Monday.  She denies significant contractions.  We have again discussed induction in detail and the patient would like to put off induction until closer to 39 weeks.  Given the choices she prefers Friday, February 10.  Induction scheduled.  COVID testing with next visit.

## 2021-09-20 NOTE — Progress Notes (Signed)
ROB. Patient states fetal movement with pelvis pressure, also states hands have been slightly swollen. Patient states brownish/red discharge on 09/17/21 and believes it was her mucous plug, since then white discharge only. Patient states no concerns at this time.

## 2021-09-22 ENCOUNTER — Other Ambulatory Visit: Payer: Self-pay | Admitting: Obstetrics and Gynecology

## 2021-09-23 ENCOUNTER — Other Ambulatory Visit: Payer: Self-pay

## 2021-09-24 ENCOUNTER — Ambulatory Visit (INDEPENDENT_AMBULATORY_CARE_PROVIDER_SITE_OTHER): Payer: BC Managed Care – PPO | Admitting: Obstetrics and Gynecology

## 2021-09-24 ENCOUNTER — Other Ambulatory Visit: Payer: BC Managed Care – PPO

## 2021-09-24 ENCOUNTER — Other Ambulatory Visit: Payer: Self-pay

## 2021-09-24 ENCOUNTER — Other Ambulatory Visit
Admission: RE | Admit: 2021-09-24 | Discharge: 2021-09-24 | Disposition: A | Discharge status: Home / Self Care | Payer: BC Managed Care – PPO | Source: Ambulatory Visit | Attending: Obstetrics and Gynecology | Admitting: Obstetrics and Gynecology

## 2021-09-24 ENCOUNTER — Other Ambulatory Visit: Payer: Self-pay | Admitting: Obstetrics and Gynecology

## 2021-09-24 ENCOUNTER — Encounter: Payer: Self-pay | Admitting: Obstetrics and Gynecology

## 2021-09-24 VITALS — BP 136/90 | HR 93 | Wt 174.9 lb

## 2021-09-24 DIAGNOSIS — O09523 Supervision of elderly multigravida, third trimester: Secondary | ICD-10-CM | POA: Diagnosis not present

## 2021-09-24 DIAGNOSIS — Z3A38 38 weeks gestation of pregnancy: Secondary | ICD-10-CM | POA: Diagnosis not present

## 2021-09-24 DIAGNOSIS — Z01812 Encounter for preprocedural laboratory examination: Secondary | ICD-10-CM

## 2021-09-24 DIAGNOSIS — Z20822 Contact with and (suspected) exposure to covid-19: Secondary | ICD-10-CM | POA: Insufficient documentation

## 2021-09-24 LAB — POCT URINALYSIS DIPSTICK OB
Bilirubin, UA: NEGATIVE
Blood, UA: NEGATIVE
Glucose, UA: NEGATIVE
Ketones, UA: NEGATIVE
Nitrite, UA: NEGATIVE
POC,PROTEIN,UA: NEGATIVE
Spec Grav, UA: 1.01 (ref 1.010–1.025)
Urobilinogen, UA: 0.2 E.U./dL
pH, UA: 6.5 (ref 5.0–8.0)

## 2021-09-24 NOTE — Progress Notes (Signed)
ROB: She denies problems.  States that she is not really having hard contractions.  Reports constant fetal movement.  Kick counts discussed.  Patient does not have time for NST today.  With planned induction for Friday, she will monitor daily fetal movement and any decrease she is to call us.  Otherwise plan for induction Friday as scheduled.

## 2021-09-24 NOTE — Progress Notes (Signed)
ROB: She lost her mucous plug. She has no new concerns. NST not done, she had to be back in school.

## 2021-09-25 LAB — SARS CORONAVIRUS 2 (TAT 6-24 HRS): SARS Coronavirus 2: NEGATIVE

## 2021-09-27 ENCOUNTER — Inpatient Hospital Stay: Payer: BC Managed Care – PPO | Admitting: Anesthesiology

## 2021-09-27 ENCOUNTER — Encounter: Payer: Self-pay | Admitting: Anesthesiology

## 2021-09-27 ENCOUNTER — Inpatient Hospital Stay
Admission: EM | Admit: 2021-09-27 | Discharge: 2021-09-29 | DRG: 807 | Disposition: A | Discharge status: Home / Self Care | Payer: BC Managed Care – PPO | Attending: Obstetrics and Gynecology | Admitting: Obstetrics and Gynecology

## 2021-09-27 ENCOUNTER — Other Ambulatory Visit: Payer: Self-pay

## 2021-09-27 ENCOUNTER — Encounter: Payer: Self-pay | Admitting: Obstetrics and Gynecology

## 2021-09-27 DIAGNOSIS — O169 Unspecified maternal hypertension, unspecified trimester: Secondary | ICD-10-CM | POA: Diagnosis present

## 2021-09-27 DIAGNOSIS — I10 Essential (primary) hypertension: Secondary | ICD-10-CM

## 2021-09-27 DIAGNOSIS — F419 Anxiety disorder, unspecified: Secondary | ICD-10-CM | POA: Diagnosis present

## 2021-09-27 DIAGNOSIS — O99344 Other mental disorders complicating childbirth: Secondary | ICD-10-CM | POA: Diagnosis present

## 2021-09-27 DIAGNOSIS — F32A Depression, unspecified: Secondary | ICD-10-CM | POA: Diagnosis present

## 2021-09-27 DIAGNOSIS — O09523 Supervision of elderly multigravida, third trimester: Secondary | ICD-10-CM

## 2021-09-27 DIAGNOSIS — Z20822 Contact with and (suspected) exposure to covid-19: Secondary | ICD-10-CM | POA: Diagnosis present

## 2021-09-27 DIAGNOSIS — O0933 Supervision of pregnancy with insufficient antenatal care, third trimester: Secondary | ICD-10-CM

## 2021-09-27 DIAGNOSIS — O43193 Other malformation of placenta, third trimester: Secondary | ICD-10-CM

## 2021-09-27 DIAGNOSIS — Z79899 Other long term (current) drug therapy: Secondary | ICD-10-CM | POA: Diagnosis not present

## 2021-09-27 DIAGNOSIS — O1002 Pre-existing essential hypertension complicating childbirth: Principal | ICD-10-CM | POA: Diagnosis present

## 2021-09-27 DIAGNOSIS — F418 Other specified anxiety disorders: Secondary | ICD-10-CM | POA: Diagnosis not present

## 2021-09-27 DIAGNOSIS — O10013 Pre-existing essential hypertension complicating pregnancy, third trimester: Secondary | ICD-10-CM

## 2021-09-27 DIAGNOSIS — Z3A38 38 weeks gestation of pregnancy: Secondary | ICD-10-CM

## 2021-09-27 DIAGNOSIS — O163 Unspecified maternal hypertension, third trimester: Secondary | ICD-10-CM

## 2021-09-27 DIAGNOSIS — Z87891 Personal history of nicotine dependence: Secondary | ICD-10-CM

## 2021-09-27 DIAGNOSIS — O219 Vomiting of pregnancy, unspecified: Secondary | ICD-10-CM

## 2021-09-27 DIAGNOSIS — O43199 Other malformation of placenta, unspecified trimester: Secondary | ICD-10-CM | POA: Diagnosis present

## 2021-09-27 DIAGNOSIS — O134 Gestational [pregnancy-induced] hypertension without significant proteinuria, complicating childbirth: Secondary | ICD-10-CM

## 2021-09-27 LAB — HEPATITIS B SURFACE ANTIGEN: Hepatitis B Surface Ag: NONREACTIVE

## 2021-09-27 LAB — CBC
HCT: 32.6 % — ABNORMAL LOW (ref 36.0–46.0)
Hemoglobin: 11 g/dL — ABNORMAL LOW (ref 12.0–15.0)
MCH: 30.7 pg (ref 26.0–34.0)
MCHC: 33.7 g/dL (ref 30.0–36.0)
MCV: 91.1 fL (ref 80.0–100.0)
Platelets: 335 10*3/uL (ref 150–400)
RBC: 3.58 MIL/uL — ABNORMAL LOW (ref 3.87–5.11)
RDW: 14.7 % (ref 11.5–15.5)
WBC: 11.1 10*3/uL — ABNORMAL HIGH (ref 4.0–10.5)
nRBC: 0 % (ref 0.0–0.2)

## 2021-09-27 LAB — RAPID HIV SCREEN (HIV 1/2 AB+AG)
HIV 1/2 Antibodies: NONREACTIVE
HIV-1 P24 Antigen - HIV24: NONREACTIVE

## 2021-09-27 LAB — TYPE AND SCREEN
ABO/RH(D): A POS
Antibody Screen: NEGATIVE

## 2021-09-27 LAB — HEPATITIS C ANTIBODY: HCV Ab: NONREACTIVE

## 2021-09-27 LAB — RPR: RPR Ser Ql: NONREACTIVE

## 2021-09-27 MED ORDER — MISOPROSTOL 25 MCG QUARTER TABLET
50.0000 ug | ORAL_TABLET | ORAL | Status: DC | PRN
  Administered 2021-09-27 (×2): 50 ug via VAGINAL
  Filled 2021-09-27 (×2): qty 1
  Filled 2021-09-27: qty 2

## 2021-09-27 MED ORDER — SIMETHICONE 80 MG PO CHEW: 80.0000 mg | CHEWABLE_TABLET | ORAL | Status: DC | PRN

## 2021-09-27 MED ORDER — FAMOTIDINE 20 MG PO TABS
40.0000 mg | ORAL_TABLET | Freq: Once | ORAL | Status: DC
  Filled 2021-09-27: qty 2

## 2021-09-27 MED ORDER — ONDANSETRON HCL 4 MG/2ML IJ SOLN: 4.0000 mg | INTRAMUSCULAR | Status: DC | PRN

## 2021-09-27 MED ORDER — LIDOCAINE-EPINEPHRINE (PF) 1.5 %-1:200000 IJ SOLN
INTRAMUSCULAR | Status: DC | PRN
Start: 2021-09-27 — End: 2021-09-27
  Administered 2021-09-27: 3 mL via EPIDURAL

## 2021-09-27 MED ORDER — FENTANYL-BUPIVACAINE-NACL 0.5-0.125-0.9 MG/250ML-% EP SOLN
12.0000 mL/h | EPIDURAL | Status: DC | PRN
  Administered 2021-09-27: 12 mL/h via EPIDURAL

## 2021-09-27 MED ORDER — EPHEDRINE 5 MG/ML INJ
10.0000 mg | INTRAVENOUS | Status: DC | PRN
  Filled 2021-09-27: qty 2

## 2021-09-27 MED ORDER — OXYTOCIN-SODIUM CHLORIDE 30-0.9 UT/500ML-% IV SOLN
2.5000 [IU]/h | INTRAVENOUS | Status: DC
  Filled 2021-09-27: qty 1000

## 2021-09-27 MED ORDER — BENZOCAINE-MENTHOL 20-0.5 % EX AERO: 1.0000 "application " | INHALATION_SPRAY | CUTANEOUS | Status: DC | PRN

## 2021-09-27 MED ORDER — DIPHENHYDRAMINE HCL 25 MG PO CAPS: 25.0000 mg | ORAL_CAPSULE | Freq: Four times a day (QID) | ORAL | Status: DC | PRN

## 2021-09-27 MED ORDER — LACTATED RINGERS IV SOLN
500.0000 mL | INTRAVENOUS | Status: DC | PRN
  Administered 2021-09-27: 1000 mL via INTRAVENOUS

## 2021-09-27 MED ORDER — COCONUT OIL OIL
1.0000 "application " | TOPICAL_OIL | Status: DC | PRN
  Filled 2021-09-27: qty 120

## 2021-09-27 MED ORDER — METOCLOPRAMIDE HCL 10 MG PO TABS
10.0000 mg | ORAL_TABLET | Freq: Once | ORAL | Status: DC
  Filled 2021-09-27: qty 1

## 2021-09-27 MED ORDER — LACTATED RINGERS IV SOLN: INTRAVENOUS | Status: DC

## 2021-09-27 MED ORDER — AMMONIA AROMATIC IN INHA
RESPIRATORY_TRACT | Status: AC
  Filled 2021-09-27: qty 10

## 2021-09-27 MED ORDER — DIBUCAINE (PERIANAL) 1 % EX OINT: 1.0000 "application " | TOPICAL_OINTMENT | CUTANEOUS | Status: DC | PRN

## 2021-09-27 MED ORDER — LACTATED RINGERS IV SOLN
500.0000 mL | Freq: Once | INTRAVENOUS | Status: AC
  Administered 2021-09-27: 500 mL via INTRAVENOUS

## 2021-09-27 MED ORDER — ACETAMINOPHEN 325 MG PO TABS
650.0000 mg | ORAL_TABLET | ORAL | Status: DC | PRN
  Administered 2021-09-28: 650 mg via ORAL
  Filled 2021-09-27: qty 2

## 2021-09-27 MED ORDER — BUPIVACAINE HCL (PF) 0.25 % IJ SOLN
INTRAMUSCULAR | Status: DC | PRN
  Administered 2021-09-27 (×2): 4 mL via EPIDURAL

## 2021-09-27 MED ORDER — ZOLPIDEM TARTRATE 5 MG PO TABS: 5.0000 mg | ORAL_TABLET | Freq: Every evening | ORAL | Status: DC | PRN

## 2021-09-27 MED ORDER — PRENATAL MULTIVITAMIN CH
1.0000 | ORAL_TABLET | Freq: Every day | ORAL | Status: DC
  Administered 2021-09-28 – 2021-09-29 (×2): 1 via ORAL
  Filled 2021-09-27 (×2): qty 1

## 2021-09-27 MED ORDER — DIPHENHYDRAMINE HCL 50 MG/ML IJ SOLN: 12.5000 mg | INTRAMUSCULAR | Status: DC | PRN

## 2021-09-27 MED ORDER — WITCH HAZEL-GLYCERIN EX PADS: 1.0000 "application " | MEDICATED_PAD | CUTANEOUS | Status: DC | PRN

## 2021-09-27 MED ORDER — SODIUM CHLORIDE 0.9 % IV SOLN: 1.0000 g | INTRAVENOUS | Status: DC

## 2021-09-27 MED ORDER — BUTORPHANOL TARTRATE 1 MG/ML IJ SOLN: 1.0000 mg | INTRAMUSCULAR | Status: DC | PRN

## 2021-09-27 MED ORDER — LIDOCAINE HCL (PF) 1 % IJ SOLN
INTRAMUSCULAR | Status: AC
  Filled 2021-09-27: qty 30

## 2021-09-27 MED ORDER — LIDOCAINE HCL (PF) 1 % IJ SOLN
INTRAMUSCULAR | Status: DC | PRN
Start: 2021-09-27 — End: 2021-09-27
  Administered 2021-09-27: 2 mL via SUBCUTANEOUS

## 2021-09-27 MED ORDER — OXYTOCIN BOLUS FROM INFUSION
333.0000 mL | Freq: Once | INTRAVENOUS | Status: AC
  Administered 2021-09-27: 333 mL via INTRAVENOUS

## 2021-09-27 MED ORDER — PHENYLEPHRINE 40 MCG/ML (10ML) SYRINGE FOR IV PUSH (FOR BLOOD PRESSURE SUPPORT)
80.0000 ug | PREFILLED_SYRINGE | INTRAVENOUS | Status: DC | PRN
  Filled 2021-09-27: qty 10

## 2021-09-27 MED ORDER — SODIUM CHLORIDE 0.9 % IV SOLN
2.0000 g | Freq: Once | INTRAVENOUS | Status: DC
  Filled 2021-09-27: qty 2000

## 2021-09-27 MED ORDER — MISOPROSTOL 200 MCG PO TABS
ORAL_TABLET | ORAL | Status: AC
  Filled 2021-09-27: qty 4

## 2021-09-27 MED ORDER — LIDOCAINE HCL (PF) 1 % IJ SOLN
30.0000 mL | INTRAMUSCULAR | Status: DC | PRN
  Filled 2021-09-27: qty 30

## 2021-09-27 MED ORDER — SENNOSIDES-DOCUSATE SODIUM 8.6-50 MG PO TABS
2.0000 | ORAL_TABLET | Freq: Every day | ORAL | Status: DC
  Administered 2021-09-28 – 2021-09-29 (×2): 2 via ORAL
  Filled 2021-09-27 (×2): qty 2

## 2021-09-27 MED ORDER — OXYTOCIN 10 UNIT/ML IJ SOLN
INTRAMUSCULAR | Status: AC
  Filled 2021-09-27: qty 2

## 2021-09-27 MED ORDER — SOD CITRATE-CITRIC ACID 500-334 MG/5ML PO SOLN
30.0000 mL | ORAL | Status: DC | PRN
  Administered 2021-09-27: 30 mL via ORAL

## 2021-09-27 MED ORDER — ONDANSETRON HCL 4 MG PO TABS: 4.0000 mg | ORAL_TABLET | ORAL | Status: DC | PRN

## 2021-09-27 MED ORDER — IBUPROFEN 600 MG PO TABS
600.0000 mg | ORAL_TABLET | Freq: Four times a day (QID) | ORAL | Status: DC
  Administered 2021-09-27 – 2021-09-29 (×8): 600 mg via ORAL
  Filled 2021-09-27 (×8): qty 1

## 2021-09-27 MED ORDER — FENTANYL-BUPIVACAINE-NACL 0.5-0.125-0.9 MG/250ML-% EP SOLN
EPIDURAL | Status: AC
  Filled 2021-09-27: qty 250

## 2021-09-27 MED ORDER — ACETAMINOPHEN 325 MG PO TABS: 650.0000 mg | ORAL_TABLET | ORAL | Status: DC | PRN

## 2021-09-27 MED ORDER — ONDANSETRON HCL 4 MG/2ML IJ SOLN: 4.0000 mg | Freq: Four times a day (QID) | INTRAMUSCULAR | Status: DC | PRN

## 2021-09-27 NOTE — Anesthesia Preprocedure Evaluation (Addendum)
Anesthesia Evaluation  Patient identified by MRN, date of birth, ID band Patient awake    Reviewed: Allergy & Precautions, NPO status , Patient's Chart, lab work & pertinent test results  Airway Mallampati: III  TM Distance: >3 FB Neck ROM: full    Dental  (+) Chipped   Pulmonary neg pulmonary ROS, former smoker,    Pulmonary exam normal        Cardiovascular Exercise Tolerance: Good hypertension, negative cardio ROS Normal cardiovascular exam     Neuro/Psych Bipolar Disorder    GI/Hepatic negative GI ROS,   Endo/Other    Renal/GU   negative genitourinary   Musculoskeletal   Abdominal   Peds  Hematology negative hematology ROS (+)   Anesthesia Other Findings 41 y.o. [id number] with IUP at [redacted]w[redacted]d presenting for induction of labor for chronic HTN.  Past Medical History: No date: Blood transfusion without reported diagnosis     Comment:  [id number] No date: Depression No date: Diverticulitis No date: Diverticulosis No date: Ovarian cyst No date: Pelvic pain in female No date: Recurrent sinus infections No date: Scoliosis  Past Surgical History: 11/2013: NASAL SEPTUM SURGERY 97,98,99,00: SPINE SURGERY     Comment:  scoliosis  BMI    Body Mass Index: 33.12 kg/m      Reproductive/Obstetrics (+) Pregnancy                             Anesthesia Physical Anesthesia Plan  ASA: 2  Anesthesia Plan: Epidural   Post-op Pain Management:    Induction:   PONV Risk Score and Plan:   Airway Management Planned:   Additional Equipment:   Intra-op Plan:   Post-operative Plan:   Informed Consent:   Plan Discussed with: Anesthesiologist and CRNA  Anesthesia Plan Comments:        Anesthesia Quick Evaluation

## 2021-09-27 NOTE — Progress Notes (Signed)
Intrapartum Progress Note  S: Patient now feeling her contractions, moderately painful.   O: Blood pressure 139/84, pulse 72, temperature 98.4 F (36.9 C), temperature source Oral, resp. rate 16, height 5\' 1"  (1.549 m), weight 79.5 kg, last menstrual period 11/08/2020, currently breastfeeding. Gen App: NAD, uncomfortable with contractions Abdomen: soft, gravid FHT: baseline 140 bpm.  Accels present.  Decels absent. moderate in degree variability.   Tocometer: contractions q 1.5-3 minutes Cervix: per nurse exam at ~ 10:00, 1/60-70/-2 Extremities: Nontender, no edema.  Pitocin: None  Labs:  Results for orders placed or performed during the hospital encounter of 09/27/21  RPR  Result Value Ref Range   RPR Ser Ql NON REACTIVE NON REACTIVE  Rapid HIV screen [facility] L&D dept ONLY)  Result Value Ref Range   HIV-1 P24 Antigen - HIV24 NON REACTIVE NON REACTIVE   HIV 1/2 Antibodies NON REACTIVE NON REACTIVE   Interpretation (HIV Ag Ab)      A non reactive test result means that HIV 1 or HIV 2 antibodies and HIV 1 p24 antigen were not detected in the specimen.     Assessment:  1: SIUP at [redacted]w[redacted]d 2. cHTN in pregnancy 3. Advanced maternal age 41. Late prenatal care 5. Succenturiate placenta  Plan:  1. Continue IOL.  Is s/p second dose of Cytotec at ~ 10 AM. Will be due for next check in ~ 30-45 min. Analgesia as needed. Desires epidural.  2.  cHTN, well controlled on Labetalol 3. Will assess placenta post-delivery.    Rubie Maid, MD 09/27/2021 2:02 PM

## 2021-09-27 NOTE — Anesthesia Procedure Notes (Addendum)
Epidural Patient location during procedure: OB Start time: 09/27/2021 1:59 PM End time: 09/27/2021 2:02 PM  Staffing Anesthesiologist: Iran Ouch, MD Resident/CRNA: Hedda Slade, CRNA Performed: resident/CRNA   Preanesthetic Checklist Completed: patient identified, IV checked, site marked, risks and benefits discussed, surgical consent, monitors and equipment checked, pre-op evaluation and timeout performed  Epidural Patient position: sitting Prep: ChloraPrep Patient monitoring: heart rate, continuous pulse ox and blood pressure Approach: midline Location: L3-L4 Injection technique: LOR air  Needle:  Needle type: Tuohy  Needle gauge: 18 G Needle length: 9 cm and 9 Needle insertion depth: 7 cm Catheter type: closed end flexible Catheter size: 20 Guage Catheter at skin depth: 12 cm Test dose: negative and 1.5% lidocaine with Epi 1:200 K  Assessment Sensory level: T10 Events: blood not aspirated, injection not painful, no injection resistance, no paresthesia and negative IV test  Additional Notes 1 attempt Pt. Evaluated and documentation done after procedure finished. Patient identified. Risks/Benefits/Options discussed with patient including but not limited to bleeding, infection, nerve damage, paralysis, failed block, incomplete pain control, headache, blood pressure changes, nausea, vomiting, reactions to medication both or allergic, itching and postpartum back pain. Confirmed with bedside nurse the patient's most recent platelet count. Confirmed with patient that they are not currently taking any anticoagulation, have any bleeding history or any family history of bleeding disorders. Patient expressed understanding and wished to proceed. All questions were answered. Sterile technique was used throughout the entire procedure. Please see nursing notes for vital signs. Test dose was given through epidural catheter and negative prior to continuing to dose epidural or start  infusion. Warning signs of high block given to the patient including shortness of breath, tingling/numbness in hands, complete motor block, or any concerning symptoms with instructions to call for help. Patient was given instructions on fall risk and not to get out of bed. All questions and concerns addressed with instructions to call with any issues or inadequate analgesia.    Patient tolerated the insertion well without immediate complications.Reason for block:procedure for pain

## 2021-09-27 NOTE — H&P (Signed)
Obstetric History and Physical  Krystal Blackwell is a 41 y.o. [id number] with IUP at [redacted]w[redacted]d presenting for induction of labor for chronic HTN in pregnancy. Patient states she has been having  none contractions, none vaginal bleeding, intact membranes, with active fetal movement.    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   Pre-existing hypertension during pregnancy in third trimester 08/21/2021   Supervision of high risk elderly multigravida in third trimester 06/28/2021   Late prenatal care affecting pregnancy in third trimester    Influenza    Nausea and vomiting in pregnancy    Sepsis after obstetrical procedure 10/11/2020   Sepsis ([place]) 10/10/2020   Bipolar 2 disorder ([place]) 11/02/2019   Diverticulitis of colon 06/16/2016   Recurrent rhinosinusitis 01/23/2016   Vitamin D deficiency 06/19/2014   Depression with anxiety    She plans to breastfeed She desires bilateral tubal ligation for postpartum contraception.   Prenatal labs and studies: ABO, Rh: --/--/A POS (02/10 0530) Antibody: NEG (02/10 0530) Rubella:   RPR: Non Reactive (01/04 1141)  HBsAg:    HIV:    ZHY:QMVHQION/-- (01/27 1626) 1 hr Glucola  normal  Genetic screening normal Anatomy US normal   Past Medical History:  Diagnosis Date   Blood transfusion without reported diagnosis    [id number]   Depression    Diverticulitis    Diverticulosis    Ovarian cyst    Pelvic pain in female    Recurrent sinus infections    Scoliosis     Past Surgical History:  Procedure Laterality Date   NASAL SEPTUM SURGERY  11/2013   SPINE SURGERY  97,98,99,00   scoliosis    OB History  Gravida Para Term Preterm AB Living  5 3 3   1 3   SAB IAB Ectopic Multiple Live Births        0 3    # Outcome Date GA Lbr  Len/2nd Weight Sex Delivery Anes PTL Lv  5 Current           4 AB 08/01/20          3 Term 05/29/18 [redacted]w[redacted]d / 00:28 3590 g F Vag-Spont EPI  LIV  2 Term 12/22/06 [redacted]w[redacted]d  2438 g M Vag-Spont   LIV  1 Term 12/21/00 [redacted]w[redacted]d  3090 g M Vag-Spont   LIV    Obstetric Comments  Please do not discuss previous pregnancy/abortion with other people in room per pt.     Social History   Socioeconomic History   Marital status: Divorced    Spouse name: Not on file   Number of children: 2   Years of education: Not on file   Highest education level: Not on file  Occupational History   Occupation: EC Teacher  Tobacco Use   Smoking status: Former    Types: Cigarettes    Quit date: 2022    Years since quitting: 1.1   Smokeless tobacco: Never   Tobacco comments:    2-3 CIGARETTES EVERY TWO WEEKS  Vaping Use   Vaping Use: Never used  Substance and Sexual Activity   Alcohol use: Not Currently    Alcohol/week: 6.0 standard drinks    Types: 6 Cans of beer per week   Drug use: No   Sexual activity: Not Currently    Birth control/protection: None  Other Topics Concern   Not on file  Social History Narrative   Entered 06/2014:   Has 2 children--both are boys--one is 72 y/o. The other is "teenager"--not sure exact age.   At home --it is her and her 2 sons.   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   Social Determinants of Health   Financial Resource Strain: Not on file  Food Insecurity: Not on file  Transportation Needs: Not on file  Physical Activity: Not on file  Stress: Not on file  Social Connections: Not on file    Family History  Problem Relation Age of Onset   Asthma Mother    Hypertension Mother    Miscarriages / [place] Mother    Alcohol abuse Father    Drug abuse Father    Pancreatic cancer Father    Arthritis Maternal Grandmother    Colon cancer Neg Hx    Stomach cancer Neg Hx    Esophageal cancer Neg Hx    Rectal cancer Neg Hx    Liver cancer  Neg Hx     Medications Prior to Admission  Medication Sig Dispense Refill Last Dose   labetalol (NORMODYNE) 100 MG tablet TAKE 1 TABLET BY MOUTH TWICE A DAY 180 tablet 0 Past Week   ondansetron (ZOFRAN-ODT) 4 MG disintegrating tablet Take 1 tablet (4 mg total) by mouth every 6 (six) hours as needed for nausea or vomiting. 40 tablet 1 Past Week   ondansetron (ZOFRAN-ODT) 8 MG disintegrating tablet Take 1 tablet (8 mg total) by mouth every 8 (eight) hours as needed for nausea or vomiting. 18 tablet 1 Past Week    Allergies  Allergen Reactions   Avelox [Moxifloxacin Hcl In Nacl] Swelling   Abilify [Aripiprazole] Swelling    Made jaw twitch    Review of Systems: Negative except for what is mentioned in HPI.  Physical Exam: BP 136/84 (BP Location: Left Arm)    Pulse 76    Temp 98.1 F (36.7 C) (Oral)    Resp 14    Ht 5\' 1"  (1.549 m)    Wt 79.5 kg    LMP 11/08/2020    BMI 33.12 kg/m  CONSTITUTIONAL: Well-developed, well-nourished female in no acute distress.  HENT:  Normocephalic, atraumatic, External right and left ear normal. Oropharynx is clear and moist EYES: Conjunctivae and EOM are normal. Pupils are equal, round, and reactive to light. No scleral icterus.  NECK: Normal range of motion, supple, no masses SKIN: Skin is warm and dry. No rash noted. Not diaphoretic. No erythema. No pallor. NEUROLOGIC: Alert and oriented to person, place, and time. Normal reflexes, muscle tone coordination. No cranial nerve deficit noted. PSYCHIATRIC: Normal mood and affect. Normal behavior. Normal judgment and thought content. CARDIOVASCULAR: Normal heart rate noted, regular rhythm RESPIRATORY: Effort and breath sounds normal, no problems with respiration noted ABDOMEN: Soft, nontender, nondistended, gravid. MUSCULOSKELETAL: Normal range of motion. No edema and no tenderness. 2+ distal pulses.  Cervical Exam: Dilatation 1.5 cm   Effacement 50%   Station -2   Presentation: cephalic FHT:  Baseline  rate 135 bpm   Variability moderate  Accelerations present   Decelerations none Contractions: Uterine irritability   Pertinent Labs/Studies:   Results for orders placed or performed during the hospital encounter of 09/27/21 (from the past 24 hour(s))  CBC     Status: Abnormal   Collection Time: 09/27/21  5:30 AM  Result Value Ref Range   WBC 11.1 (H) 4.0 -  10.5 K/uL   RBC 3.58 (L) 3.87 - 5.11 MIL/uL   Hemoglobin 11.0 (L) 12.0 - 15.0 g/dL   HCT 32.6 (L) 36.0 - 46.0 %   MCV 91.1 80.0 - 100.0 fL   MCH 30.7 26.0 - 34.0 pg   MCHC 33.7 30.0 - 36.0 g/dL   RDW 14.7 11.5 - 15.5 %   Platelets 335 150 - 400 K/uL   nRBC 0.0 0.0 - 0.2 %  Type and screen     Status: None   Collection Time: 09/27/21  5:30 AM  Result Value Ref Range   ABO/RH(D) A POS    Antibody Screen NEG    Sample Expiration      09/30/2021,2359 Performed at [facility], [address], [place], [place] [address]     Assessment : CANDISE CRABTREE is a 41 y.o. [id number] at [redacted]w[redacted]d being admitted for induction of labor due to chronic HTN in pregnancy, on Labetalol. Advanced maternal age, late entry to prenatal care. Succenturiate placenta. H/o depression and anxiety this pregnancy, medicated.    Plan: Labor: Induction with Cytotec as ordered as per protocol. Analgesia as needed. Labetalol for BPs. FWB: Reassuring fetal heart tracing.  GBS negative. Delivery plan: Hopeful for vaginal delivery    Rubie Maid, MD [facility]

## 2021-09-28 ENCOUNTER — Encounter
Admission: EM | Disposition: A | Discharge status: Home / Self Care | Payer: Self-pay | Source: Home / Self Care | Attending: Obstetrics and Gynecology

## 2021-09-28 LAB — CBC
HCT: 33.8 % — ABNORMAL LOW (ref 36.0–46.0)
Hemoglobin: 11.1 g/dL — ABNORMAL LOW (ref 12.0–15.0)
MCH: 31.2 pg (ref 26.0–34.0)
MCHC: 32.8 g/dL (ref 30.0–36.0)
MCV: 94.9 fL (ref 80.0–100.0)
Platelets: 313 10*3/uL (ref 150–400)
RBC: 3.56 MIL/uL — ABNORMAL LOW (ref 3.87–5.11)
RDW: 15 % (ref 11.5–15.5)
WBC: 15.3 10*3/uL — ABNORMAL HIGH (ref 4.0–10.5)
nRBC: 0 % (ref 0.0–0.2)

## 2021-09-28 LAB — MEASLES/MUMPS/RUBELLA IMMUNITY
Mumps IgG: <9 AU/mL — ABNORMAL LOW (ref >10.9)
Rubella: 1.11 index (ref >0.99)
Rubeola IgG: 68.3 AU/mL (ref >16.4)

## 2021-09-28 LAB — VARICELLA ZOSTER ANTIBODY, IGG: Varicella IgG: 553 index (ref >165)

## 2021-09-28 SURGERY — LIGATION, FALLOPIAN TUBE, POSTPARTUM
Anesthesia: Choice | Laterality: Bilateral

## 2021-09-28 MED ORDER — DULOXETINE HCL 60 MG PO CPEP
60.0000 mg | ORAL_CAPSULE | Freq: Every day | ORAL | Status: DC
  Administered 2021-09-28 – 2021-09-29 (×2): 60 mg via ORAL
  Filled 2021-09-28 (×2): qty 1

## 2021-09-28 MED ORDER — BUPROPION HCL ER (SR) 150 MG PO TB12
150.0000 mg | ORAL_TABLET | Freq: Every day | ORAL | Status: DC
  Administered 2021-09-28 – 2021-09-29 (×2): 150 mg via ORAL
  Filled 2021-09-28 (×2): qty 1

## 2021-09-28 MED ORDER — AMLODIPINE BESYLATE 5 MG PO TABS
5.0000 mg | ORAL_TABLET | Freq: Every day | ORAL | Status: DC
  Filled 2021-09-28: qty 1

## 2021-09-28 MED ORDER — HYDROCHLOROTHIAZIDE 12.5 MG PO TABS
12.5000 mg | ORAL_TABLET | Freq: Every day | ORAL | Status: DC
  Administered 2021-09-28: 12.5 mg via ORAL
  Filled 2021-09-28: qty 1

## 2021-09-28 MED ORDER — HYDROCHLOROTHIAZIDE 25 MG PO TABS
25.0000 mg | ORAL_TABLET | Freq: Every day | ORAL | Status: DC
  Administered 2021-09-29: 25 mg via ORAL
  Filled 2021-09-28: qty 1

## 2021-09-28 MED ORDER — SOD CITRATE-CITRIC ACID 500-334 MG/5ML PO SOLN
ORAL | Status: AC
  Filled 2021-09-28: qty 15

## 2021-09-28 SURGICAL SUPPLY — 28 items
BACTOSHIELD CHG 4% 4OZ (MISCELLANEOUS) ×1
BLADE SURG SZ11 CARB STEEL (BLADE) ×2 IMPLANT
CHLORAPREP W/TINT 26 (MISCELLANEOUS) ×2 IMPLANT
DERMABOND ADVANCED (GAUZE/BANDAGES/DRESSINGS) ×1
DERMABOND ADVANCED .7 DNX12 (GAUZE/BANDAGES/DRESSINGS) ×1 IMPLANT
DRAPE LAPAROTOMY 100X77 ABD (DRAPES) ×2 IMPLANT
GAUZE 4X4 16PLY ~~LOC~~+RFID DBL (SPONGE) ×2 IMPLANT
GLOVE SURG ENC MOIS LTX SZ6.5 (GLOVE) ×2 IMPLANT
GLOVE SURG UNDER LTX SZ7 (GLOVE) ×2 IMPLANT
GOWN STRL REUS W/ TWL LRG LVL3 (GOWN DISPOSABLE) ×2 IMPLANT
GOWN STRL REUS W/TWL LRG LVL3 (GOWN DISPOSABLE) ×2
KIT TURNOVER CYSTO (KITS) ×2 IMPLANT
MANIFOLD NEPTUNE II (INSTRUMENTS) ×2 IMPLANT
NEEDLE HYPO 25GX1X1/2 BEV (NEEDLE) ×2 IMPLANT
NS IRRIG 500ML POUR BTL (IV SOLUTION) ×2 IMPLANT
PACK BASIN MINOR ARMC (MISCELLANEOUS) ×2 IMPLANT
SCRUB CHG 4% DYNA-HEX 4OZ (MISCELLANEOUS) ×1 IMPLANT
SUT MNCRL 4-0 (SUTURE) ×1
SUT MNCRL 4-0 27XMFL (SUTURE) ×1
SUT PLAIN GUT 0 (SUTURE) ×4 IMPLANT
SUT VIC AB 0 CT1 36 (SUTURE) IMPLANT
SUT VIC AB 0 SH 27 (SUTURE) IMPLANT
SUT VIC AB 3-0 SH 27 (SUTURE) ×1
SUT VIC AB 3-0 SH 27X BRD (SUTURE) ×1 IMPLANT
SUT VICRYL 0 AB UR-6 (SUTURE) ×4 IMPLANT
SUTURE MNCRL 4-0 27XMF (SUTURE) ×1 IMPLANT
SYR 10ML LL (SYRINGE) ×2 IMPLANT
WATER STERILE IRR 500ML POUR (IV SOLUTION) ×2 IMPLANT

## 2021-09-28 NOTE — Progress Notes (Signed)
Notified Dr. Marcelline Mates that patient has requested to wait to have BTL until later date

## 2021-09-28 NOTE — Progress Notes (Addendum)
Post Partum Day # 1, s/p SVD  Subjective: Reports some swelling in hands and feet. Otherwise doing well. Up ad lib, tolerating diet.   Objective: Temp:  [97.9 F (36.6 C)-98.6 F (37 C)] 98.3 F (36.8 C) (02/11 0812) Pulse Rate:  [67-138] 73 (02/11 0812) Resp:  [16-19] 16 (02/11 0812) BP: (128-157)/(74-94) 130/88 (02/11 0812) SpO2:  [97 %-100 %] 100 % (02/11 2010)  Physical Exam:  General: alert and no distress  Lungs: clear to auscultation bilaterally Breasts: normal appearance, no masses or tenderness Heart: regular rate and rhythm, S1, S2 normal, no murmur, click, rub or gallop Abdomen: soft, non-tender; bowel sounds normal; no masses,  no organomegaly Pelvis: Lochia: appropriate, Uterine Fundus: firm Extremities: DVT Evaluation: No evidence of DVT seen on physical exam. Negative Homan's sign. No cords or calf tenderness. No significant calf/ankle edema.    Recent Labs    09/27/21 0530 09/28/21 0631  HGB 11.0* 11.1*  HCT 32.6* 33.8*    Assessment/Plan: - Overall doing well - Breastfeeding, Lactation consult - Social Work consult for late [facility] - Circumcision prior to discharge - cHTN in pregnancy, was on low dose of Labetalol however noted that she did not like the way it made her feel. Held BP meds after delivery however noting increase in values. Will change to Norvasc 5 mg.  - Contraception: Initially desired tubal ligation, however held this morning at patient's request.  If patient remains for an additional day, can perform prior to discharge. Otherwise, can perform interval tubal after 6 weeks.  - H/o anxiety and depression, on Wellbutrin and Cymbalta. Can continue.  Dispo: likely d/c home either later today or tomorrow.    LOS: 1 day   Rubie Maid, MD [facility] 09/28/2021 9:37 AM

## 2021-09-28 NOTE — Anesthesia Postprocedure Evaluation (Signed)
Anesthesia Post Note  Patient: Krystal Blackwell  Procedure(s) Performed: AN AD HOC LABOR EPIDURAL  Patient location during evaluation: Mother Baby Anesthesia Type: Epidural Level of consciousness: awake and alert Pain management: pain level controlled Vital Signs Assessment: post-procedure vital signs reviewed and stable Respiratory status: spontaneous breathing, nonlabored ventilation and respiratory function stable Cardiovascular status: stable Postop Assessment: no headache, no backache, no apparent nausea or vomiting, able to ambulate and adequate PO intake Anesthetic complications: no   No notable events documented.   Last Vitals:  Vitals:   09/28/21 0812 09/28/21 1145  BP: 130/88 139/88  Pulse: 73 78  Resp: 16 15  Temp: 36.8 C 36.6 C  SpO2: 100% 99%    Last Pain:  Vitals:   09/28/21 1145  TempSrc: Oral  PainSc:                  Martha Clan

## 2021-09-28 NOTE — Anesthesia Post-op Follow-up Note (Signed)
°  Anesthesia Pain Follow-up Note  Patient: Krystal Blackwell  Day #: 1  Date of Follow-up: 09/28/2021 Time: 11:50 AM  Last Vitals:  Vitals:   09/28/21 0812 09/28/21 1145  BP: 130/88 139/88  Pulse: 73 78  Resp: 16 15  Temp: 36.8 C 36.6 C  SpO2: 100% 99%    Level of Consciousness: alert  Pain: none   Side Effects:None  Catheter Site Exam:clean, dry     Plan: D/C from anesthesia care at surgeon's request  Martha Clan

## 2021-09-28 NOTE — TOC Progression Note (Signed)
Transition of Care [facility]) - Progression Note    Patient Details  Name: Krystal Blackwell MRN: [id number] Date of Birth: 01-Nov-1980  Transition of Care [facility]) CM/SW [place], [place] Phone Number: 09/28/2021, 10:01 AM  Clinical Narrative:     CSW spoke to patient and patient reported having primary care physician- [facility]. Patient reported having support and essential baby items (crib, car seat, etc.). No further TOC needs.        Expected Discharge Plan and Services                                                 Social Determinants of Health (SDOH) Interventions    Readmission Risk Interventions No flowsheet data found.

## 2021-09-28 NOTE — Discharge Summary (Signed)
Postpartum Discharge Summary      Patient Name: Krystal Blackwell DOB: [birth year] MRN: [id number]  Date of admission: 09/27/2021 Delivery date:09/27/2021  Delivering provider: Rubie Maid  Date of discharge: 09/29/2021  Admitting diagnosis: Hypertension affecting pregnancy [O16.9] Intrauterine pregnancy: [redacted]w[redacted]d    Secondary diagnosis:  Principal Problem:   Hypertension affecting pregnancy Active Problems:   Depression with anxiety   Late prenatal care affecting pregnancy in third trimester   Placenta succenturiata  Additional problems: Advanced maternal age in a multigravada    Discharge diagnosis: Term Pregnancy Delivered and CHTN                                              Post partum procedures: None Augmentation: Cytotec Complications: None  Hospital course: Induction of Labor With Vaginal Delivery   41y.o. yo [id number] admitted to the hospital 09/27/2021 for induction of labor.  Indication for induction:  chronic Hypertension in pregnancy .  Patient had an uncomplicated labor course as follows: Membrane Rupture Time/Date: 2:30 PM ,09/27/2021   Delivery Method:Vaginal, Spontaneous  Episiotomy: None  Lacerations:  None  Details of delivery can be found in separate delivery note.  Patient had a routine postpartum course. Patient is discharged home 09/29/21.  Newborn Data: Birth date:09/27/2021  Birth time:2:32 PM  Gender:Female  Living status:Living  Apgars:7 ,8  Weight:2850 g   Magnesium Sulfate received: No BMZ received: No Rhophylac:No MMR:No T-DaP:Given prenatally Flu: No Transfusion:No  Physical exam  Vitals:   09/28/21 1933 09/28/21 2304 09/29/21 0411 09/29/21 0820  BP: (!) 143/98 128/82 (!) 151/85 121/77  Pulse: 74 69 65 65  Resp:  18  17  Temp: 98 F (36.7 C) 97.9 F (36.6 C) 98.2 F (36.8 C) 97.6 F (36.4 C)  TempSrc: Oral Oral Oral Oral  SpO2: 100% 100% 100% 98%  Weight:      Height:       General: alert and  cooperative Lochia: appropriate Uterine Fundus: firm Incision: N/A DVT Evaluation: No evidence of DVT seen on physical exam. Negative Homan's sign. No cords or calf tenderness. No significant calf/ankle edema. Labs: Lab Results  Component Value Date   WBC 15.3 (H) 09/28/2021   HGB 11.1 (L) 09/28/2021   HCT 33.8 (L) 09/28/2021   MCV 94.9 09/28/2021   PLT 313 09/28/2021   CMP Latest Ref Rng & Units 08/21/2021  Glucose 70 - 99 mg/dL 134(H)  BUN 6 - 24 mg/dL 5(L)  Creatinine 0.57 - 1.00 mg/dL 0.64  Sodium 134 - 144 mmol/L 137  Potassium 3.5 - 5.2 mmol/L 3.9  Chloride 96 - 106 mmol/L 103  CO2 20 - 29 mmol/L 18(L)  Calcium 8.7 - 10.2 mg/dL 8.4(L)  Total Protein 6.0 - 8.5 g/dL 6.2  Total Bilirubin 0.0 - 1.2 mg/dL 0.3  Alkaline Phos 44 - 121 IU/L 121  AST 0 - 40 IU/L 20  ALT 0 - 32 IU/L 14   Edinburgh Score: Edinburgh Postnatal Depression Scale Screening Tool 09/27/2021  I have been able to laugh and see the funny side of things. (No Data)      After visit meds:  Allergies as of 09/29/2021       Reactions   Avelox [moxifloxacin Hcl In Nacl] Swelling   Abilify [aripiprazole] Swelling   Made jaw twitch  Medication List     STOP taking these medications    labetalol 100 MG tablet Commonly known as: NORMODYNE   ondansetron 4 MG disintegrating tablet Commonly known as: ZOFRAN-ODT   ondansetron 8 MG disintegrating tablet Commonly known as: ZOFRAN-ODT       TAKE these medications    buPROPion 150 MG 12 hr tablet Commonly known as: WELLBUTRIN SR Take 150 mg by mouth daily.   DULoxetine 20 MG capsule Commonly known as: CYMBALTA Take 60 mg by mouth daily.   hydrochlorothiazide 25 MG tablet Commonly known as: HYDRODIURIL Take 1 tablet (25 mg total) by mouth daily. Start taking on: September 30, 2021   ibuprofen 600 MG tablet Commonly known as: ADVIL Take 1 tablet (600 mg total) by mouth every 6 (six) hours.   montelukast 10 MG tablet Commonly known  as: SINGULAIR Take 10 mg by mouth at bedtime.         Discharge home in stable condition Infant Feeding: Bottle and Breast Infant Disposition:home with mother Discharge instruction: per After Visit Summary and Postpartum booklet. Activity: Advance as tolerated. Pelvic rest for 6 weeks.  Diet: routine diet Anticipated Birth Control: Plans Interval BTL Postpartum Appointment:6 weeks Additional Postpartum F/U: Postpartum Depression checkup and BP check 1 week Future Appointments:No future appointments. Follow up Visit:  Follow-up Information     Rubie Maid, MD Follow up.   Specialties: Obstetrics and Gynecology, Radiology Why: 1 week for BP check 3 weeks for PP mood check (televisit) 6 weeks postpartum visit Contact information: [place] [address] [phone]                     09/28/2021 Rubie Maid, MD [facility]

## 2021-09-29 MED ORDER — IBUPROFEN 600 MG PO TABS: 600.0000 mg | ORAL_TABLET | Freq: Four times a day (QID) | ORAL | 0 refills | Status: DC

## 2021-09-29 MED ORDER — HYDROCHLOROTHIAZIDE 25 MG PO TABS: 25.0000 mg | ORAL_TABLET | Freq: Every day | ORAL | 1 refills | Status: DC

## 2021-09-29 NOTE — Lactation Note (Signed)
This note was copied from a baby's chart. Lactation Consultation Note  Patient Name: Krystal Blackwell [id number] Date: 09/29/2021 Reason for consult: Follow-up assessment;Initial assessment Age:42 hours  Maternal Data Has patient been taught Hand Expression?: Yes Does the patient have breastfeeding experience prior to this delivery?: Yes  Feeding Mother's Current Feeding Choice: Breast Milk Reviewed age appropriate feeding. Reviewed INJOY booklet for normal newborn output.    Lactation Tools Discussed/Used Reviewed manual and electric pump as well as storage.  Also reviewed hand expression and recommended (post procedure).  Also discussed going back to work outside home and pumping timeframe.      Interventions Interventions: Breast feeding basics reviewed;Hand express;Hand pump;Ice;Education (outpatient support - resources provided)   Discharge Discharge Education: Engorgement and breast care;Outpatient recommendation;Warning signs for feeding baby Pump: Manual;Personal;DEBP WIC Program: No  Consult Status Consult Status: Complete (mother declined follow up) (number provided to call for any questions prior discharge and after)    Dionne Milo An [name] 09/29/2021, 11:52 AM

## 2021-09-29 NOTE — Progress Notes (Signed)
Patient discharged with infant. Discharge instructions, prescriptions, and follow up appointments given to and reviewed with patient. Patient verbalized understanding. Escorted out by NT.

## 2021-10-01 ENCOUNTER — Other Ambulatory Visit: Payer: Self-pay

## 2021-10-01 ENCOUNTER — Ambulatory Visit (INDEPENDENT_AMBULATORY_CARE_PROVIDER_SITE_OTHER): Payer: BC Managed Care – PPO | Admitting: Obstetrics and Gynecology

## 2021-10-01 ENCOUNTER — Encounter: Payer: Self-pay | Admitting: Obstetrics and Gynecology

## 2021-10-01 VITALS — BP 146/88 | HR 82

## 2021-10-01 DIAGNOSIS — Z0130 Encounter for examination of blood pressure without abnormal findings: Secondary | ICD-10-CM | POA: Diagnosis not present

## 2021-10-01 NOTE — Progress Notes (Signed)
Patient is here for a blood pressure check. Patient denies chest pain, palpitations, shortness of breath or visual disturbances. At previous visit blood pressure was 151/85 with a heart rate of 75. Today during nurse visit first check blood pressure was 146/88 with a heart rate of 82. After resting for 10 minutes it was 131/76 and heart rate was 88. She does take HCTZ and she has not missed any doses.

## 2021-10-03 ENCOUNTER — Telehealth: Payer: Self-pay | Admitting: Obstetrics and Gynecology

## 2021-10-03 NOTE — Telephone Encounter (Signed)
Pt called stating that she was rx blood pressure medication that was 20 mg versus the 12.5 she was on prior- pt is concerned this is too strong as she is dizzy, tired and experiencing excessive dry mouth. Please Advise.

## 2021-10-03 NOTE — Telephone Encounter (Signed)
Would you like for her to decrease back to half tablet. Please advise. CB

## 2021-10-04 NOTE — Telephone Encounter (Signed)
Called patient to let her know that it was ok to decrease HCTZ back to 12.5 mg. She said she did that this morning, symptoms have resolved and she feel much better.

## 2021-10-18 ENCOUNTER — Telehealth: Payer: BC Managed Care – PPO | Admitting: Obstetrics and Gynecology

## 2021-10-22 NOTE — Progress Notes (Signed)
? ?Virtual Visit via Video Note ? ?I connected with Krystal Blackwell on 10/23/21 at  10:45 AM by a video enabled telemedicine application and verified that I am speaking with the correct person using two identifiers. ? ?Location: ?Patient: Home ?Provider: Office ?  ?I discussed the limitations of evaluation and management by telemedicine and the availability of in person appointments. The patient expressed understanding and agreed to proceed. ? ?  ?History of Present Illness: ?  ?Krystal Blackwell is a 41 y.o. G67P4014 female who presents for a 2 week televisit for mood check. She is 2 weeks postpartum following a spontaneous vaginal delivery.  The delivery was at 2 gestational weeks.  Pregnancy was complicated by chronic hypertension in pregnancy, anxiety and depression, late entry to prenatal care.  Postpartum course has been well so far. Baby is feeding by breast. Bleeding: Is minimal. Postpartum depression screening: negative.  EDPS score is 8.  Patient reports that she is beginning to feel dizzy and lightheaded on her dose of HCTZ, decreased from 25 mg to 12.5 mg.  Notes that her symptoms have improved however thinks her blood pressures may be able to continue to be controlled off of medication.  Contraception discussed, patient initially wanted tubal ligation, however is currently on the fence due to concerns regarding breast-feeding and length of time in the hospital away from her baby. ?  ?  ?The following portions of the patient's history were reviewed and updated as appropriate: allergies, current medications, past family history, past medical history, past social history, past surgical history, and problem list. ?  ?Observations/Objective: ?  ?Height '5\' 1"'$  (1.549 m), weight 170 lb (77.1 kg), currently breastfeeding. ?Gen App: NAD ?Psych: normal speech, affect. Good mood.  ?  ? ?Edinburgh Postnatal Depression Scale Screening Tool 10/23/2021 09/28/2021 09/27/2021  ?I have been able to laugh and see the funny  side of things. 0 1 (No Data)  ?I have looked forward with enjoyment to things. 0 0 -  ?I have blamed myself unnecessarily when things went wrong. 2 2 -  ?I have been anxious or worried for no good reason. 2 2 -  ?I have felt scared or panicky for no good reason. 1 1 -  ?Things have been getting on top of me. 1 1 -  ?I have been so unhappy that I have had difficulty sleeping. 1 1 -  ?I have felt sad or miserable. 1 1 -  ?I have been so unhappy that I have been crying. 0 1 -  ?The thought of harming myself has occurred to me. 0 0 -  ?Edinburgh Postnatal Depression Scale Total 8 10 -  ?  ? ? ? ?  ?Assessment and Plan: ?  ?1. Encounter for screening for maternal depression ?- Screening negative today. Overall doing well.  Will rescreen at 6 week postpartum visit. We will also screen for anxiety at that visit. ?  ?2. Postpartum state ?- Overall doing well. Continue routine postpartum home care.  ? ?3. Contraception counseling ?-Discussed contraception with patient, initially desired tubal ligation, however has concerns about being away from her baby will lengthy period of time as infant is currently breast-fed and does not take bottles.  Is worried that her infant will go hungry while she is undergoing surgery.  I discussed alternative options for birth control.  I also discussed with the nature and length of the procedure and that her infant would not necessarily require lengthy periods of time without feeds.  Patient  will think about it and will discuss further at her postpartum visit. May consider IUD. ?  ?4. Lactating mother ?-Patient doing well with lactation. ? ?5.  Essential hypertension ? -Patient has reduced blood pressure medicine from 25 mg to 12.5 mg of HCTZ.  We will follow-up in the next several weeks at a postpartum visit.  Discussed that exacerbation of her hypertension may occur only during pregnancy and that she may be able to come off of her medications again after her postpartum visit. ?  ?Follow Up  Instructions: ?Follow up in 2-3 weeks for postpartum visit.  ?  ?I discussed the assessment and treatment plan with the patient. The patient was provided an opportunity to ask questions and all were answered. The patient agreed with the plan and demonstrated an understanding of the instructions. ?  ?The patient was advised to call back or seek an in-person evaluation if the symptoms worsen or if the condition fails to improve as anticipated. ?  ?I provided 11 minutes of non-face-to-face time during this encounter. ?  ?  ?Rubie Maid, MD ?[facility] ? ?

## 2021-10-23 ENCOUNTER — Telehealth (INDEPENDENT_AMBULATORY_CARE_PROVIDER_SITE_OTHER): Payer: BC Managed Care – PPO | Admitting: Obstetrics and Gynecology

## 2021-10-23 ENCOUNTER — Encounter: Payer: Self-pay | Admitting: Obstetrics and Gynecology

## 2021-10-23 VITALS — Ht 61.0 in | Wt 170.0 lb

## 2021-10-23 DIAGNOSIS — Z1332 Encounter for screening for maternal depression: Secondary | ICD-10-CM

## 2021-10-23 DIAGNOSIS — R42 Dizziness and giddiness: Secondary | ICD-10-CM

## 2021-10-23 DIAGNOSIS — I10 Essential (primary) hypertension: Secondary | ICD-10-CM

## 2021-10-23 DIAGNOSIS — Z3009 Encounter for other general counseling and advice on contraception: Secondary | ICD-10-CM

## 2021-11-01 LAB — FETAL NONSTRESS TEST

## 2021-11-06 NOTE — Progress Notes (Signed)
? ?OBSTETRICS POSTPARTUM CLINIC PROGRESS NOTE ? ?Subjective:  ?  ? YOUSRA Blackwell is a 41 y.o. [id number] female who presents for a postpartum visit. She is 6 weeks postpartum following a spontaneous vaginal delivery. I have fully reviewed the prenatal and intrapartum course. The delivery was at 58 gestational weeks.  Anesthesia: epidural. Postpartum course has been uncomplicated. Baby's course has been uncomplicated. Baby is feeding by breast. Notes a decreased milk supply over the past 2 weeks.  Had some left over Reglan that she took which did help, but noted facial ticks. Bleeding: patient has not resumed menses, with No LMP recorded (lmp unknown). Bowel function is normal. Bladder function is normal. Patient is not sexually active. Contraception method desired is  implant, wants to discuss options . EDPS score is 7.  ? ? ?The following portions of the patient's history were reviewed and updated as appropriate: allergies, current medications, past family history, past medical history, past social history, past surgical history, and problem list. ? ?Review of Systems ?A comprehensive review of systems was negative except for: Musculoskeletal: positive for muscle weakness ?Skin lesions on left arm and right eye, thinks they are lipomas.   ? ?Objective:  ? ? BP 121/63   Pulse (!) 104   Resp 16   Ht '5\' 1"'$  (1.549 m)   Wt 166 lb 9.6 oz (75.6 kg)   LMP  (LMP Unknown)   Breastfeeding Yes   BMI 31.48 kg/m?   ?General:  alert and no distress  ? Breasts:  inspection negative, no nipple discharge or bleeding, no masses or nodularity palpable  ?Lungs: clear to auscultation bilaterally  ?Heart:  regular rate and rhythm, S1, S2 normal, no murmur, click, rub or gallop  ?Abdomen: soft, non-tender; bowel sounds normal; no masses,  no organomegaly.  Well healed Pfannenstiel incision  ? Vulva:  normal  ?Vagina: normal vagina, no discharge, exudate, lesion, or erythema  ?Cervix:  no cervical motion tenderness and no lesions   ?Corpus: normal size, contour, position, consistency, mobility, non-tender  ?Adnexa:  normal adnexa and no mass, fullness, tenderness  ?Skin: Large fleshy fatty subdermal lesion on left arm, non-tender, slightly mobile, ~ 4 x 5 cm. Also notes miniscule lesion on right upper eyelid but this was one is not as palpable.   ?      ? ?Labs:  ?Lab Results  ?Component Value Date  ? HGB 11.1 (L) 09/28/2021  ? ? ? ?Assessment:  ? ?1. Postpartum care following vaginal delivery   ?2. Lipoma of left upper extremity   ?3. Essential hypertension   ?4. Decreased lactation   ?5. Nexplanon insertion   ?6. Depression with anxiety   ?  ? ?Plan:  ? ?1. Contraception: Nexplanon.  Inserted today, see procedure note below.  ?2. Continue Microzide for HTN control. ?3. Discussed decreased lactation, offered options of additional supplements, or can continue to use Reglan intermittently due to side effects of facial ticks.  ?4. H/o depression with anxiety, currently on Cymbalta.  ?5. Referral to Dermatology for evaluation and management of suspected lipoma.  ?6. Follow up in:  4-6  months or as needed for annual.  ? ? ? ? ?GYNECOLOGY OFFICE PROCEDURE NOTE ? ?Krystal Blackwell is a 41 y.o. [id number] here for Nexplanon  insertion.  Last pap smear was on 03/07/2019 and was normal.  No other gynecologic concerns. ? ?Nexplanon Insertion Procedure ?Patient identified, informed consent performed, consent signed.   Patient does understand that irregular bleeding is a very  common side effect of this medication. She was advised to have backup contraception for one week after placement. Pregnancy test in clinic today was negative.  Appropriate time out taken.  Patient's left arm was prepped and draped in the usual sterile fashion. The ruler used to measure and mark insertion area.  Patient was prepped with alcohol swab and then injected with 3 ml of 1% lidocaine.  She was prepped with betadine, Nexplanon removed from packaging,  Device confirmed in needle,  then inserted full length of needle and withdrawn per handbook instructions. Nexplanon was able to palpated in the patient's arm; patient palpated the insert herself. There was minimal blood loss.  Patient insertion site covered with guaze and a pressure bandage to reduce any bruising.  The patient tolerated the procedure well and was given post procedure instructions.  ? ? ?Exp: 06/21/2023 ?Lot: [id number] ? ?  ?Rubie Maid, MD ?[facility] ? ?

## 2021-11-07 ENCOUNTER — Other Ambulatory Visit: Payer: Self-pay

## 2021-11-07 ENCOUNTER — Encounter: Payer: Self-pay | Admitting: Obstetrics and Gynecology

## 2021-11-07 ENCOUNTER — Ambulatory Visit (INDEPENDENT_AMBULATORY_CARE_PROVIDER_SITE_OTHER): Payer: BC Managed Care – PPO | Admitting: Obstetrics and Gynecology

## 2021-11-07 DIAGNOSIS — O924 Hypogalactia: Secondary | ICD-10-CM

## 2021-11-07 DIAGNOSIS — Z30017 Encounter for initial prescription of implantable subdermal contraceptive: Secondary | ICD-10-CM

## 2021-11-07 DIAGNOSIS — F418 Other specified anxiety disorders: Secondary | ICD-10-CM

## 2021-11-07 DIAGNOSIS — I10 Essential (primary) hypertension: Secondary | ICD-10-CM | POA: Diagnosis not present

## 2021-11-07 DIAGNOSIS — D1722 Benign lipomatous neoplasm of skin and subcutaneous tissue of left arm: Secondary | ICD-10-CM

## 2021-11-08 ENCOUNTER — Encounter: Payer: BC Managed Care – PPO | Admitting: Obstetrics and Gynecology

## 2021-11-11 ENCOUNTER — Ambulatory Visit: Payer: BC Managed Care – PPO | Admitting: Surgery

## 2021-11-19 ENCOUNTER — Ambulatory Visit: Payer: BC Managed Care – PPO | Admitting: Surgery

## 2021-11-25 ENCOUNTER — Ambulatory Visit (INDEPENDENT_AMBULATORY_CARE_PROVIDER_SITE_OTHER): Payer: BC Managed Care – PPO | Admitting: Surgery

## 2021-11-25 ENCOUNTER — Encounter: Payer: Self-pay | Admitting: Surgery

## 2021-11-25 VITALS — BP 112/73 | HR 68 | Temp 98.2°F | Ht 61.0 in | Wt 168.0 lb

## 2021-11-25 DIAGNOSIS — R2232 Localized swelling, mass and lump, left upper limb: Secondary | ICD-10-CM

## 2021-11-25 DIAGNOSIS — M7989 Other specified soft tissue disorders: Secondary | ICD-10-CM

## 2021-11-25 DIAGNOSIS — Z1231 Encounter for screening mammogram for malignant neoplasm of breast: Secondary | ICD-10-CM

## 2021-11-25 NOTE — Patient Instructions (Signed)
You may call [facility] to schedule your mammogram [phone]. ? ?We will call you about the CT scan. ? ?Follow up here after your CT scan.  ?

## 2021-11-25 NOTE — Progress Notes (Signed)
Patient ID: Krystal Blackwell, female   DOB: [birth year], 41 y.o.   MRN: [id number] ? ?HPI ?BRITIANY Blackwell is a 41 y.o. female seen in consultation at the request of Dr. Marcelline Mates. ?Endorses a left upper extremity growth in the subcutaneous tissue has been present for several years.  She noticed that over the last year or so he has increasing in size.. Some discomfort and mild dull pain at times.  No other specific alleviating or aggravating factors. ?Did have a normal vaginal delivery 2 months ago.  She has recovered well. ?SHe is otherwise healthy and is able to perform more than 4 METS of activity without any shortness of or chest pain. ?CT scan personally reviewed from last year showing left adrenal nodule and some retained products in the uterus. ?SHe has had multiple spinal surgeries, scoliosis as a teenager ? ? ? ?HPI ? ?Past Medical History:  ?Diagnosis Date  ? Blood transfusion without reported diagnosis   ? 6837,2902  ? Depression   ? Diverticulitis   ? Diverticulosis   ? Ovarian cyst   ? Pelvic pain in female   ? Recurrent sinus infections   ? Scoliosis   ? ? ?Past Surgical History:  ?Procedure Laterality Date  ? NASAL SEPTUM SURGERY  11/2013  ? SPINE SURGERY  97,98,99,00  ? scoliosis  ? ? ?Family History  ?Problem Relation Age of Onset  ? Asthma Mother   ? Hypertension Mother   ? Miscarriages / [place] Mother   ? Alcohol abuse Father   ? Drug abuse Father   ? Pancreatic cancer Father   ? Arthritis Maternal Grandmother   ? Colon cancer Neg Hx   ? Stomach cancer Neg Hx   ? Esophageal cancer Neg Hx   ? Rectal cancer Neg Hx   ? Liver cancer Neg Hx   ? ? ?Social History ?Social History  ? ?Tobacco Use  ? Smoking status: Former  ?  Types: Cigarettes  ?  Quit date: 2022  ?  Years since quitting: 1.2  ? Smokeless tobacco: Never  ? Tobacco comments:  ?  2-3 CIGARETTES EVERY TWO WEEKS  ?Vaping Use  ? Vaping Use: Never used  ?Substance Use Topics  ? Alcohol use: Not Currently  ?  Alcohol/week: 6.0 standard drinks  ?   Types: 6 Cans of beer per week  ? Drug use: No  ? ? ?Allergies  ?Allergen Reactions  ? Avelox [Moxifloxacin Hcl In Nacl] Swelling  ? Abilify [Aripiprazole] Swelling  ?  Made jaw twitch  ? ? ?Current Outpatient Medications  ?Medication Sig Dispense Refill  ? DULoxetine (CYMBALTA) 20 MG capsule Take 60 mg by mouth daily.    ? hydrochlorothiazide (MICROZIDE) 12.5 MG capsule Take by mouth.    ? ?No current facility-administered medications for this visit.  ? ? ? ?Review of Systems ?Full ROS  was asked and was negative except for the information on the HPI ? ?Physical Exam ?Blood pressure 112/73, pulse 68, temperature 98.2 ?F (36.8 ?C), height '5\' 1"'$  (1.549 m), weight 168 lb (76.2 kg), SpO2 98 %, currently breastfeeding. ?CONSTITUTIONAL: NAD. ?EYES: Pupils are equal, round, Sclera are non-icteric. ?EARS, NOSE, MOUTH AND THROAT:  The oral mucosa is pink and moist. Hearing is intact to voice. ?LYMPH NODES:  Lymph nodes in the neck are normal. ?RESPIRATORY:  Lungs are clear. There is normal respiratory effort, with equal breath sounds bilaterally, and without pathologic use of accessory muscles. ?CARDIOVASCULAR: Heart is regular without murmurs, gallops, or rubs. ?  GI: The abdomen is  soft, nontender, and nondistended. There are no palpable masses. There is no hepatosplenomegaly. There are normal bowel sounds in all quadrants. ?GU: Rectal deferred.   ?MUSCULOSKELETAL: Normal muscle strength and tone. No cyanosis or edema.   ?SKIN: There is a large lipoma on the left upper arm measuring 10 x 8 cm mobile.Marland Kitchen ?NEUROLOGIC: Motor and sensation is grossly normal. Cranial nerves are grossly intact. ?PSYCH:  Oriented to person, place and time. Affect is normal. ? ?Data Reviewed ? ?I have personally reviewed the patient's imaging, laboratory findings and medical records.   ? ?Assessment/Plan ?41 year old female with an enlarging subcu mass left upper arm consistent with lipoma.  Given that it is a large size I will like to interrogate  its characteristics with a CT scan of the arm to make sure were not dealing with a malignant lesion.  Discussed with her in detail about her disease process.  She wishes to have this excised which I think is very reasonable.  After we get the CT scan we will also likely plan for excision in the operating room as this is a large lipoma in a sensitive area.  Procedure discussed with patient in detail.  Usual postop care.  Risks, benefits and possible conditions.  She is in agreement with plan. ?A copy of this report was sent to the referring provider ? ?Please note that I have spent 40 minutes in this encounter including reviewing medical records, imaging studies, coordinating his care, placing orders and performing appropriate documentation ? ?Caroleen Hamman, MD FACS ?General Surgeon ?11/25/2021, 5:06 PM ? ?  ?

## 2021-11-26 ENCOUNTER — Telehealth: Payer: Self-pay

## 2021-11-26 NOTE — Telephone Encounter (Addendum)
Call to patient to notify them of their CT scan. ?They are scheduled for a CT scan of the left arm at [place] on 12/09/21. The arrival time is 3:30 pm and they are to have clear liquids only for 4 hours prior to the scan.  ?

## 2021-11-27 ENCOUNTER — Encounter: Payer: Self-pay | Admitting: Surgery

## 2021-11-28 NOTE — Telephone Encounter (Signed)
Spoke with patient and notified her of imaging information.  ?

## 2021-12-06 ENCOUNTER — Other Ambulatory Visit: Payer: Self-pay

## 2021-12-06 DIAGNOSIS — M7989 Other specified soft tissue disorders: Secondary | ICD-10-CM

## 2021-12-09 ENCOUNTER — Ambulatory Visit: Admission: RE | Admit: 2021-12-09 | Payer: BC Managed Care – PPO | Source: Ambulatory Visit

## 2021-12-10 ENCOUNTER — Ambulatory Visit
Admission: RE | Admit: 2021-12-10 | Discharge: 2021-12-10 | Disposition: A | Discharge status: Home / Self Care | Payer: BC Managed Care – PPO | Source: Ambulatory Visit | Attending: Surgery | Admitting: Surgery

## 2021-12-10 DIAGNOSIS — M7989 Other specified soft tissue disorders: Secondary | ICD-10-CM | POA: Diagnosis present

## 2021-12-10 MED ORDER — IOHEXOL 300 MG/ML  SOLN
100.0000 mL | Freq: Once | INTRAMUSCULAR | Status: AC | PRN
  Administered 2021-12-10: 100 mL via INTRAVENOUS

## 2021-12-18 ENCOUNTER — Encounter: Payer: Self-pay | Admitting: Surgery

## 2021-12-18 ENCOUNTER — Ambulatory Visit (INDEPENDENT_AMBULATORY_CARE_PROVIDER_SITE_OTHER): Payer: BC Managed Care – PPO | Admitting: Surgery

## 2021-12-18 VITALS — BP 143/94 | HR 93 | Temp 98.3°F | Wt 174.2 lb

## 2021-12-18 DIAGNOSIS — M7989 Other specified soft tissue disorders: Secondary | ICD-10-CM | POA: Diagnosis not present

## 2021-12-18 DIAGNOSIS — R2232 Localized swelling, mass and lump, left upper limb: Secondary | ICD-10-CM

## 2021-12-18 NOTE — Patient Instructions (Addendum)
Our surgery scheduler Barbara will call you within 24-48 hours to get you scheduled. If you have not heard from her after 48 hours, please call our office. Have the blue sheet available when she calls to write down important information.   If you have any concerns or questions, please feel free to call our office.   Lipoma Removal  Lipoma removal is a surgical procedure to remove a lipoma, which is a noncancerous (benign) tumor that is made up of fat cells. Most lipomas are small and painless and do not require treatment. They can form in many areas of the body but are most common under the skin of the back, arms, shoulders, buttocks, and thighs. You may need lipoma removal if you have a lipoma that is large, growing, or causing discomfort. Lipoma removal may also be done for cosmetic reasons. Tell a health care provider about: Any allergies you have. All medicines you are taking, including vitamins, herbs, eye drops, creams, and over-the-counter medicines. Any problems you or family members have had with anesthetic medicines. Any bleeding problems you have. Any surgeries you have had. Any medical conditions you have. Whether you are pregnant or may be pregnant. What are the risks? Generally, this is a safe procedure. However, problems may occur, including: Infection. Bleeding. Scarring. Allergic reactions to medicines. Damage to nearby structures or organs, such as damage to nerves or blood vessels near the lipoma. What happens before the procedure? When to Stop Eating and Drinking Follow instructions from your health care provider about what you may eat and drink before your procedure. These may include: 8 hours before your procedure Stop eating most foods. Do not eat meat, fried foods, or fatty foods. Eat only light foods, such as toast or crackers. All liquids are okay except energy drinks and alcohol. 6 hours before your procedure Stop eating. Drink only clear liquids, such as  water, clear fruit juice, black coffee, plain tea, and sports drinks. Do not drink energy drinks or alcohol. 2 hours before your procedure Stop drinking all liquids. You may be allowed to take medicines with small sips of water. If you do not follow your health care provider's instructions, your procedure may be delayed or canceled. Medicines Ask your health care provider about: Changing or stopping your regular medicines. This is especially important if you are taking diabetes medicines or blood thinners. Taking medicines such as aspirin and ibuprofen. These medicines can thin your blood. Do not take these medicines unless your health care provider tells you to take them. Taking over-the-counter medicines, vitamins, herbs, and supplements. General instructions You will have a physical exam. Your health care provider will check the size of the lipoma and whether it can be removed easily. You may have a biopsy and imaging tests, such as X-rays, a CT scan, and an MRI. Do not use any products that contain nicotine or tobacco for at least 4 weeks before the procedure. These products include cigarettes, chewing tobacco, and vaping devices, such as e-cigarettes. If you need help quitting, ask your health care provider. Ask your health care provider: How your surgery site will be marked. What steps will be taken to help prevent infection. These may include: Washing skin with a germ-killing soap. Taking antibiotic medicine. If you will be going home right after the procedure, plan to have a responsible adult: Take you home from the hospital or clinic. You will not be allowed to drive. Care for you for the time you are told. What happens during   the procedure?  An IV will be inserted into one of your veins. You will be given one or more of the following: A medicine to help you relax (sedative). A medicine to numb the area (local anesthetic). A medicine to make you fall asleep (general  anesthetic). A medicine that is injected into an area of your body to numb everything below the injection site (regional anesthetic). An incision will be made into the skin over the lipoma or very near the lipoma. The incision may be made in a natural skin line or crease. Tissues, nerves, and blood vessels near the lipoma will be moved out of the way. The lipoma and the capsule that surrounds it will be separated from the surrounding tissues. The lipoma will be removed. The incision may be closed with stitches (sutures). A bandage (dressing) will be placed over the incision. The procedure may vary among health care providers and hospitals. What happens after the procedure? Your blood pressure, heart rate, breathing rate, and blood oxygen level will be monitored until you leave the hospital or clinic. If you were prescribed an antibiotic medicine, use it as told by your health care provider. Do not stop using the antibiotic even if you start to feel better. If you were given a sedative during the procedure, it can affect you for several hours. Do not drive or operate machinery until your health care provider says that it is safe. Where to find more information OrthoInfo: orthoinfo.aaos.org Summary Before the procedure, follow instructions from your health care provider about eating and drinking, and changing or stopping your regular medicines. This is especially important if you are taking diabetes medicines or blood thinners. After the lipoma is removed, the incision may be closed with stitches (sutures) and covered with a bandage (dressing). If you were given a sedative during the procedure, it can affect you for several hours. Do not drive or operate machinery until your health care provider says that it is safe. This information is not intended to replace advice given to you by your health care provider. Make sure you discuss any questions you have with your health care provider. Document  Revised: 08/23/2021 Document Reviewed: 08/23/2021 Elsevier Patient Education  2023 Elsevier Inc.  

## 2021-12-19 ENCOUNTER — Encounter: Payer: Self-pay | Admitting: Surgery

## 2021-12-19 ENCOUNTER — Telehealth: Payer: Self-pay | Admitting: Surgery

## 2021-12-19 NOTE — Progress Notes (Addendum)
Outpatient Surgical Follow Up ? ?12/19/2021 ? ?Krystal Blackwell is an 41 y.o. female.  ? ?Chief Complaint  ?Patient presents with  ? Follow-up  ?  Lipoma left arm  ? ? ?HPI: Krystal Blackwell is a 41 year old female well-known to me with history of left upper arm soft tissue mass.  She did have a CT scan that I personally reviewed showing evidence of a large intramuscular lipoma and some atypical fissures.  I also discussed with Dr. Posey Pronto from radiology whether or not an MRI will be helpful.  He really thinks is a lipoma but cannot rule out a sarcomatous. There is tiny nodule on medial aspect of lesion. ? ?Past Medical History:  ?Diagnosis Date  ? Blood transfusion without reported diagnosis   ? [id number]  ? Depression   ? Diverticulitis   ? Diverticulosis   ? Ovarian cyst   ? Pelvic pain in female   ? Recurrent sinus infections   ? Scoliosis   ? ? ?Past Surgical History:  ?Procedure Laterality Date  ? NASAL SEPTUM SURGERY  11/2013  ? SPINE SURGERY  97,98,99,00  ? scoliosis  ? ? ?Family History  ?Problem Relation Age of Onset  ? Asthma Mother   ? Hypertension Mother   ? Miscarriages / [place] Mother   ? Alcohol abuse Father   ? Drug abuse Father   ? Pancreatic cancer Father   ? Arthritis Maternal Grandmother   ? Colon cancer Neg Hx   ? Stomach cancer Neg Hx   ? Esophageal cancer Neg Hx   ? Rectal cancer Neg Hx   ? Liver cancer Neg Hx   ? ? ?Social History:  reports that she quit smoking about 16 months ago. Her smoking use included cigarettes. She has never used smokeless tobacco. She reports that she does not currently use alcohol after a past usage of about 6.0 standard drinks per week. She reports that she does not use drugs. ? ?Allergies:  ?Allergies  ?Allergen Reactions  ? Avelox [Moxifloxacin Hcl In Nacl] Swelling  ? Abilify [Aripiprazole] Swelling  ?  Made jaw twitch  ? ? ?Medications reviewed. ? ? ? ?ROS ?Full ROS performed and is otherwise negative other than what is stated in HPI ? ? ?BP (!) 143/94   Pulse 93    Temp 98.3 ?F (36.8 ?C) (Oral)   Wt 174 lb 3.2 oz (79 kg)   SpO2 99%   BMI 32.91 kg/m?  ? ?Physical Exam ?CONSTITUTIONAL: NAD. ?EYES: Pupils are equal, round, Sclera are non-icteric. ?EARS, NOSE, MOUTH AND THROAT:  The oral mucosa is pink and moist. Hearing is intact to voice. ?LYMPH NODES:  Lymph nodes in the neck are normal. ?RESPIRATORY:  Lungs are clear. There is normal respiratory effort, with equal breath sounds bilaterally, and without pathologic use of accessory muscles. ?CARDIOVASCULAR: Heart is regular without murmurs, gallops, or rubs. ?GI: The abdomen is  soft, nontender, and nondistended. There are no palpable masses. There is no hepatosplenomegaly. There are normal bowel sounds in all quadrants. ?GU: Rectal deferred.   ?MUSCULOSKELETAL: Normal muscle strength and tone. No cyanosis or edema.   ?SKIN: There is a large lipoma on the left upper arm measuring 10 x 8 cm mobile, no solid component. Soft. ?NEUROLOGIC: Motor and sensation is grossly normal. Cranial nerves are grossly intact. ?PSYCH:  Oriented to person, place and time. Affect is normal. ?  ?Data Reviewed ?  ?I have personally reviewed the patient's imaging, laboratory findings and medical records.   ?  ? ? ? ?  Assessment/Plan: ?41 year old female with large soft tissue mass on the left arm consistent with lipoma vs low grade atypical .  I had a good discussion with her regarding her disease process.  Options of close follow-up with another imaging in 6 months versus referral to a tertiary center and a sarcoma team versus excision here in the OR were discussed with her in detail. ?All the risk and benefits of those decisions were discussed with the patient.  She wishes to have this excised here locally. ?We will go ahead and post her for excision under anesthesia.  Procedure discussed with the patient in detail.  Risks, benefits and possible complications including but not limited to: Bleeding, infection positivity of margins, potential  requirement for reexcision and transfer to a sarcoma center.  He understands and wishes to proceed ? ?Please note that I spent 40 minutes in this encounter including personally reviewing her imaging studies, coordinating her care, placing orders, counseling the patient and performing appropriate documentation ?Caroleen Hamman, MD FACS ?General Surgeon  ?

## 2021-12-19 NOTE — Telephone Encounter (Signed)
Left message for patient to call, please inform her of the following regarding scheduled surgery:  ? ?Pre-Admission date/time, COVID Testing date and Surgery date. ? ?Surgery Date: 01/30/22 ?Preadmission Testing Date: 01/22/22 (phone 8a-1p) ?Covid Testing Date: Not needed.   ? ?Also she will need to call at [phone], between 1-3:00pm the day before surgery, to find out what time to arrive for surgery.   ? ?

## 2021-12-20 NOTE — Telephone Encounter (Signed)
Tried calling patient again, mailbox is full, can't leave a message.  If patient calls, please inform her of the following regarding her scheduled surgery.  ?

## 2021-12-23 NOTE — Telephone Encounter (Signed)
Outgoing call is made again, patient is now aware of all dates regarding her surgery and verbalized understanding.  

## 2021-12-27 ENCOUNTER — Telehealth: Payer: Self-pay | Admitting: Obstetrics and Gynecology

## 2021-12-27 NOTE — Telephone Encounter (Signed)
It usually takes a few days for a blood pressure medication dose change to show effects on her blood pressure. She should continue taking the medication, increase her fluids.  If her symptoms continue over the next 24 hours, she should consider f/u at urgent care or ER.

## 2021-12-27 NOTE — Telephone Encounter (Signed)
Pt called to make provider aware that she has been experiencing high blood pressure, running around 140/100. Pt is currently on blood pressure medication she normally takes half a pill but even took a whole pill yesterday and still does have high blood pressure. Pt states experiencing dizziness and pressure in neck - stiffness. Please advise.  ?

## 2021-12-30 ENCOUNTER — Telehealth: Payer: Self-pay | Admitting: Obstetrics and Gynecology

## 2021-12-30 ENCOUNTER — Other Ambulatory Visit: Payer: Self-pay

## 2021-12-30 MED ORDER — DULOXETINE HCL 20 MG PO CPEP: 60.0000 mg | ORAL_CAPSULE | Freq: Every day | ORAL | 0 refills | Status: DC

## 2021-12-30 NOTE — Telephone Encounter (Signed)
Pt is asking for a Cymbalta refill. Pt has an upcoming appt on 05/17  ?

## 2021-12-30 NOTE — Telephone Encounter (Signed)
Called patient she said her blood pressure continues to be elevated 150's/100's. She wanted to know if she should be taking 12.5 or the 25 mg Microzide. She has an appointment this week. ?

## 2021-12-30 NOTE — Progress Notes (Deleted)
    GYNECOLOGY PROGRESS NOTE  Subjective:    Patient ID: JOLANTA CABEZA, female    DOB: [birth year], 41 y.o.   MRN: [id number]  HPI  Patient is a 41 y.o. [id number] female who presents for postpartum depression, medication refills and elevated blood pressure.  {Common ambulatory SmartLinks:19316}  Review of Systems {ros; complete:30496}   Objective:   currently breastfeeding. There is no height or weight on file to calculate BMI. General appearance: {general exam:16600} Abdomen: {abdominal exam:16834} Pelvic: {pelvic exam:16852::"cervix normal in appearance","external genitalia normal","no adnexal masses or tenderness","no cervical motion tenderness","rectovaginal septum normal","uterus normal size, shape, and consistency","vagina normal without discharge"} Extremities: {extremity exam:5109} Neurologic: {neuro exam:17854}   Assessment:   No diagnosis found.   Plan:   There are no diagnoses linked to this encounter.     Rubie Maid, MD [facility]

## 2022-01-01 ENCOUNTER — Encounter: Payer: BC Managed Care – PPO | Admitting: Obstetrics and Gynecology

## 2022-01-01 NOTE — Telephone Encounter (Signed)
Patient has office visit today will advise increasing blood pressure meds to 25 mg when she come in. ?

## 2022-01-03 ENCOUNTER — Encounter: Payer: Self-pay | Admitting: Obstetrics and Gynecology

## 2022-01-03 ENCOUNTER — Ambulatory Visit (INDEPENDENT_AMBULATORY_CARE_PROVIDER_SITE_OTHER): Payer: BC Managed Care – PPO | Admitting: Obstetrics and Gynecology

## 2022-01-03 VITALS — BP 118/80 | HR 71

## 2022-01-03 DIAGNOSIS — I10 Essential (primary) hypertension: Secondary | ICD-10-CM | POA: Diagnosis not present

## 2022-01-03 NOTE — Progress Notes (Signed)
Pt presents for f/u BP check. BP 119/80 P 71. Pt reports no symptoms since restarting BP meds last Saturday. Pt agrees to follow up with any new concerning symptoms. All questions answered.

## 2022-01-20 ENCOUNTER — Emergency Department
Admission: EM | Admit: 2022-01-20 | Discharge: 2022-01-20 | Disposition: A | Discharge status: Home / Self Care | Payer: BC Managed Care – PPO | Attending: Emergency Medicine | Admitting: Emergency Medicine

## 2022-01-20 ENCOUNTER — Encounter: Payer: Self-pay | Admitting: Emergency Medicine

## 2022-01-20 ENCOUNTER — Other Ambulatory Visit: Payer: Self-pay

## 2022-01-20 DIAGNOSIS — K5732 Diverticulitis of large intestine without perforation or abscess without bleeding: Secondary | ICD-10-CM | POA: Insufficient documentation

## 2022-01-20 DIAGNOSIS — K5792 Diverticulitis of intestine, part unspecified, without perforation or abscess without bleeding: Secondary | ICD-10-CM

## 2022-01-20 DIAGNOSIS — R1032 Left lower quadrant pain: Secondary | ICD-10-CM | POA: Diagnosis present

## 2022-01-20 LAB — COMPREHENSIVE METABOLIC PANEL
ALT: 15 U/L (ref 0–44)
AST: 16 U/L (ref 15–41)
Albumin: 4.1 g/dL (ref 3.5–5.0)
Alkaline Phosphatase: 70 U/L (ref 38–126)
Anion gap: 7 (ref 5–15)
BUN: 9 mg/dL (ref 6–20)
CO2: 23 mmol/L (ref 22–32)
Calcium: 9.5 mg/dL (ref 8.9–10.3)
Chloride: 105 mmol/L (ref 98–111)
Creatinine, Ser: 0.74 mg/dL (ref 0.44–1.00)
GFR, Estimated: >60 mL/min (ref >60)
Glucose, Bld: 123 mg/dL — ABNORMAL HIGH (ref 70–99)
Potassium: 4 mmol/L (ref 3.5–5.1)
Sodium: 135 mmol/L (ref 135–145)
Total Bilirubin: 0.5 mg/dL (ref 0.3–1.2)
Total Protein: 7.9 g/dL (ref 6.5–8.1)

## 2022-01-20 LAB — URINALYSIS, ROUTINE W REFLEX MICROSCOPIC
Bilirubin Urine: NEGATIVE
Glucose, UA: NEGATIVE mg/dL
Ketones, ur: NEGATIVE mg/dL
Nitrite: NEGATIVE
Protein, ur: NEGATIVE mg/dL
Specific Gravity, Urine: 1.019 (ref 1.005–1.030)
pH: 5 (ref 5.0–8.0)

## 2022-01-20 LAB — CBC
HCT: 39.4 % (ref 36.0–46.0)
Hemoglobin: 12.9 g/dL (ref 12.0–15.0)
MCH: 30.9 pg (ref 26.0–34.0)
MCHC: 32.7 g/dL (ref 30.0–36.0)
MCV: 94.3 fL (ref 80.0–100.0)
Platelets: 393 10*3/uL (ref 150–400)
RBC: 4.18 MIL/uL (ref 3.87–5.11)
RDW: 12.3 % (ref 11.5–15.5)
WBC: 9.8 10*3/uL (ref 4.0–10.5)
nRBC: 0 % (ref 0.0–0.2)

## 2022-01-20 LAB — POC URINE PREG, ED: Preg Test, Ur: NEGATIVE

## 2022-01-20 LAB — LIPASE, BLOOD: Lipase: 29 U/L (ref 11–51)

## 2022-01-20 MED ORDER — AMOXICILLIN-POT CLAVULANATE 875-125 MG PO TABS: 1.0000 | ORAL_TABLET | Freq: Two times a day (BID) | ORAL | 0 refills | Status: AC

## 2022-01-20 NOTE — ED Notes (Signed)
See triage note  presents with lower abd pain  states she has a hx of diverticulitis and sx's feel the same

## 2022-01-20 NOTE — ED Provider Notes (Signed)
[facility] Provider Note    Event Date/Time   First MD Initiated Contact with Patient 01/20/22 [phone]     (approximate)   History   Abdominal Pain   HPI  Krystal Blackwell is a 41 y.o. female with a history of diverticulitis presents with complaints of left lower quadrant pain.  Patient describes pain started 3 days ago and is consistent with her diverticulitis pain that she has had before.  She did take leftover Flagyl but did not have any Cipro, symptoms were improving but then worsened again last night.  No fevers or chills.  Normal stools     Physical Exam   Triage Vital Signs: ED Triage Vitals  Enc Vitals Group     BP 01/20/22 0851 (!) 116/102     Pulse Rate 01/20/22 0851 (!) 112     Resp 01/20/22 0851 20     Temp 01/20/22 0851 97.6 F (36.4 C)     Temp Source 01/20/22 0851 Oral     SpO2 01/20/22 0851 100 %     Weight 01/20/22 0852 79.4 kg (175 lb)     Height 01/20/22 0852 1.549 m ('5\' 1"'$ )     Head Circumference --      Peak Flow --      Pain Score 01/20/22 0852 8     Pain Loc --      Pain Edu? --      Excl. in [place]? --     Most recent vital signs: Vitals:   01/20/22 0851 01/20/22 0954  BP: (!) 116/102 (!) 120/99  Pulse: (!) 112 100  Resp: 20 18  Temp: 97.6 F (36.4 C)   SpO2: 100% 100%     General: Awake, no distress.  CV:  Good peripheral perfusion.  Resp:  Normal effort.  Abd:  No distention.  Mild tenderness palpation left lower quadrant, no CVA tenderness Other:     ED Results / Procedures / Treatments   Labs (all labs ordered are listed, but only abnormal results are displayed) Labs Reviewed  COMPREHENSIVE METABOLIC PANEL - Abnormal; Notable for the following components:      Result Value   Glucose, Bld 123 (*)    All other components within normal limits  URINALYSIS, ROUTINE W REFLEX MICROSCOPIC - Abnormal; Notable for the following components:   Color, Urine YELLOW (*)    APPearance HAZY (*)    Hgb urine  dipstick SMALL (*)    Leukocytes,Ua SMALL (*)    Bacteria, UA RARE (*)    All other components within normal limits  CBC  LIPASE, BLOOD  POC URINE PREG, ED     EKG     RADIOLOGY     PROCEDURES:  Critical Care performed:   Procedures   MEDICATIONS ORDERED IN ED: Medications - No data to display   IMPRESSION / MDM / [place] / ED COURSE  I reviewed the triage vital signs and the nursing notes. Patient's presentation is most consistent with acute presentation with potential threat to life or bodily function.   Patient with a history of diverticulitis presents with left lower quadrant abdominal pain  Differential includes diverticulitis, UTI, pyelo-, ureterolithiasis  She has tenderness in that area most consistent with diverticulitis, no CVA tenderness, no dysuria to suggest UTI, location of pain not consistent with ureterolithiasis  She feels this is consistent with diverticulitis and I am inclined to agree.  I will start the patient on Augmentin, she knows to  return if worsening symptoms for IV antibiotic       FINAL CLINICAL IMPRESSION(S) / ED DIAGNOSES   Final diagnoses:  Diverticulitis     Rx / DC Orders   ED Discharge Orders          Ordered    amoxicillin-clavulanate (AUGMENTIN) 875-125 MG tablet  2 times daily        01/20/22 1003             Note:  This document was prepared using Dragon voice recognition software and may include unintentional dictation errors.   Lavonia Drafts, MD 01/20/22 1524

## 2022-01-20 NOTE — ED Triage Notes (Signed)
Pt in with co left sided abd pain since Saturday, pt has hx of diverticulitis states feels the same.

## 2022-01-22 ENCOUNTER — Other Ambulatory Visit: Payer: Self-pay

## 2022-01-22 ENCOUNTER — Encounter
Admission: RE | Admit: 2022-01-22 | Discharge: 2022-01-22 | Disposition: A | Discharge status: Home / Self Care | Payer: BC Managed Care – PPO | Source: Ambulatory Visit | Attending: Surgery | Admitting: Surgery

## 2022-01-22 DIAGNOSIS — Z01818 Encounter for other preprocedural examination: Secondary | ICD-10-CM

## 2022-01-22 DIAGNOSIS — I10 Essential (primary) hypertension: Secondary | ICD-10-CM

## 2022-01-22 HISTORY — DX: Anxiety disorder, unspecified: F41.9

## 2022-01-22 NOTE — Patient Instructions (Addendum)
Please review these instructions CAREFULLY. Please attend to details.   Your procedure is scheduled on: 01/30/2022  Report to the Registration Desk on the 1st floor of the [facility]. To find out your arrival time, please call [phone] between 1PM - 3PM on: 01/29/2022  If your arrival time is 6:00 am, do not arrive prior to that time as the [facility] entrance doors do not open until 6:00 am.  REMEMBER: Instructions that are not followed completely may result in serious medical risk, up to and including death; or upon the discretion of your surgeon and anesthesiologist your surgery may need to be rescheduled.  Do not eat food after midnight the night before surgery.  No gum chewing, lozengers or hard candies.  You may however, drink CLEAR liquids up to 2 hours before you are scheduled to arrive for your surgery. Do not drink anything within 2 hours of your scheduled arrival time.  Clear liquids include: - water  - apple juice without pulp - gatorade (not RED colors) Do NOT drink anything that is not on this list.    TAKE THESE MEDICATIONS THE MORNING OF SURGERY WITH A SIP OF WATER: Cymbalta   One week prior to surgery: Stop Anti-inflammatories (NSAIDS) such as Advil, Aleve, Ibuprofen, Motrin, Naproxen, Naprosyn and Aspirin based products such as Excedrin, Goodys Powder, BC Powder.  Stop ANY OVER THE COUNTER supplements until after surgery. You may however, continue to take Tylenol if needed for pain up until the day of surgery.  No Alcohol for 24 hours before or after surgery.  No Smoking including e-cigarettes for 24 hours prior to surgery.  No chewable tobacco products for at least 6 hours prior to surgery.  No nicotine patches on the day of surgery.  Do not use any "recreational" drugs for at least a week prior to your surgery.  Please be advised that the combination of cocaine and anesthesia may have negative outcomes, up to and including death. If you test  positive for cocaine, your surgery will be cancelled.  On the morning of surgery brush your teeth with toothpaste and water, you may rinse your mouth with mouthwash if you wish. Do not swallow any toothpaste or mouthwash.  Use CHG Soap with shower as directed on instruction sheet.  Do not wear jewelry, make-up, hairpins, clips or nail polish.  Do not wear lotions, powders, creams , ointments perfumes or deodorant.  Do not shave body from the neck down 48 hours prior to surgery just in case you cut yourself which could leave a site for infection.  Also, freshly shaved skin may become irritated if using the CHG soap.  Contact lenses, hearing aids and dentures may not be worn into surgery.  Do not bring valuables to the hospital. [facility] is not responsible for any missing/lost belongings or valuables.    Notify your doctor if there is any change in your medical condition (cold, fever, infection).  Wear comfortable clothing (specific to your surgery type) to the hospital.  After surgery, you can help prevent lung complications by doing breathing exercises.  Take deep breaths and cough every 1-2 hours. Your doctor may order a device called an Incentive Spirometer to help you take deep breaths.  If you are being admitted to the hospital overnight, leave your suitcase in the car. After surgery it may be brought to your room.  If you are being discharged the day of surgery, you will not be allowed to drive home. You will need  a responsible adult (18 years or older) to drive you home and stay with you that night.   If you are taking public transportation, you will need to have a responsible adult (18 years or older) with you. Please confirm with your physician that it is acceptable to use public transportation.   Please call the [facility] at [phone] if you have any questions about these instructions.  Surgery Visitation Policy:  Patients undergoing a  surgery or procedure may have two family members or support persons with them as long as the person is not COVID-19 positive or experiencing its symptoms.

## 2022-01-24 ENCOUNTER — Other Ambulatory Visit: Payer: Self-pay | Admitting: Obstetrics and Gynecology

## 2022-01-27 ENCOUNTER — Inpatient Hospital Stay: Admission: RE | Admit: 2022-01-27 | Payer: BC Managed Care – PPO | Source: Ambulatory Visit

## 2022-01-28 ENCOUNTER — Inpatient Hospital Stay: Admission: RE | Admit: 2022-01-28 | Payer: BC Managed Care – PPO | Source: Ambulatory Visit

## 2022-01-29 ENCOUNTER — Encounter
Admission: RE | Admit: 2022-01-29 | Discharge: 2022-01-29 | Disposition: A | Discharge status: Home / Self Care | Payer: BC Managed Care – PPO | Source: Ambulatory Visit | Attending: Surgery | Admitting: Surgery

## 2022-01-29 DIAGNOSIS — Z87891 Personal history of nicotine dependence: Secondary | ICD-10-CM | POA: Diagnosis not present

## 2022-01-29 DIAGNOSIS — K5792 Diverticulitis of intestine, part unspecified, without perforation or abscess without bleeding: Secondary | ICD-10-CM | POA: Diagnosis not present

## 2022-01-29 DIAGNOSIS — E669 Obesity, unspecified: Secondary | ICD-10-CM | POA: Diagnosis not present

## 2022-01-29 DIAGNOSIS — Z01810 Encounter for preprocedural cardiovascular examination: Secondary | ICD-10-CM | POA: Insufficient documentation

## 2022-01-29 DIAGNOSIS — R2232 Localized swelling, mass and lump, left upper limb: Secondary | ICD-10-CM | POA: Diagnosis present

## 2022-01-29 DIAGNOSIS — I10 Essential (primary) hypertension: Secondary | ICD-10-CM

## 2022-01-29 DIAGNOSIS — D179 Benign lipomatous neoplasm, unspecified: Secondary | ICD-10-CM | POA: Diagnosis not present

## 2022-01-29 NOTE — Anesthesia Preprocedure Evaluation (Addendum)
Anesthesia Evaluation  Patient identified by MRN, date of birth, ID band Patient awake    Reviewed: Allergy & Precautions, NPO status , Patient's Chart, lab work & pertinent test results  Airway Mallampati: III  TM Distance: >3 FB Neck ROM: full    Dental no notable dental hx.    Pulmonary neg pulmonary ROS, former smoker,    Pulmonary exam normal        Cardiovascular hypertension, Pt. on medications Normal cardiovascular exam     Neuro/Psych PSYCHIATRIC DISORDERS Anxiety Depression Bipolar Disorder negative neurological ROS     GI/Hepatic Neg liver ROS, H/o  Diverticulitis with recent ED visit for abdominal pain for which antibiotics were prescribed to treat diverticulitis   Endo/Other  negative endocrine ROS  Renal/GU      Musculoskeletal   Abdominal (+) + obese,   Peds  Hematology negative hematology ROS (+)   Anesthesia Other Findings Past Medical History: No date: Anxiety No date: Blood transfusion without reported diagnosis     Comment:  0630,1601 No date: Depression No date: Diverticulitis No date: Diverticulosis No date: Ovarian cyst No date: Pelvic pain in female No date: Recurrent sinus infections No date: Scoliosis  Past Surgical History: 11/2013: NASAL SEPTUM SURGERY 97,98,99,00: SPINE SURGERY     Comment:  scoliosis     Reproductive/Obstetrics negative OB ROS                            Anesthesia Physical Anesthesia Plan  ASA: 2  Anesthesia Plan: General LMA   Post-op Pain Management: Gabapentin PO (pre-op)*, Celebrex PO (pre-op)* and Tylenol PO (pre-op)*   Induction: Intravenous  PONV Risk Score and Plan: 3 and Dexamethasone, Ondansetron and Midazolam  Airway Management Planned: LMA  Additional Equipment:   Intra-op Plan:   Post-operative Plan: Extubation in OR  Informed Consent: I have reviewed the patients History and Physical, chart, labs and  discussed the procedure including the risks, benefits and alternatives for the proposed anesthesia with the patient or authorized representative who has indicated his/her understanding and acceptance.     Dental Advisory Given  Plan Discussed with: Anesthesiologist, CRNA and Surgeon  Anesthesia Plan Comments:        Anesthesia Quick Evaluation

## 2022-01-30 ENCOUNTER — Other Ambulatory Visit: Payer: Self-pay

## 2022-01-30 ENCOUNTER — Ambulatory Visit: Payer: BC Managed Care – PPO | Admitting: Anesthesiology

## 2022-01-30 ENCOUNTER — Ambulatory Visit
Admission: RE | Admit: 2022-01-30 | Discharge: 2022-01-30 | Disposition: A | Discharge status: Home / Self Care | Payer: BC Managed Care – PPO | Attending: Surgery | Admitting: Surgery

## 2022-01-30 ENCOUNTER — Encounter
Admission: RE | Disposition: A | Discharge status: Home / Self Care | Payer: Self-pay | Source: Home / Self Care | Attending: Surgery

## 2022-01-30 ENCOUNTER — Encounter: Payer: Self-pay | Admitting: Surgery

## 2022-01-30 DIAGNOSIS — E669 Obesity, unspecified: Secondary | ICD-10-CM | POA: Insufficient documentation

## 2022-01-30 DIAGNOSIS — K5792 Diverticulitis of intestine, part unspecified, without perforation or abscess without bleeding: Secondary | ICD-10-CM | POA: Insufficient documentation

## 2022-01-30 DIAGNOSIS — Z87891 Personal history of nicotine dependence: Secondary | ICD-10-CM | POA: Insufficient documentation

## 2022-01-30 DIAGNOSIS — D1722 Benign lipomatous neoplasm of skin and subcutaneous tissue of left arm: Secondary | ICD-10-CM | POA: Diagnosis not present

## 2022-01-30 DIAGNOSIS — Z01818 Encounter for other preprocedural examination: Secondary | ICD-10-CM

## 2022-01-30 DIAGNOSIS — D179 Benign lipomatous neoplasm, unspecified: Secondary | ICD-10-CM | POA: Insufficient documentation

## 2022-01-30 DIAGNOSIS — I10 Essential (primary) hypertension: Secondary | ICD-10-CM | POA: Insufficient documentation

## 2022-01-30 DIAGNOSIS — M7989 Other specified soft tissue disorders: Secondary | ICD-10-CM

## 2022-01-30 LAB — POCT PREGNANCY, URINE: Preg Test, Ur: NEGATIVE

## 2022-01-30 SURGERY — EXCISION, MASS, UPPER EXTREMITY
Anesthesia: General | Laterality: Left

## 2022-01-30 MED ORDER — PROMETHAZINE HCL 25 MG/ML IJ SOLN: 6.2500 mg | INTRAMUSCULAR | Status: DC | PRN

## 2022-01-30 MED ORDER — CHLORHEXIDINE GLUCONATE CLOTH 2 % EX PADS
6.0000 | MEDICATED_PAD | Freq: Once | CUTANEOUS | Status: AC
  Administered 2022-01-30: 6 via TOPICAL

## 2022-01-30 MED ORDER — FAMOTIDINE 20 MG PO TABS: 20.0000 mg | ORAL_TABLET | Freq: Once | ORAL | Status: AC

## 2022-01-30 MED ORDER — GABAPENTIN 300 MG PO CAPS
ORAL_CAPSULE | ORAL | Status: AC
  Administered 2022-01-30: 300 mg via ORAL
  Filled 2022-01-30: qty 1

## 2022-01-30 MED ORDER — FENTANYL CITRATE (PF) 100 MCG/2ML IJ SOLN: 25.0000 ug | INTRAMUSCULAR | Status: DC | PRN

## 2022-01-30 MED ORDER — ACETAMINOPHEN 500 MG PO TABS: 1000.0000 mg | ORAL_TABLET | ORAL | Status: AC

## 2022-01-30 MED ORDER — PROPOFOL 10 MG/ML IV BOLUS
INTRAVENOUS | Status: AC
  Filled 2022-01-30: qty 20

## 2022-01-30 MED ORDER — CHLORHEXIDINE GLUCONATE 0.12 % MT SOLN: 15.0000 mL | Freq: Once | OROMUCOSAL | Status: AC

## 2022-01-30 MED ORDER — DEXAMETHASONE SODIUM PHOSPHATE 10 MG/ML IJ SOLN
INTRAMUSCULAR | Status: DC | PRN
  Administered 2022-01-30: 10 mg via INTRAVENOUS

## 2022-01-30 MED ORDER — ORAL CARE MOUTH RINSE: 15.0000 mL | Freq: Once | OROMUCOSAL | Status: AC

## 2022-01-30 MED ORDER — CHLORHEXIDINE GLUCONATE CLOTH 2 % EX PADS: 6.0000 | MEDICATED_PAD | Freq: Once | CUTANEOUS | Status: DC

## 2022-01-30 MED ORDER — CHLORHEXIDINE GLUCONATE 0.12 % MT SOLN
OROMUCOSAL | Status: AC
  Administered 2022-01-30: 15 mL via OROMUCOSAL
  Filled 2022-01-30: qty 15

## 2022-01-30 MED ORDER — LIDOCAINE HCL (PF) 2 % IJ SOLN
INTRAMUSCULAR | Status: AC
  Filled 2022-01-30: qty 5

## 2022-01-30 MED ORDER — CELECOXIB 200 MG PO CAPS
ORAL_CAPSULE | ORAL | Status: AC
  Administered 2022-01-30: 200 mg via ORAL
  Filled 2022-01-30: qty 1

## 2022-01-30 MED ORDER — FAMOTIDINE 20 MG PO TABS
ORAL_TABLET | ORAL | Status: AC
  Administered 2022-01-30: 20 mg via ORAL
  Filled 2022-01-30: qty 1

## 2022-01-30 MED ORDER — BUPIVACAINE-EPINEPHRINE (PF) 0.25% -1:200000 IJ SOLN
INTRAMUSCULAR | Status: DC | PRN
  Administered 2022-01-30: 30 mL

## 2022-01-30 MED ORDER — ONDANSETRON HCL 4 MG/2ML IJ SOLN
INTRAMUSCULAR | Status: DC | PRN
  Administered 2022-01-30: 4 mg via INTRAVENOUS

## 2022-01-30 MED ORDER — FENTANYL CITRATE (PF) 100 MCG/2ML IJ SOLN
INTRAMUSCULAR | Status: AC
  Filled 2022-01-30: qty 2

## 2022-01-30 MED ORDER — HYDROCODONE-ACETAMINOPHEN 5-325 MG PO TABS: 1.0000 | ORAL_TABLET | Freq: Four times a day (QID) | ORAL | 0 refills | Status: AC | PRN

## 2022-01-30 MED ORDER — CELECOXIB 200 MG PO CAPS: 200.0000 mg | ORAL_CAPSULE | ORAL | Status: AC

## 2022-01-30 MED ORDER — ACETAMINOPHEN 10 MG/ML IV SOLN: 1000.0000 mg | Freq: Once | INTRAVENOUS | Status: DC | PRN

## 2022-01-30 MED ORDER — DEXAMETHASONE SODIUM PHOSPHATE 10 MG/ML IJ SOLN
INTRAMUSCULAR | Status: AC
  Filled 2022-01-30: qty 1

## 2022-01-30 MED ORDER — OXYCODONE HCL 5 MG/5ML PO SOLN: 5.0000 mg | Freq: Once | ORAL | Status: DC | PRN

## 2022-01-30 MED ORDER — MIDAZOLAM HCL 2 MG/2ML IJ SOLN
INTRAMUSCULAR | Status: AC
  Filled 2022-01-30: qty 2

## 2022-01-30 MED ORDER — ONDANSETRON HCL 4 MG/2ML IJ SOLN
INTRAMUSCULAR | Status: AC
  Filled 2022-01-30: qty 2

## 2022-01-30 MED ORDER — LIDOCAINE HCL (CARDIAC) PF 100 MG/5ML IV SOSY
PREFILLED_SYRINGE | INTRAVENOUS | Status: DC | PRN
  Administered 2022-01-30: 80 mg via INTRAVENOUS

## 2022-01-30 MED ORDER — CEFAZOLIN SODIUM-DEXTROSE 2-4 GM/100ML-% IV SOLN
2.0000 g | INTRAVENOUS | Status: AC
  Administered 2022-01-30: 2 g via INTRAVENOUS

## 2022-01-30 MED ORDER — PROPOFOL 10 MG/ML IV BOLUS
INTRAVENOUS | Status: DC | PRN
  Administered 2022-01-30: 150 mg via INTRAVENOUS

## 2022-01-30 MED ORDER — ACETAMINOPHEN 500 MG PO TABS
ORAL_TABLET | ORAL | Status: AC
  Administered 2022-01-30: 1000 mg via ORAL
  Filled 2022-01-30: qty 2

## 2022-01-30 MED ORDER — MIDAZOLAM HCL 2 MG/2ML IJ SOLN
INTRAMUSCULAR | Status: DC | PRN
  Administered 2022-01-30: 2 mg via INTRAVENOUS

## 2022-01-30 MED ORDER — DROPERIDOL 2.5 MG/ML IJ SOLN: 0.6250 mg | Freq: Once | INTRAMUSCULAR | Status: DC | PRN

## 2022-01-30 MED ORDER — CEFAZOLIN SODIUM-DEXTROSE 2-4 GM/100ML-% IV SOLN
INTRAVENOUS | Status: AC
  Filled 2022-01-30: qty 100

## 2022-01-30 MED ORDER — OXYCODONE HCL 5 MG PO TABS: 5.0000 mg | ORAL_TABLET | Freq: Once | ORAL | Status: DC | PRN

## 2022-01-30 MED ORDER — BUPIVACAINE LIPOSOME 1.3 % IJ SUSP
INTRAMUSCULAR | Status: AC
Start: 2022-01-30 — End: ?
  Filled 2022-01-30: qty 20

## 2022-01-30 MED ORDER — BUPIVACAINE LIPOSOME 1.3 % IJ SUSP
INTRAMUSCULAR | Status: DC | PRN
  Administered 2022-01-30: 20 mL

## 2022-01-30 MED ORDER — BUPIVACAINE-EPINEPHRINE (PF) 0.25% -1:200000 IJ SOLN
INTRAMUSCULAR | Status: AC
  Filled 2022-01-30: qty 30

## 2022-01-30 MED ORDER — GABAPENTIN 300 MG PO CAPS: 300.0000 mg | ORAL_CAPSULE | ORAL | Status: AC

## 2022-01-30 MED ORDER — LACTATED RINGERS IV SOLN: INTRAVENOUS | Status: DC

## 2022-01-30 MED ORDER — FENTANYL CITRATE (PF) 100 MCG/2ML IJ SOLN
INTRAMUSCULAR | Status: DC | PRN
Start: 2022-01-30 — End: 2022-01-30
  Administered 2022-01-30 (×2): 25 ug via INTRAVENOUS

## 2022-01-30 SURGICAL SUPPLY — 29 items
ADH SKN CLS APL DERMABOND .7 (GAUZE/BANDAGES/DRESSINGS) ×1
APL PRP STRL LF DISP 70% ISPRP (MISCELLANEOUS) ×1
BLADE SURG 15 STRL LF DISP TIS (BLADE) ×1 IMPLANT
BLADE SURG 15 STRL SS (BLADE) ×2
CHLORAPREP W/TINT 26 (MISCELLANEOUS) ×2 IMPLANT
DERMABOND ADVANCED (GAUZE/BANDAGES/DRESSINGS) ×1
DERMABOND ADVANCED .7 DNX12 (GAUZE/BANDAGES/DRESSINGS) ×1 IMPLANT
DRAPE LAPAROTOMY 100X77 ABD (DRAPES) ×2 IMPLANT
ELECT REM PT RETURN 9FT ADLT (ELECTROSURGICAL) ×2
ELECTRODE REM PT RTRN 9FT ADLT (ELECTROSURGICAL) ×1 IMPLANT
GAUZE 4X4 16PLY ~~LOC~~+RFID DBL (SPONGE) ×1 IMPLANT
GLOVE BIO SURGEON STRL SZ7 (GLOVE) ×4 IMPLANT
GOWN STRL REUS W/ TWL LRG LVL3 (GOWN DISPOSABLE) ×2 IMPLANT
GOWN STRL REUS W/TWL LRG LVL3 (GOWN DISPOSABLE) ×4
KIT MARKER MARGIN INK (KITS) ×1 IMPLANT
KIT TURNOVER KIT A (KITS) ×2 IMPLANT
LABEL OR SOLS (LABEL) ×2 IMPLANT
MANIFOLD NEPTUNE II (INSTRUMENTS) ×2 IMPLANT
NEEDLE HYPO 22GX1.5 SAFETY (NEEDLE) ×2 IMPLANT
NS IRRIG 500ML POUR BTL (IV SOLUTION) ×2 IMPLANT
PACK BASIN MINOR ARMC (MISCELLANEOUS) ×2 IMPLANT
SPONGE T-LAP 18X18 ~~LOC~~+RFID (SPONGE) ×2 IMPLANT
SUT MNCRL 4-0 (SUTURE) ×2
SUT MNCRL 4-0 27XMFL (SUTURE) ×1
SUT VIC AB 0 CT1 36 (SUTURE) ×1 IMPLANT
SUT VIC AB 2-0 CT2 27 (SUTURE) ×1 IMPLANT
SUTURE MNCRL 4-0 27XMF (SUTURE) ×1 IMPLANT
SYR 10ML LL (SYRINGE) ×2 IMPLANT
WATER STERILE IRR 500ML POUR (IV SOLUTION) ×1 IMPLANT

## 2022-01-30 NOTE — Anesthesia Postprocedure Evaluation (Signed)
Anesthesia Post Note  Patient: Krystal Blackwell  Procedure(s) Performed: EXCISION ARM MASS (Left)  Patient location during evaluation: PACU Anesthesia Type: General Level of consciousness: awake and alert Pain management: pain level controlled Vital Signs Assessment: post-procedure vital signs reviewed and stable Respiratory status: spontaneous breathing, nonlabored ventilation and respiratory function stable Cardiovascular status: blood pressure returned to baseline and stable Postop Assessment: no apparent nausea or vomiting Anesthetic complications: no   No notable events documented.   Last Vitals:  Vitals:   01/30/22 0900 01/30/22 0908  BP: 120/81 117/79  Pulse: 70 69  Resp: 13 18  Temp: (!) 36.1 C (!) 36.2 C  SpO2: 96% 99%    Last Pain:  Vitals:   01/30/22 0908  TempSrc: Temporal  PainSc: 0-No pain                 [place] Ouch

## 2022-01-30 NOTE — Transfer of Care (Signed)
Immediate Anesthesia Transfer of Care Note  Patient: Krystal Blackwell  Procedure(s) Performed: EXCISION ARM MASS (Left)  Patient Location: PACU  Anesthesia Type:General  Level of Consciousness: drowsy  Airway & Oxygen Therapy: Patient Spontanous Breathing and Patient connected to face mask oxygen  Post-op Assessment: Report given to RN and Post -op Vital signs reviewed and stable  Post vital signs: Reviewed and stable  Last Vitals:  Vitals Value Taken Time  BP 131/74   Temp    Pulse 77 01/30/22 0826  Resp 12 01/30/22 0826  SpO2 100 % 01/30/22 0826  Vitals shown include unvalidated device data.  Last Pain:  Vitals:   01/30/22 0611  TempSrc: Temporal  PainSc: 0-No pain         Complications: No notable events documented.

## 2022-01-30 NOTE — H&P (Signed)
Krystal Blackwell is an 41 y.o. female.        Chief Complaint  Patient presents with   Follow-up      Lipoma left arm      HPI: Krystal Blackwell is a 41 year old female well-known to me with history of left upper arm soft tissue mass.  She did have a CT scan that I personally reviewed showing evidence of a large intramuscular lipoma and some atypical fissures.  I also discussed with Dr. Posey Pronto from radiology whether or not an MRI will be helpful.  He really thinks is a lipoma but cannot rule out a sarcomatous. There is tiny nodule on medial aspect of lesion.       Past Medical History:  Diagnosis Date   Blood transfusion without reported diagnosis      [phone]   Depression     Diverticulitis     Diverticulosis     Ovarian cyst     Pelvic pain in female     Recurrent sinus infections     Scoliosis             Past Surgical History:  Procedure Laterality Date   NASAL SEPTUM SURGERY   11/2013   SPINE SURGERY   97,98,99,00    scoliosis           Family History  Problem Relation Age of Onset   Asthma Mother     Hypertension Mother     Miscarriages / [place] Mother     Alcohol abuse Father     Drug abuse Father     Pancreatic cancer Father     Arthritis Maternal Grandmother     Colon cancer Neg Hx     Stomach cancer Neg Hx     Esophageal cancer Neg Hx     Rectal cancer Neg Hx     Liver cancer Neg Hx        Social History:  reports that she quit smoking about 16 months ago. Her smoking use included cigarettes. She has never used smokeless tobacco. She reports that she does not currently use alcohol after a past usage of about 6.0 standard drinks per week. She reports that she does not use drugs.   Allergies:       Allergies  Allergen Reactions   Avelox [Moxifloxacin Hcl In Nacl] Swelling   Abilify [Aripiprazole] Swelling      Made jaw twitch      Medications reviewed.       ROS Full ROS performed and is otherwise negative other than what is stated in HPI      Physical Exam CONSTITUTIONAL: NAD. EYES: Pupils are equal, round, Sclera are non-icteric. EARS, NOSE, MOUTH AND THROAT:  The oral mucosa is pink and moist. Hearing is intact to voice. LYMPH NODES:  Lymph nodes in the neck are normal. RESPIRATORY:  Lungs are clear. There is normal respiratory effort, with equal breath sounds bilaterally, and without pathologic use of accessory muscles. CARDIOVASCULAR: Heart is regular without murmurs, gallops, or rubs. GI: The abdomen is  soft, nontender, and nondistended. There are no palpable masses. There is no hepatosplenomegaly. There are normal bowel sounds in all quadrants. GU: Rectal deferred.   MUSCULOSKELETAL: Normal muscle strength and tone. No cyanosis or edema.   SKIN: There is a large lipoma on the left upper arm measuring 10 x 8 cm mobile, no solid component. Soft. NEUROLOGIC: Motor and sensation is grossly normal. Cranial nerves are grossly intact. PSYCH:  Oriented to person, place  and time. Affect is normal.   Data Reviewed   I have personally reviewed the patient's imaging, laboratory findings and medical records.           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  I had a good discussion with her regarding her disease process.  Options of close follow-up with another imaging in 6 months versus referral to a tertiary center and a sarcoma team versus excision here in the OR were discussed with her in detail. All the risk and benefits of those decisions were discussed with the patient.  She wishes to have this excised here locally.Pt is here for excision  Procedure discussed with the patient in detail.  Risks, benefits and possible complications including but not limited to: Bleeding, infection positivity of margins, potential requirement for reexcision and transfer to a sarcoma center.  SHe understands and wishes to proceed    Caroleen Hamman, MD  [place] Surgeon

## 2022-01-30 NOTE — Discharge Instructions (Addendum)
Lipoma Removal, Care After The following information offers guidance on how to care for yourself after your procedure. Your health care provider may also give you more specific instructions. If you have problems or questions, contact your health care provider. What can I expect after the procedure? After the procedure, it is common to have: Mild pain. Swelling. Bruising. Follow these instructions at home: Bathing A sign showing that a person should not take a bath.   Do not take baths, swim, or use a hot tub until your health care provider approves. Ask your health care provider if you may take showers. You may only be allowed to take sponge baths. Keep your bandage (dressing) clean and dry until your health care provider says it can be removed. Incision care Two stitched incisions. One is normal. The other is red with pus and infected.   Follow instructions from your health care provider about how to take care of your incision. Make sure you: Wash your hands with soap and water for at least 20 seconds before and after you change your dressing. If soap and water are not available, use hand sanitizer. Change your dressing as told by your health care provider. Leave stitches (sutures), skin glue, or adhesive strips in place. These skin closures may need to stay in place for 2 weeks or longer. If adhesive strip edges start to loosen and curl up, you may trim the loose edges. Do not remove adhesive strips completely unless your health care provider tells you to do that. Check your incision area every day for signs of infection. Check for: More redness, swelling, or pain. Fluid or blood. Warmth. Pus or a bad smell. Medicines Take over-the-counter and prescription medicines only as told by your health care provider. If you were prescribed an antibiotic medicine, use it as told by your health care provider. Do not stop using the antibiotic even if you start to feel better. General  instructions A sign showing that a person should not drive.    If you were given a sedative during the procedure, it can affect you for several hours. Do not drive or operate machinery until your health care provider says that it is safe. Do not use any products that contain nicotine or tobacco before the procedure. These products include cigarettes, chewing tobacco, and vaping devices, such as e-cigarettes. These can delay healing after surgery. If you need help quitting, ask your health care provider. Return to your normal activities as told by your health care provider. Ask your health care provider what activities are safe for you. Keep all follow-up visits. This is important. Contact a health care provider if: You have more redness, swelling, or pain around your incision. You have fluid or blood coming from your incision. Your incision feels warm to the touch. You have pus or a bad smell coming from your incision. You have pain that does not get better with medicine. Get help right away if: You have chills or a fever. You have severe pain. Summary After the procedure, it is common to have mild pain, swelling, and bruising. Follow instructions from your health care provider about how to take care of your incision. Contact a health care provider if you have signs of infection such as more redness, swelling, or pain. This information is not intended to replace advice given to you by your health care provider. Make sure you discuss any questions you have with your health care provider. Document Revised: 08/23/2021 Document Reviewed: 08/23/2021 Elsevier Patient  Education  [place]   The drugs that you were given will stay in your system until tomorrow so for the next 24 hours you should not:  Drive an automobile Make any legal decisions Drink any alcoholic beverage   You may resume regular meals tomorrow.  Today it is  better to start with liquids and gradually work up to solid foods.  You may eat anything you prefer, but it is better to start with liquids, then soup and crackers, and gradually work up to solid foods.   Please notify your doctor immediately if you have any unusual bleeding, trouble breathing, redness and pain at the surgery site, drainage, fever, or pain not relieved by medication.    Additional Instructions:    PLEASE LEAVE GREEN ARMBAND ON FOR 4 DAYS     Please contact your physician with any problems or Same Day Surgery at [phone], Monday through Friday 6 am to 4 pm, or [place] at [facility] number at [phone].

## 2022-01-30 NOTE — Op Note (Signed)
  01/30/2022  8:29 AM  PATIENT:  Krystal Blackwell  41 y.o. female  PRE-OPERATIVE DIAGNOSIS: Left Arm mass  POST-OPERATIVE DIAGNOSIS:  Same  PROCEDURE:  1. Excision en bloque of 10 x 7 cm  Deep intramuscular Left arm mass.      SURGEON:  Surgeon(s) and Role:    * Jeren Dufrane F, MD - Primary  FINDINGS: Left upper arm mass measuring 10 x 7 cm consistent with lipoma  ANESTHESIA: LMA  EBL: 3 cc  DICTATION:  Patient was explained  about the procedure in detail, risks, benefits, possible complications and a consent was obtained. The patient taken to the operating room and placed in the supine position position with all pressure points padded.  The upper arm was prepped and draped in the usual sterile fashion. Incision was created and the subcutaneous tissue was dissected with electrocautery.  The fascia of the triceps muscles was open and we were able to see the mass.  Mass did not have any malignant component features.  It was not attached to the deep tissues and felt assistant with a lipoma.  I was able to detach it from the muscle using electrocautery. I marked the specimen with paint and send it for permanent pathology to make sure we were able to assess the margins. Grossly all the margins were clear.  The pedicle was suture-ligated. Exparel was injected within the subcutaneous tissue and muscle. Fascia was closed with a running 2-0 Vicryl subcutaneous tissue was also closed with 3-0 Vicryl and skin was closed with a 4-0 Monocryl in a subcuticular fashion.  Dermabond was applied  Needle and laparotomy counts were correct and there were no immediate complications  Jules Husbands, MD

## 2022-01-30 NOTE — Anesthesia Procedure Notes (Signed)
Procedure Name: LMA Insertion Date/Time: 01/30/2022 7:34 AM  Performed by: Lily Peer, Vanissa Strength, CRNAPre-anesthesia Checklist: Patient identified, Emergency Drugs available, Suction available and Patient being monitored Patient Re-evaluated:Patient Re-evaluated prior to induction Oxygen Delivery Method: Circle system utilized Preoxygenation: Pre-oxygenation with 100% oxygen Induction Type: IV induction Ventilation: Mask ventilation without difficulty LMA: LMA inserted LMA Size: 3.0 Number of attempts: 1 Placement Confirmation: positive ETCO2 and breath sounds checked- equal and bilateral Tube secured with: Tape Dental Injury: Teeth and Oropharynx as per pre-operative assessment

## 2022-01-31 LAB — SURGICAL PATHOLOGY

## 2022-02-01 ENCOUNTER — Other Ambulatory Visit: Payer: Self-pay

## 2022-02-01 ENCOUNTER — Emergency Department
Admission: EM | Admit: 2022-02-01 | Discharge: 2022-02-01 | Disposition: A | Discharge status: Home / Self Care | Payer: BC Managed Care – PPO | Attending: Emergency Medicine | Admitting: Emergency Medicine

## 2022-02-01 DIAGNOSIS — I10 Essential (primary) hypertension: Secondary | ICD-10-CM | POA: Insufficient documentation

## 2022-02-01 DIAGNOSIS — L03114 Cellulitis of left upper limb: Secondary | ICD-10-CM | POA: Insufficient documentation

## 2022-02-01 DIAGNOSIS — M7989 Other specified soft tissue disorders: Secondary | ICD-10-CM | POA: Diagnosis present

## 2022-02-01 LAB — CBC WITH DIFFERENTIAL/PLATELET
Abs Immature Granulocytes: 0.03 10*3/uL (ref 0.00–0.07)
Basophils Absolute: 0 10*3/uL (ref 0.0–0.1)
Basophils Relative: 1 %
Eosinophils Absolute: 0.1 10*3/uL (ref 0.0–0.5)
Eosinophils Relative: 1 %
HCT: 39 % (ref 36.0–46.0)
Hemoglobin: 12.7 g/dL (ref 12.0–15.0)
Immature Granulocytes: 0 %
Lymphocytes Relative: 33 %
Lymphs Abs: 2.5 10*3/uL (ref 0.7–4.0)
MCH: 30.4 pg (ref 26.0–34.0)
MCHC: 32.6 g/dL (ref 30.0–36.0)
MCV: 93.3 fL (ref 80.0–100.0)
Monocytes Absolute: 0.7 10*3/uL (ref 0.1–1.0)
Monocytes Relative: 9 %
Neutro Abs: 4.3 10*3/uL (ref 1.7–7.7)
Neutrophils Relative %: 56 %
Platelets: 389 10*3/uL (ref 150–400)
RBC: 4.18 MIL/uL (ref 3.87–5.11)
RDW: 12.5 % (ref 11.5–15.5)
WBC: 7.7 10*3/uL (ref 4.0–10.5)
nRBC: 0 % (ref 0.0–0.2)

## 2022-02-01 LAB — COMPREHENSIVE METABOLIC PANEL
ALT: 15 U/L (ref 0–44)
AST: 17 U/L (ref 15–41)
Albumin: 3.9 g/dL (ref 3.5–5.0)
Alkaline Phosphatase: 64 U/L (ref 38–126)
Anion gap: 9 (ref 5–15)
BUN: 11 mg/dL (ref 6–20)
CO2: 26 mmol/L (ref 22–32)
Calcium: 9.2 mg/dL (ref 8.9–10.3)
Chloride: 102 mmol/L (ref 98–111)
Creatinine, Ser: 0.68 mg/dL (ref 0.44–1.00)
GFR, Estimated: >60 mL/min (ref >60)
Glucose, Bld: 78 mg/dL (ref 70–99)
Potassium: 4 mmol/L (ref 3.5–5.1)
Sodium: 137 mmol/L (ref 135–145)
Total Bilirubin: 0.4 mg/dL (ref 0.3–1.2)
Total Protein: 7.8 g/dL (ref 6.5–8.1)

## 2022-02-01 LAB — URINALYSIS, ROUTINE W REFLEX MICROSCOPIC
Bilirubin Urine: NEGATIVE
Glucose, UA: NEGATIVE mg/dL
Hgb urine dipstick: NEGATIVE
Ketones, ur: NEGATIVE mg/dL
Leukocytes,Ua: NEGATIVE
Nitrite: NEGATIVE
Protein, ur: NEGATIVE mg/dL
Specific Gravity, Urine: 1.005 (ref 1.005–1.030)
pH: 7 (ref 5.0–8.0)

## 2022-02-01 LAB — LACTIC ACID, PLASMA: Lactic Acid, Venous: 1.5 mmol/L (ref 0.5–1.9)

## 2022-02-01 MED ORDER — HYDROCODONE-ACETAMINOPHEN 5-325 MG PO TABS
1.0000 | ORAL_TABLET | Freq: Four times a day (QID) | ORAL | 0 refills | Status: AC | PRN
Start: 2022-02-01 — End: 2022-02-04

## 2022-02-01 MED ORDER — SULFAMETHOXAZOLE-TRIMETHOPRIM 800-160 MG PO TABS
1.0000 | ORAL_TABLET | Freq: Once | ORAL | Status: AC
  Administered 2022-02-01: 1 via ORAL
  Filled 2022-02-01: qty 1

## 2022-02-01 MED ORDER — CEPHALEXIN 500 MG PO CAPS
500.0000 mg | ORAL_CAPSULE | Freq: Once | ORAL | Status: AC
  Administered 2022-02-01: 500 mg via ORAL
  Filled 2022-02-01: qty 1

## 2022-02-01 MED ORDER — SULFAMETHOXAZOLE-TRIMETHOPRIM 800-160 MG PO TABS: 1.0000 | ORAL_TABLET | Freq: Two times a day (BID) | ORAL | 0 refills | Status: DC

## 2022-02-01 MED ORDER — CEPHALEXIN 500 MG PO CAPS: 500.0000 mg | ORAL_CAPSULE | Freq: Three times a day (TID) | ORAL | 0 refills | Status: DC

## 2022-02-01 NOTE — ED Triage Notes (Signed)
Pt to ED for post op redness swelling and pain to L upper arm. Pt had surgery for lipoma 2 days ago. States arm feels hot up into shoulder and face. Incision is approximated but skin around is red and area is markedly swollen, no drainage noted. States has been applying ice packs. Last took hydrocodone/tylenol this morning at 0600, pain currently 8/10.  Pain and redness started today around lunchtime. States temp at home was 100.4. Temp here is 98.5, did not take antipyretics.

## 2022-02-01 NOTE — ED Provider Notes (Signed)
[facility] Provider Note    Event Date/Time   First MD Initiated Contact with Patient 02/01/22 1623     (approximate)   History   Chief Complaint: Post-op Problem   HPI  Krystal Blackwell is a 41 y.o. female with a past history of diverticulosis, hypertension, bipolar disorder, currently breast-feeding 97-monthold infant who comes the ED complaining of worsening pain swelling redness in her left shoulder where she had a lipoma resection 2 days ago.  I reviewed outside record note from Dr. PDahlia Byes noting 10 x 7 cm mass consistent with lipoma removed without complication.  Patient reports that pain had been improving, but today seems to be worsening, currently 8/10, nonradiating.  No chest pain or shortness of breath.  She did have a fever of 100.4 at home.     Physical Exam   Triage Vital Signs: ED Triage Vitals  Enc Vitals Group     BP 02/01/22 1535 (!) 132/94     Pulse Rate 02/01/22 1535 94     Resp 02/01/22 1535 16     Temp 02/01/22 1535 98.5 F (36.9 C)     Temp Source 02/01/22 1535 Oral     SpO2 02/01/22 1534 99 %     Weight 02/01/22 1536 175 lb (79.4 kg)     Height 02/01/22 1536 '5\' 1"'$  (1.549 m)     Head Circumference --      Peak Flow --      Pain Score 02/01/22 1536 8     Pain Loc --      Pain Edu? --      Excl. in [place] --     Most recent vital signs: Vitals:   02/01/22 1535 02/01/22 1752  BP: (!) 132/94 128/88  Pulse: 94 76  Resp: 16 16  Temp: 98.5 F (36.9 C)   SpO2: 93% 100%    General: Awake, no distress.  CV:  Good peripheral perfusion.  Normal peripheral pulses.  Regular rate and rhythm Resp:  Normal effort.  Clear to auscultation bilateral Abd:  No distention.  Other:  Full range of motion.  There is well demarcated erythema with warmth and induration and exquisite tenderness in an area expanding radially from her left lateral shoulder surgical incision, extending about 2 cm in each direction.  No crepitus.  No  fluctuance or purulent drainage.  Surgical incision closure is intact   ED Results / Procedures / Treatments   Labs (all labs ordered are listed, but only abnormal results are displayed) Labs Reviewed  URINALYSIS, ROUTINE W REFLEX MICROSCOPIC - Abnormal; Notable for the following components:      Result Value   Color, Urine STRAW (*)    APPearance CLEAR (*)    Bacteria, UA RARE (*)    All other components within normal limits  LACTIC ACID, PLASMA  COMPREHENSIVE METABOLIC PANEL  CBC WITH DIFFERENTIAL/PLATELET  LACTIC ACID, PLASMA  POC URINE PREG, ED     EKG    RADIOLOGY    PROCEDURES:  Procedures   MEDICATIONS ORDERED IN ED: Medications  sulfamethoxazole-trimethoprim (BACTRIM DS) 800-160 MG per tablet 1 tablet (1 tablet Oral Given 02/01/22 1750)  cephALEXin (KEFLEX) capsule 500 mg (500 mg Oral Given 02/01/22 1750)     IMPRESSION / MDM / ASSESSMENT AND PLAN / ED COURSE  I reviewed the triage vital signs and the nursing notes.  Patient's presentation is most consistent with acute complicated illness / injury requiring diagnostic workup.  Patient presents with worsening symptoms of the left shoulder where she had recent soft tissue resection.  Not septic.  Doubt abscess osteomyelitis septic arthritis or necrotizing fasciitis.  She has clinically apparent cellulitis developing in the area.  Vitals are normal, labs are normal, no leukocytosis, normal lactic acid.  He is nontoxic.  We will start her on Keflex and Bactrim.  She is also taking hydrocodone since surgery and reports good pain relief with this.  Today is Saturday of a 3-day weekend, and I will provide a prescription for additional hydrocodone to manage her pain until she is able to follow-up in surgery clinic.       FINAL CLINICAL IMPRESSION(S) / ED DIAGNOSES   Final diagnoses:  Cellulitis of left upper extremity     Rx / DC Orders   ED Discharge Orders           Ordered    cephALEXin (KEFLEX) 500 MG capsule  3 times daily        02/01/22 1759    sulfamethoxazole-trimethoprim (BACTRIM DS) 800-160 MG tablet  2 times daily        02/01/22 1759    HYDROcodone-acetaminophen (NORCO) 5-325 MG tablet  Every 6 hours PRN        02/01/22 1800             Note:  This document was prepared using Dragon voice recognition software and may include unintentional dictation errors.   Carrie Mew, MD 02/01/22 [phone]

## 2022-02-03 ENCOUNTER — Telehealth: Payer: Self-pay

## 2022-02-03 NOTE — Telephone Encounter (Signed)
Patient notified that per Dr Dahlia Byes the mass removed was just a lipoma and nothing to worry about. Patient pleased and will be seen for follow up as scheduled.

## 2022-02-05 ENCOUNTER — Telehealth: Payer: Self-pay | Admitting: Obstetrics and Gynecology

## 2022-02-05 NOTE — Telephone Encounter (Signed)
Patient called and states that her son was in a tragic car accident. she called her primary care to see if she could get something for anxiety. Pt states that PCP said she is still under post partum care and should speak with the doctor here. Informed patient that I would send a message back to provider but provider may want a in office visit before prescribing medication. Please advise.

## 2022-02-05 NOTE — Telephone Encounter (Signed)
Called patient to inform her that Dr. Marcelline Mates was not in office and would not return until the 28th. I asked her if she could wait or if she wanted to see another provider. She said she could wait, but if someone else could see her she wanted to be seen. Krystal Blackwell is looking for a slot and will call patient back to inform her of scheduled visit.

## 2022-02-13 ENCOUNTER — Encounter: Payer: BC Managed Care – PPO | Admitting: Physician Assistant

## 2022-02-13 ENCOUNTER — Other Ambulatory Visit: Payer: Self-pay | Admitting: Obstetrics and Gynecology

## 2022-02-13 ENCOUNTER — Telehealth: Payer: Self-pay | Admitting: Obstetrics and Gynecology

## 2022-02-13 MED ORDER — DULOXETINE HCL 20 MG PO CPEP: 60.0000 mg | ORAL_CAPSULE | Freq: Every day | ORAL | 0 refills | Status: AC

## 2022-02-13 NOTE — Telephone Encounter (Signed)
Can give 30 day supply until her PCP returns. Still need to see her since we weren't able to get her seen for her last scheduled appointment (I believe I had a surgery or delivery that day). Please ensure that she has been rescheduled for her annual.

## 2022-02-13 NOTE — Telephone Encounter (Signed)
Pt states "her [place]CP is [place] of the country" . Pt states her Cymbalta is out , she is asking if we can refill it or if she would need to have Dr. Marcelline Mates see her prior. Please advise.

## 2022-02-13 NOTE — Telephone Encounter (Signed)
Called patient. Patient aware.

## 2022-02-25 ENCOUNTER — Encounter: Payer: BC Managed Care – PPO | Admitting: Physician Assistant

## 2022-05-27 ENCOUNTER — Encounter: Payer: Self-pay | Admitting: Obstetrics and Gynecology

## 2022-06-04 ENCOUNTER — Encounter: Payer: Self-pay | Admitting: Obstetrics and Gynecology

## 2022-12-29 ENCOUNTER — Ambulatory Visit
Admission: EM | Admit: 2022-12-29 | Discharge: 2022-12-29 | Disposition: A | Discharge status: Home / Self Care | Payer: BC Managed Care – PPO | Attending: Family Medicine | Admitting: Family Medicine

## 2022-12-29 DIAGNOSIS — J020 Streptococcal pharyngitis: Secondary | ICD-10-CM | POA: Diagnosis present

## 2022-12-29 LAB — GROUP A STREP BY PCR: Group A Strep by PCR: DETECTED — AB

## 2022-12-29 MED ORDER — AMOXICILLIN-POT CLAVULANATE 875-125 MG PO TABS: 1.0000 | ORAL_TABLET | Freq: Two times a day (BID) | ORAL | 0 refills | Status: AC

## 2022-12-29 NOTE — Discharge Instructions (Signed)
Stop by the pharmacy to pick up your prescriptions.  Follow up with your primary care provider as needed.  

## 2022-12-29 NOTE — ED Triage Notes (Signed)
Pt c/o sore throat,congestion & chills x2 days. Denies any fevers, has tried ibuprofen,tylenol & nasal spray w/o relief.

## 2022-12-29 NOTE — ED Provider Notes (Signed)
[facility]    CSN: [id number] Arrival date & time: 12/29/22  2952      History   Chief Complaint Chief Complaint  Patient presents with   Sore Throat   Chills        Congestion    HPI Krystal Blackwell is a 42 y.o. female.   HPI   Krystal Blackwell presents for sore throat, nasal congestion for the past 2 days. Has chills but no fever. Taking Tylenol, Motrin and nasal spray without relief. It hurts to turn her neck. She has swollen lymph nodes. Her daughter had strep last week.  Has headache and nasal congestion. Has slight cough and chest congestion.     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           Home Medications    Prior to Admission medications   Medication Sig Start Date End Date Taking? Authorizing Provider  amoxicillin-clavulanate (AUGMENTIN) 875-125 MG tablet Take 1 tablet by mouth every 12 (twelve) hours. 12/29/22  Yes Roshard Rezabek, DO  DULoxetine (CYMBALTA)  20 MG capsule Take 3 capsules (60 mg total) by mouth daily. 02/13/22 12/29/22 Yes Hildred Laser, MD  hydrochlorothiazide (HYDRODIURIL) 25 MG tablet Take 25 mg by mouth daily. 12/26/21  Yes [provider]  hydrochlorothiazide (MICROZIDE) 12.5 MG capsule Take by mouth. Patient not taking: Reported on 02/01/2022 05/06/21   [provider]  HYDROcodone-acetaminophen (NORCO/VICODIN) 5-325 MG tablet Take 1-2 tablets by mouth every 6 (six) hours as needed for moderate pain. 01/30/22   Leafy Ro, MD    Family History Family History  Problem Relation Age of Onset   Asthma Mother    Hypertension Mother    Miscarriages / [place] Mother    Alcohol abuse Father    Drug abuse Father    Pancreatic cancer Father    Arthritis Maternal Grandmother    Colon cancer Neg Hx    Stomach cancer Neg Hx    Esophageal cancer Neg Hx    Rectal cancer Neg Hx    Liver cancer Neg Hx     Social History Social History   Tobacco Use  Smoking status: Former    Types: Cigarettes    Quit date: 2022    Years since quitting: 2.3   Smokeless tobacco: Never   Tobacco comments:    2-3 CIGARETTES EVERY TWO WEEKS  Vaping Use   Vaping Use: Never used  Substance Use Topics   Alcohol use: Not Currently    Alcohol/week: 6.0 standard drinks of alcohol    Types: 6 Cans of beer per week   Drug use: No     Allergies   Avelox [moxifloxacin hcl in nacl], Moxifloxacin hcl, and Aripiprazole   Review of Systems Review of Systems: negative unless otherwise stated in HPI.      Physical Exam Triage Vital Signs ED Triage Vitals  Enc Vitals Group     BP 12/29/22 1141 (!) 137/90     Pulse Rate 12/29/22 1141 (!) 104     Resp 12/29/22 1141 16     Temp 12/29/22 1141 98.6 F (37 C)     Temp Source 12/29/22 1141 Oral     SpO2 12/29/22 1141 100 %     Weight 12/29/22 1140 180 lb (81.6 kg)     Height 12/29/22 1140 5' (1.524 m)     Head Circumference --      Peak Flow --      Pain Score 12/29/22  1143 8     Pain Loc --      Pain Edu? --      Excl. in GC? --    No data found.  Updated Vital Signs BP (!) 132/90   Pulse 90   Temp 98.6 F (37 C) (Oral)   Resp 16   Ht 5' (1.524 m)   Wt 81.6 kg   SpO2 100%   BMI 35.15 kg/m   Visual Acuity Right Eye Distance:   Left Eye Distance:   Bilateral Distance:    Right Eye Near:   Left Eye Near:    Bilateral Near:     Physical Exam GEN:     alert, non-toxic appearing female in no distress    HENT:  mucus membranes moist, oropharyngeal without lesions, moderate erythema, no tonsills visible, no nasal discharge, bilateral TM erythema  EYES:   pupils equal and reactive, no scleral injection or discharge NECK:  normal ROM, bilateral anterior lymphadenopathy, no meningismus   RESP:  no increased work of breathing, clear to auscultation bilaterally CVS:   regular rate and rhythm Skin:   warm and dry, no rash on visible skin    UC Treatments / Results  Labs (all labs ordered are listed, but only abnormal results are displayed) Labs Reviewed  GROUP A STREP BY PCR - Abnormal; Notable for the following components:      Result Value   Group A Strep by PCR DETECTED (*)    All other components within normal limits    EKG   Radiology No results found.  Procedures Procedures (including critical care time)  Medications Ordered in UC Medications - No data to display  Initial Impression / Assessment and Plan / UC Course  I have reviewed the triage vital signs and the nursing notes.  Pertinent labs & imaging results that were available during my care of the patient were reviewed by me and considered in my medical decision making (see chart for details).       Pt is a 42 y.o. female who presents for 2 days of respiratory symptoms. [name] is afebrile here. Satting well on room air. Overall pt  is ill but non-toxic appearing, well hydrated, without respiratory distress. Pulmonary exam is unremarkable. Strep PCR is positive.   Discussed symptomatic treatment. Treat with Augmentin as below.  Typical duration of symptoms discussed.   Return and ED precautions given and voiced understanding. Discussed MDM, treatment plan and plan for follow-up with patient who agrees with plan.     Final Clinical Impressions(s) / UC Diagnoses   Final diagnoses:  Strep pharyngitis     Discharge Instructions      Stop by the pharmacy to pick up your prescriptions.  Follow up with your primary care provider as needed.      ED Prescriptions     Medication Sig Dispense Auth. Provider   amoxicillin-clavulanate (AUGMENTIN) 875-125 MG tablet Take 1 tablet by mouth every 12 (twelve) hours. 20 tablet Katha Cabal, DO      PDMP not reviewed this encounter.   Katha Cabal, DO 12/29/22 1317

## 2023-01-13 ENCOUNTER — Ambulatory Visit: Payer: BC Managed Care – PPO | Admitting: Certified Nurse Midwife

## 2023-01-15 ENCOUNTER — Other Ambulatory Visit: Payer: Self-pay | Admitting: Student

## 2023-01-15 DIAGNOSIS — Z1231 Encounter for screening mammogram for malignant neoplasm of breast: Secondary | ICD-10-CM

## 2023-03-04 NOTE — Progress Notes (Deleted)
Carren Rang, PA-C   No chief complaint on file.   HPI:      [name] is a 42 y.o. [id number] whose LMP was No LMP recorded. 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   Vitamin D deficiency 06/19/2014   Depression with anxiety     Past Surgical History:  Procedure Laterality Date   NASAL SEPTUM SURGERY  11/2013   SPINE SURGERY  97,98,99,00   scoliosis    Family History  Problem Relation Age of Onset   Asthma Mother    Hypertension Mother    Miscarriages / [place] Mother    Alcohol abuse Father    Drug abuse Father    Pancreatic cancer Father    Arthritis Maternal Grandmother    Colon cancer Neg Hx    Stomach cancer Neg Hx    Esophageal cancer Neg Hx    Rectal cancer Neg Hx    Liver cancer Neg Hx     Social History   Socioeconomic History   Marital status: Divorced    Spouse name: Not on file   Number of children: 2   Years of education: Not on file   Highest education level: Not on file  Occupational History   Occupation: EC Teacher  Tobacco Use   Smoking status: Former    Current packs/day: 0.00    Types: Cigarettes    Quit date: 2022    Years since quitting: 2.5   Smokeless tobacco: Never   Tobacco comments:    2-3 CIGARETTES EVERY TWO WEEKS  Vaping Use   Vaping status: Never Used  Substance and Sexual Activity   Alcohol use: Not Currently    Alcohol/week: 6.0 standard drinks of alcohol    Types: 6 Cans of beer per week   Drug use: No   Sexual activity: Not Currently    Birth control/protection: None  Other Topics Concern   Not on file  Social History Narrative   Entered 06/2014:    Has 2 children--both are boys--one is 15 y/o. The other is "teenager"--not sure exact age.   At home --it is her and her 2 sons.   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   Social Determinants of Health   Financial Resource Strain: Medium Risk (01/14/2023)   Received from [facility], [facility]   Overall Financial Resource Strain (CARDIA)    Difficulty of Paying Living Expenses: Somewhat hard  Food Insecurity: Food Insecurity Present (01/14/2023)   Received from [facility], [facility]   Hunger Vital Sign    Worried About Running Out of Food in the Last Year: Sometimes true    Ran Out of Food in the Last Year: Sometimes true  Transportation Needs: No Transportation Needs (01/14/2023)   Received from [facility], [facility]   [facility] - Transportation    In the past 12 months, has lack of transportation kept you from medical appointments or from getting medications?: No    Lack of Transportation (Non-Medical): No  Physical Activity: Not on file  Stress: Not on  file  Social Connections: Not on file  Intimate Partner Violence: Not on file    Outpatient Medications Prior to Visit  Medication Sig Dispense Refill   amoxicillin-clavulanate (AUGMENTIN) 875-125 MG tablet Take 1 tablet by mouth every 12 (twelve) hours. 20 tablet 0   DULoxetine (CYMBALTA) 20 MG capsule Take 3 capsules (60 mg total) by mouth daily. 90 capsule 0   hydrochlorothiazide (HYDRODIURIL) 25 MG tablet Take 25 mg by mouth daily.     hydrochlorothiazide (MICROZIDE) 12.5 MG capsule Take by mouth. (Patient not taking: Reported on 02/01/2022)     HYDROcodone-acetaminophen (NORCO/VICODIN) 5-325 MG tablet Take 1-2 tablets by mouth every 6 (six) hours as needed for moderate pain. 12 tablet 0   No facility-administered medications prior to visit.      ROS:  Review of Systems BREAST: No  symptoms   OBJECTIVE:   Vitals:  There were no vitals taken for this visit.  Physical Exam  Results: No results found for this or any previous visit (from the past 24 hour(s)).   Assessment/Plan: No diagnosis found.    No orders of the defined types were placed in this encounter.     No follow-ups on file.   B. , PA-C 03/04/2023 7:22 PM

## 2023-03-05 ENCOUNTER — Ambulatory Visit: Payer: BC Managed Care – PPO | Admitting: Obstetrics and Gynecology

## 2024-06-08 ENCOUNTER — Other Ambulatory Visit: Payer: Self-pay | Admitting: Obstetrics and Gynecology

## 2024-06-08 DIAGNOSIS — N644 Mastodynia: Secondary | ICD-10-CM

## 2024-06-08 DIAGNOSIS — Z01419 Encounter for gynecological examination (general) (routine) without abnormal findings: Secondary | ICD-10-CM

## 2024-06-13 ENCOUNTER — Ambulatory Visit
Admission: RE | Admit: 2024-06-13 | Discharge: 2024-06-13 | Disposition: A | Discharge status: Home / Self Care | Payer: Self-pay | Source: Ambulatory Visit | Attending: Obstetrics and Gynecology | Admitting: Obstetrics and Gynecology

## 2024-06-13 DIAGNOSIS — Z01419 Encounter for gynecological examination (general) (routine) without abnormal findings: Secondary | ICD-10-CM | POA: Insufficient documentation

## 2024-06-13 DIAGNOSIS — N644 Mastodynia: Secondary | ICD-10-CM | POA: Diagnosis present
